# Patient Record
Sex: Male | Born: 1955 | Race: Black or African American | Hispanic: No | Marital: Married | Smoking: Former smoker
Health system: Southern US, Community
[De-identification: ages and names within clinical notes are randomized; demographics above are authoritative.]

## PROBLEM LIST (undated history)

## (undated) DIAGNOSIS — G629 Polyneuropathy, unspecified: Secondary | ICD-10-CM

## (undated) DIAGNOSIS — G4710 Hypersomnia, unspecified: Secondary | ICD-10-CM

## (undated) DIAGNOSIS — E669 Obesity, unspecified: Secondary | ICD-10-CM

## (undated) DIAGNOSIS — R7301 Impaired fasting glucose: Secondary | ICD-10-CM

## (undated) DIAGNOSIS — F528 Other sexual dysfunction not due to a substance or known physiological condition: Secondary | ICD-10-CM

## (undated) DIAGNOSIS — IMO0002 Reserved for concepts with insufficient information to code with codable children: Secondary | ICD-10-CM

## (undated) DIAGNOSIS — M25569 Pain in unspecified knee: Secondary | ICD-10-CM

## (undated) DIAGNOSIS — E739 Lactose intolerance, unspecified: Secondary | ICD-10-CM

## (undated) DIAGNOSIS — R509 Fever, unspecified: Secondary | ICD-10-CM

## (undated) DIAGNOSIS — K649 Unspecified hemorrhoids: Secondary | ICD-10-CM

## (undated) DIAGNOSIS — M549 Dorsalgia, unspecified: Secondary | ICD-10-CM

## (undated) DIAGNOSIS — M545 Low back pain: Secondary | ICD-10-CM

## (undated) DIAGNOSIS — K219 Gastro-esophageal reflux disease without esophagitis: Secondary | ICD-10-CM

## (undated) DIAGNOSIS — K602 Anal fissure, unspecified: Secondary | ICD-10-CM

## (undated) DIAGNOSIS — E785 Hyperlipidemia, unspecified: Secondary | ICD-10-CM

## (undated) DIAGNOSIS — R55 Syncope and collapse: Secondary | ICD-10-CM

## (undated) DIAGNOSIS — M109 Gout, unspecified: Secondary | ICD-10-CM

## (undated) DIAGNOSIS — I10 Essential (primary) hypertension: Secondary | ICD-10-CM

## (undated) DIAGNOSIS — K5790 Diverticulosis of intestine, part unspecified, without perforation or abscess without bleeding: Secondary | ICD-10-CM

## (undated) DIAGNOSIS — G4730 Sleep apnea, unspecified: Secondary | ICD-10-CM

## (undated) DIAGNOSIS — L03317 Cellulitis of buttock: Secondary | ICD-10-CM

## (undated) DIAGNOSIS — L0231 Cutaneous abscess of buttock: Secondary | ICD-10-CM

## (undated) HISTORY — DX: Other sexual dysfunction not due to a substance or known physiological condition: F52.8

## (undated) HISTORY — DX: Low back pain: M54.5

## (undated) HISTORY — DX: Cellulitis of buttock: L03.317

## (undated) HISTORY — DX: Hypersomnia, unspecified: G47.10

## (undated) HISTORY — DX: Lactose intolerance, unspecified: E73.9

## (undated) HISTORY — DX: Syncope and collapse: R55

## (undated) HISTORY — PX: COLONOSCOPY: SHX174

## (undated) HISTORY — DX: Essential (primary) hypertension: I10

## (undated) HISTORY — DX: Reserved for concepts with insufficient information to code with codable children: IMO0002

## (undated) HISTORY — DX: Hyperlipidemia, unspecified: E78.5

## (undated) HISTORY — DX: Fever, unspecified: R50.9

## (undated) HISTORY — PX: HEMORRHOID SURGERY: SHX153

## (undated) HISTORY — DX: Cutaneous abscess of buttock: L02.31

## (undated) HISTORY — DX: Unspecified hemorrhoids: K64.9

## (undated) HISTORY — DX: Dorsalgia, unspecified: M54.9

## (undated) HISTORY — DX: Obesity, unspecified: E66.9

## (undated) HISTORY — DX: Gastro-esophageal reflux disease without esophagitis: K21.9

## (undated) HISTORY — DX: Pain in unspecified knee: M25.569

## (undated) HISTORY — DX: Anal fissure, unspecified: K60.2

## (undated) HISTORY — DX: Gout, unspecified: M10.9

## (undated) HISTORY — DX: Polyneuropathy, unspecified: G62.9

## (undated) HISTORY — DX: Sleep apnea, unspecified: G47.30

## (undated) HISTORY — PX: OTHER SURGICAL HISTORY: SHX169

## (undated) HISTORY — DX: Diverticulosis of intestine, part unspecified, without perforation or abscess without bleeding: K57.90

## (undated) HISTORY — DX: Impaired fasting glucose: R73.01

---

## 1998-05-03 ENCOUNTER — Ambulatory Visit (HOSPITAL_COMMUNITY): Admission: RE | Admit: 1998-05-03 | Discharge: 1998-05-03 | Payer: Self-pay | Admitting: Family Medicine

## 1998-05-03 ENCOUNTER — Encounter: Payer: Self-pay | Admitting: Family Medicine

## 1998-09-06 ENCOUNTER — Ambulatory Visit (HOSPITAL_BASED_OUTPATIENT_CLINIC_OR_DEPARTMENT_OTHER): Admission: RE | Admit: 1998-09-06 | Discharge: 1998-09-06 | Payer: Self-pay | Admitting: Surgery

## 1998-11-01 ENCOUNTER — Ambulatory Visit (HOSPITAL_BASED_OUTPATIENT_CLINIC_OR_DEPARTMENT_OTHER): Admission: RE | Admit: 1998-11-01 | Discharge: 1998-11-01 | Payer: Self-pay | Admitting: Surgery

## 2000-03-09 ENCOUNTER — Encounter: Payer: Self-pay | Admitting: *Deleted

## 2000-03-09 ENCOUNTER — Inpatient Hospital Stay (HOSPITAL_COMMUNITY): Admission: EM | Admit: 2000-03-09 | Discharge: 2000-03-10 | Payer: Self-pay | Admitting: Emergency Medicine

## 2000-03-10 ENCOUNTER — Encounter: Payer: Self-pay | Admitting: *Deleted

## 2002-05-24 ENCOUNTER — Encounter: Payer: Self-pay | Admitting: Family Medicine

## 2002-05-24 ENCOUNTER — Encounter: Admission: RE | Admit: 2002-05-24 | Discharge: 2002-05-24 | Payer: Self-pay | Admitting: Family Medicine

## 2003-08-08 LAB — HM COLONOSCOPY

## 2003-10-12 ENCOUNTER — Ambulatory Visit (HOSPITAL_COMMUNITY): Admission: RE | Admit: 2003-10-12 | Discharge: 2003-10-12 | Payer: Self-pay | Admitting: Gastroenterology

## 2004-10-28 ENCOUNTER — Ambulatory Visit: Payer: Self-pay | Admitting: Internal Medicine

## 2004-10-30 ENCOUNTER — Ambulatory Visit: Payer: Self-pay | Admitting: Internal Medicine

## 2005-04-18 ENCOUNTER — Ambulatory Visit: Payer: Self-pay | Admitting: Internal Medicine

## 2005-05-07 ENCOUNTER — Ambulatory Visit: Payer: Self-pay | Admitting: Internal Medicine

## 2005-05-13 ENCOUNTER — Ambulatory Visit: Payer: Self-pay | Admitting: Internal Medicine

## 2005-09-01 ENCOUNTER — Ambulatory Visit: Payer: Self-pay | Admitting: Internal Medicine

## 2005-09-02 ENCOUNTER — Ambulatory Visit (HOSPITAL_COMMUNITY): Admission: RE | Admit: 2005-09-02 | Discharge: 2005-09-02 | Payer: Self-pay | Admitting: Internal Medicine

## 2005-12-15 ENCOUNTER — Ambulatory Visit: Payer: Self-pay | Admitting: Internal Medicine

## 2005-12-15 LAB — CONVERTED CEMR LAB
ALT: 24 units/L (ref 0–40)
AST: 28 units/L (ref 0–37)
Albumin: 4.1 g/dL (ref 3.5–5.2)
Alkaline Phosphatase: 66 units/L (ref 39–117)
BUN: 17 mg/dL (ref 6–23)
Basophils Absolute: 0.3 10*3/uL — ABNORMAL HIGH (ref 0.0–0.1)
Basophils Relative: 3.3 % — ABNORMAL HIGH (ref 0.0–1.0)
Bilirubin Urine: NEGATIVE
CO2: 30 meq/L (ref 19–32)
Calcium: 9.5 mg/dL (ref 8.4–10.5)
Chloride: 102 meq/L (ref 96–112)
Chol/HDL Ratio, serum: 3.9
Cholesterol: 211 mg/dL (ref 0–200)
Creatinine, Ser: 1.1 mg/dL (ref 0.4–1.5)
Eosinophil percent: 1.9 % (ref 0.0–5.0)
GFR calc non Af Amer: 76 mL/min
Glomerular Filtration Rate, Af Am: 92 mL/min/{1.73_m2}
Glucose, Bld: 114 mg/dL — ABNORMAL HIGH (ref 70–99)
HCT: 39.9 % (ref 39.0–52.0)
HDL: 54.7 mg/dL (ref >39.0)
Hemoglobin, Urine: NEGATIVE
Hemoglobin: 13.8 g/dL (ref 13.0–17.0)
Hgb A1c MFr Bld: 4.7 % (ref 4.6–6.0)
Ketones, ur: NEGATIVE mg/dL
LDL DIRECT: 124 mg/dL
Leukocytes, UA: NEGATIVE
Lymphocytes Relative: 26.7 % (ref 12.0–46.0)
MCHC: 34.6 g/dL (ref 30.0–36.0)
MCV: 99.3 fL (ref 78.0–100.0)
Monocytes Absolute: 0.5 10*3/uL (ref 0.2–0.7)
Monocytes Relative: 6.6 % (ref 3.0–11.0)
Neutro Abs: 5.1 10*3/uL (ref 1.4–7.7)
Neutrophils Relative %: 61.5 % (ref 43.0–77.0)
Nitrite: NEGATIVE
PSA: 0.71 ng/mL
PSA: 0.71 ng/mL (ref 0.10–4.00)
Platelets: 243 10*3/uL (ref 150–400)
Potassium: 4.1 meq/L (ref 3.5–5.1)
RBC: 4.02 M/uL — ABNORMAL LOW (ref 4.22–5.81)
RDW: 11.7 % (ref 11.5–14.6)
Sodium: 138 meq/L (ref 135–145)
Specific Gravity, Urine: 1.01 (ref 1.000–1.03)
TSH: 2.08 microintl units/mL (ref 0.35–5.50)
Total Bilirubin: 1.4 mg/dL — ABNORMAL HIGH (ref 0.3–1.2)
Total Protein, Urine: NEGATIVE mg/dL
Total Protein: 6.5 g/dL (ref 6.0–8.3)
Triglyceride fasting, serum: 221 mg/dL (ref 0–149)
Urine Glucose: NEGATIVE mg/dL
Urobilinogen, UA: 0.2 (ref 0.0–1.0)
VLDL: 44 mg/dL — ABNORMAL HIGH (ref 0–40)
WBC: 8.3 10*3/uL (ref 4.5–10.5)
pH: 7 (ref 5.0–8.0)

## 2006-09-29 ENCOUNTER — Encounter: Payer: Self-pay | Admitting: Internal Medicine

## 2006-09-29 DIAGNOSIS — E785 Hyperlipidemia, unspecified: Secondary | ICD-10-CM | POA: Insufficient documentation

## 2006-09-29 DIAGNOSIS — IMO0002 Reserved for concepts with insufficient information to code with codable children: Secondary | ICD-10-CM | POA: Insufficient documentation

## 2006-09-29 DIAGNOSIS — K219 Gastro-esophageal reflux disease without esophagitis: Secondary | ICD-10-CM | POA: Insufficient documentation

## 2006-09-29 DIAGNOSIS — I10 Essential (primary) hypertension: Secondary | ICD-10-CM | POA: Insufficient documentation

## 2006-09-29 DIAGNOSIS — E669 Obesity, unspecified: Secondary | ICD-10-CM | POA: Insufficient documentation

## 2006-09-29 DIAGNOSIS — K649 Unspecified hemorrhoids: Secondary | ICD-10-CM | POA: Insufficient documentation

## 2006-09-29 HISTORY — DX: Hyperlipidemia, unspecified: E78.5

## 2006-09-29 HISTORY — DX: Unspecified hemorrhoids: K64.9

## 2006-09-29 HISTORY — DX: Gastro-esophageal reflux disease without esophagitis: K21.9

## 2006-09-29 HISTORY — DX: Obesity, unspecified: E66.9

## 2006-09-29 HISTORY — DX: Reserved for concepts with insufficient information to code with codable children: IMO0002

## 2006-09-29 HISTORY — DX: Essential (primary) hypertension: I10

## 2006-12-31 ENCOUNTER — Ambulatory Visit: Payer: Self-pay | Admitting: Internal Medicine

## 2006-12-31 DIAGNOSIS — M545 Low back pain, unspecified: Secondary | ICD-10-CM

## 2006-12-31 DIAGNOSIS — E739 Lactose intolerance, unspecified: Secondary | ICD-10-CM | POA: Insufficient documentation

## 2006-12-31 DIAGNOSIS — F528 Other sexual dysfunction not due to a substance or known physiological condition: Secondary | ICD-10-CM

## 2006-12-31 HISTORY — DX: Low back pain: M54.5

## 2006-12-31 HISTORY — DX: Other sexual dysfunction not due to a substance or known physiological condition: F52.8

## 2006-12-31 HISTORY — DX: Lactose intolerance, unspecified: E73.9

## 2006-12-31 HISTORY — DX: Low back pain, unspecified: M54.50

## 2007-01-01 ENCOUNTER — Ambulatory Visit: Payer: Self-pay | Admitting: Internal Medicine

## 2007-01-01 DIAGNOSIS — R7301 Impaired fasting glucose: Secondary | ICD-10-CM | POA: Insufficient documentation

## 2007-01-01 HISTORY — DX: Impaired fasting glucose: R73.01

## 2007-01-01 LAB — CONVERTED CEMR LAB
Albumin: 4.2 g/dL (ref 3.5–5.2)
Alkaline Phosphatase: 51 units/L (ref 39–117)
BUN: 14 mg/dL (ref 6–23)
Basophils Absolute: 0 10*3/uL (ref 0.0–0.1)
Bilirubin Urine: NEGATIVE
Cholesterol: 229 mg/dL (ref 0–200)
Direct LDL: 155.8 mg/dL
Eosinophils Absolute: 0.2 10*3/uL (ref 0.0–0.6)
GFR calc Af Amer: 92 mL/min
HDL: 48 mg/dL (ref >39.0)
Hemoglobin, Urine: NEGATIVE
Hemoglobin: 13.6 g/dL (ref 13.0–17.0)
Leukocytes, UA: NEGATIVE
Lymphocytes Relative: 35 % (ref 12.0–46.0)
MCHC: 34.7 g/dL (ref 30.0–36.0)
MCV: 98.6 fL (ref 78.0–100.0)
Monocytes Absolute: 0.5 10*3/uL (ref 0.2–0.7)
Monocytes Relative: 8.5 % (ref 3.0–11.0)
Neutro Abs: 3.2 10*3/uL (ref 1.4–7.7)
Neutrophils Relative %: 52.3 % (ref 43.0–77.0)
PSA: 0.79 ng/mL (ref 0.10–4.00)
Potassium: 3.7 meq/L (ref 3.5–5.1)
Sodium: 141 meq/L (ref 135–145)
TSH: 1.53 microintl units/mL (ref 0.35–5.50)
VLDL: 31 mg/dL (ref 0–40)
pH: 7 (ref 5.0–8.0)

## 2007-02-25 ENCOUNTER — Ambulatory Visit: Payer: Self-pay | Admitting: Internal Medicine

## 2007-06-08 ENCOUNTER — Ambulatory Visit: Payer: Self-pay | Admitting: Internal Medicine

## 2007-06-08 DIAGNOSIS — R509 Fever, unspecified: Secondary | ICD-10-CM

## 2007-06-08 HISTORY — DX: Fever, unspecified: R50.9

## 2008-01-28 HISTORY — PX: OTHER SURGICAL HISTORY: SHX169

## 2008-03-14 ENCOUNTER — Ambulatory Visit: Payer: Self-pay | Admitting: Internal Medicine

## 2008-03-14 DIAGNOSIS — R55 Syncope and collapse: Secondary | ICD-10-CM | POA: Insufficient documentation

## 2008-03-14 HISTORY — DX: Syncope and collapse: R55

## 2008-04-14 ENCOUNTER — Ambulatory Visit: Payer: Self-pay | Admitting: Internal Medicine

## 2008-04-14 LAB — CONVERTED CEMR LAB
ALT: 42 units/L (ref 0–53)
Alkaline Phosphatase: 57 units/L (ref 39–117)
Bilirubin, Direct: 0.2 mg/dL (ref 0.0–0.3)
Cholesterol: 166 mg/dL (ref 0–200)
Total Bilirubin: 1.2 mg/dL (ref 0.3–1.2)
Total Protein: 7 g/dL (ref 6.0–8.3)
VLDL: 29.6 mg/dL (ref 0.0–40.0)

## 2008-04-26 ENCOUNTER — Telehealth: Payer: Self-pay | Admitting: Internal Medicine

## 2008-10-18 ENCOUNTER — Ambulatory Visit: Payer: Self-pay | Admitting: Internal Medicine

## 2008-10-18 DIAGNOSIS — M25569 Pain in unspecified knee: Secondary | ICD-10-CM | POA: Insufficient documentation

## 2008-10-18 DIAGNOSIS — M109 Gout, unspecified: Secondary | ICD-10-CM | POA: Insufficient documentation

## 2008-10-18 HISTORY — DX: Pain in unspecified knee: M25.569

## 2008-10-18 HISTORY — DX: Gout, unspecified: M10.9

## 2009-04-20 ENCOUNTER — Ambulatory Visit: Payer: Self-pay | Admitting: Internal Medicine

## 2009-04-20 LAB — CONVERTED CEMR LAB
ALT: 52 units/L (ref 0–53)
AST: 41 units/L — ABNORMAL HIGH (ref 0–37)
Alkaline Phosphatase: 59 units/L (ref 39–117)
BUN: 14 mg/dL (ref 6–23)
Basophils Relative: 0.6 % (ref 0.0–3.0)
Bilirubin, Direct: 0.1 mg/dL (ref 0.0–0.3)
CO2: 28 meq/L (ref 19–32)
Calcium: 9.4 mg/dL (ref 8.4–10.5)
Eosinophils Relative: 2.7 % (ref 0.0–5.0)
Glucose, Bld: 104 mg/dL — ABNORMAL HIGH (ref 70–99)
HDL: 44.2 mg/dL (ref >39.00)
Ketones, ur: NEGATIVE mg/dL
Leukocytes, UA: NEGATIVE
Monocytes Relative: 9.7 % (ref 3.0–12.0)
Neutrophils Relative %: 56.8 % (ref 43.0–77.0)
Platelets: 230 10*3/uL (ref 150.0–400.0)
RBC: 3.72 M/uL — ABNORMAL LOW (ref 4.22–5.81)
Sodium: 141 meq/L (ref 135–145)
Specific Gravity, Urine: 1.01 (ref 1.000–1.030)
Total Bilirubin: 0.9 mg/dL (ref 0.3–1.2)
Total CHOL/HDL Ratio: 4
Total Protein, Urine: NEGATIVE mg/dL
Urine Glucose: NEGATIVE mg/dL
VLDL: 31.6 mg/dL (ref 0.0–40.0)
WBC: 6.2 10*3/uL (ref 4.5–10.5)
pH: 7 (ref 5.0–8.0)

## 2009-04-30 ENCOUNTER — Ambulatory Visit: Payer: Self-pay | Admitting: Internal Medicine

## 2009-04-30 DIAGNOSIS — G4710 Hypersomnia, unspecified: Secondary | ICD-10-CM

## 2009-04-30 HISTORY — DX: Hypersomnia, unspecified: G47.10

## 2009-05-29 ENCOUNTER — Ambulatory Visit: Payer: Self-pay | Admitting: Pulmonary Disease

## 2009-05-29 DIAGNOSIS — G4733 Obstructive sleep apnea (adult) (pediatric): Secondary | ICD-10-CM | POA: Insufficient documentation

## 2009-06-14 ENCOUNTER — Ambulatory Visit: Payer: Self-pay | Admitting: Internal Medicine

## 2009-06-14 DIAGNOSIS — L03317 Cellulitis of buttock: Secondary | ICD-10-CM

## 2009-06-14 DIAGNOSIS — L0231 Cutaneous abscess of buttock: Secondary | ICD-10-CM | POA: Insufficient documentation

## 2009-06-14 HISTORY — DX: Cutaneous abscess of buttock: L02.31

## 2009-08-09 ENCOUNTER — Ambulatory Visit (HOSPITAL_BASED_OUTPATIENT_CLINIC_OR_DEPARTMENT_OTHER): Admission: RE | Admit: 2009-08-09 | Discharge: 2009-08-09 | Payer: Self-pay | Admitting: Pulmonary Disease

## 2009-08-09 ENCOUNTER — Encounter: Payer: Self-pay | Admitting: Pulmonary Disease

## 2009-08-28 ENCOUNTER — Ambulatory Visit: Payer: Self-pay | Admitting: Pulmonary Disease

## 2009-08-30 ENCOUNTER — Telehealth (INDEPENDENT_AMBULATORY_CARE_PROVIDER_SITE_OTHER): Payer: Self-pay | Admitting: *Deleted

## 2009-09-05 ENCOUNTER — Ambulatory Visit: Payer: Self-pay | Admitting: Pulmonary Disease

## 2009-10-08 ENCOUNTER — Encounter: Payer: Self-pay | Admitting: Pulmonary Disease

## 2009-10-23 ENCOUNTER — Telehealth: Payer: Self-pay | Admitting: Internal Medicine

## 2010-01-16 ENCOUNTER — Ambulatory Visit: Payer: Self-pay | Admitting: Internal Medicine

## 2010-01-16 ENCOUNTER — Encounter (INDEPENDENT_AMBULATORY_CARE_PROVIDER_SITE_OTHER): Payer: Self-pay | Admitting: *Deleted

## 2010-01-16 DIAGNOSIS — M549 Dorsalgia, unspecified: Secondary | ICD-10-CM | POA: Insufficient documentation

## 2010-01-16 HISTORY — DX: Dorsalgia, unspecified: M54.9

## 2010-02-24 LAB — CONVERTED CEMR LAB
Albumin: 4.6 g/dL (ref 3.5–5.2)
Alkaline Phosphatase: 56 units/L (ref 39–117)
BUN: 15 mg/dL (ref 6–23)
Basophils Relative: 0.1 % (ref 0.0–3.0)
Calcium: 9.8 mg/dL (ref 8.4–10.5)
Creatinine, Ser: 1.2 mg/dL (ref 0.4–1.5)
Direct LDL: 143.6 mg/dL
Eosinophils Absolute: 0.2 10*3/uL (ref 0.0–0.7)
Eosinophils Relative: 1.9 % (ref 0.0–5.0)
GFR calc Af Amer: 82 mL/min
GFR calc non Af Amer: 68 mL/min
Glucose, Bld: 96 mg/dL (ref 70–99)
HCT: 36.1 % — ABNORMAL LOW (ref 39.0–52.0)
HDL: 47.7 mg/dL (ref >39.0)
Hemoglobin, Urine: NEGATIVE
Hemoglobin: 13 g/dL (ref 13.0–17.0)
MCV: 97.5 fL (ref 78.0–100.0)
Monocytes Absolute: 0.8 10*3/uL (ref 0.1–1.0)
Monocytes Relative: 10 % (ref 3.0–12.0)
Neutro Abs: 4.4 10*3/uL (ref 1.4–7.7)
Nitrite: NEGATIVE
PSA: 0.79 ng/mL (ref 0.10–4.00)
Platelets: 225 10*3/uL (ref 150–400)
Potassium: 3.2 meq/L — ABNORMAL LOW (ref 3.5–5.1)
TSH: 5.14 microintl units/mL (ref 0.35–5.50)
Total Protein, Urine: NEGATIVE mg/dL
Total Protein: 7.3 g/dL (ref 6.0–8.3)
Triglycerides: 176 mg/dL — ABNORMAL HIGH (ref 0–149)
Urine Glucose: NEGATIVE mg/dL
WBC: 8.1 10*3/uL (ref 4.5–10.5)
pH: 6 (ref 5.0–8.0)

## 2010-02-26 NOTE — Progress Notes (Signed)
Summary: need ov with kc   Phone Note Outgoing Call   Call placed by: Arman Filter LPN,  August 30, 2009 2:27 PM Call placed to: Patient Summary of Call: per [facility], pt needs ov with KC next week to discuss sleep study results.  Aundra Millet Reynolds LPN  August 30, 2009 2:28 PM  Initial call taken by: Arman Filter LPN,  August 30, 2009 2:28 PM  Follow-up for Phone Call        Appt sched for 09/05/09 at 4:15  Vernie Murders  August 30, 2009 2:41 PM

## 2010-02-26 NOTE — Progress Notes (Signed)
Summary: Rx refill req  Phone Note Refill Request Message from:  Patient on October 23, 2009 10:16 AM  Refills Requested: Medication #1:  PRILOSEC 40 MG CPDR Take 1 capsule by mouth once a day   Dosage confirmed as above?Dosage Confirmed   Supply Requested: 6 months [facility] [address] did not recieve Rx in April   Method Requested: Electronic Initial call taken by: Margaret Pyle, CMA,  October 23, 2009 10:17 AM    Prescriptions: PRILOSEC 40 MG CPDR (OMEPRAZOLE) Take 1 capsule by mouth once a day  #60 x 5   Entered by:   Margaret Pyle, CMA   Authorized by:   Corwin Levins MD   Signed by:   Margaret Pyle, CMA on 10/23/2009   Method used:   [facility] to        [facility]  [address] [id number]       [address]       [place], [place]  [address]       Ph: [phone] or [phone]       Fax: [phone]   RxID:   [id number]

## 2010-02-26 NOTE — Assessment & Plan Note (Signed)
Summary: discuss sleep study results//lmr   Copy to:  Dr. Oliver Barre Primary Provider/Referring Provider:  Dr. Oliver Barre  CC:  Pt is here for a f/u appt to discuss sleep study results..  History of Present Illness: The pt comes in today for f/u of his recent sleep study.  He was found to have severe osa, with AHI of 79/hr and desat as low as 80%.  I have gone over the study in detail with him, and answered all of his questions.    Current Medications (verified): 1)  Cialis 20 Mg Tabs (Tadalafil) .... Take 1 Tablet By Mouth Once A Day 2)  Lisinopril-Hydrochlorothiazide 20-12.5 Mg Tabs (Lisinopril-Hydrochlorothiazide) .... Take 2 Tablet By Mouth Once A Day 3)  Prilosec 40 Mg Cpdr (Omeprazole) .... Take 1 Capsule By Mouth Once A Day 4)  Ecotrin Low Strength 81 Mg  Tbec (Aspirin) .[name] Kitchen.. 1 By Mouth Qd 5)  Fenofibrate 160 Mg Tabs (Fenofibrate) .[name] Kitchen.. 1po Once Daily 6)  Hydrocodone-Acetaminophen 5-325 Mg Tabs (Hydrocodone-Acetaminophen) .[name] Kitchen.. 1-2 By Mouth Two Times A Day As Needed Pain  Allergies (verified): No Known Drug Allergies  Review of Systems  The patient denies shortness of breath with activity, shortness of breath at rest, productive cough, non-productive cough, coughing up blood, chest pain, irregular heartbeats, acid heartburn, indigestion, loss of appetite, weight change, abdominal pain, difficulty swallowing, sore throat, tooth/dental problems, headaches, nasal congestion/difficulty breathing through nose, sneezing, itching, ear ache, anxiety, depression, hand/feet swelling, joint stiffness or pain, rash, change in color of mucus, and fever.    Vital Signs:  Patient profile:   55 year old male Height:      74 inches Weight:      302.38 pounds BMI:     38.96 O2 Sat:      96 % on Room air Temp:     979 degrees F oral Pulse rate:   66 / minute BP sitting:   122 / 68  (left arm) Cuff size:   large  Vitals Entered By: Arman Filter LPN (September 05, 2009 4:00 PM)  O2 Flow:  Room  air CC: Pt is here for a f/u appt to discuss sleep study results. Comments Medications reviewed with patient  Arman Filter LPN  September 05, 2009 4:07 PM      Physical Exam  General:  obese male in nad Lungs:  clear to auscultation Extremities:  no edema or cyanosis  Neurologic:  alert and oriented, moves all 4.   Impression & Recommendations:  Problem # 1:  OBSTRUCTIVE SLEEP APNEA (ICD-327.23) the pt has severe osa by his recent sleep study, and will need treatment with cpap while working on weight loss.  He is agreeable to trying cpap.  I will set the patient up on cpap at a moderate pressure level to allow for desensitization, and will troubleshoot the device over the next 4-6weeks if needed.  The pt is to call me if having issues with tolerance.  Will then optimize the pressure once patient is able to wear cpap on a consistent basis.  Other Orders: Est. Patient Level III ([address]) DME Referral (DME)  Patient Instructions: 1)  will start on cpap at moderate pressure level.  Please call if having issues with tolerance 2)  work on weight loss 3)  followup with me in 5 weeks.

## 2010-02-26 NOTE — Assessment & Plan Note (Signed)
Summary: consult for hypersomnia and probable osa   Copy to:  Dr. Oliver Barre Primary Provider/Referring Provider:  Dr. Oliver Barre  CC:  Sleep Consult.  [name] Kitchen  History of Present Illness: The pt is a 54y/o male who I have been asked to see for hypersomnia and possible osa.  He has been noted to have loud snoring according to his wife, as well as pauses in his breathing during sleep.  He has also noted choking arousals at times.  He goes to bed at 9pm, and arises at 4:30 am to start his day.  He is unrested most mornings, and has frequent awakenings at night.  He notes significant sleep pressure with any period of inactivity during the day, including working at computer or doing paperwork.  He will fall asleep easily with movies or tv in evening.  He has no sleepiness issues with driving shorter distances, but can get some sleep pressure with longer distances.  He tells me that his weight is up 75 pounds in the last one year, and his epworth score today is 8.  Current Medications (verified): 1)  Cialis 20 Mg Tabs (Tadalafil) .... Take 1 Tablet By Mouth Once A Day 2)  Lisinopril-Hydrochlorothiazide 20-12.5 Mg Tabs (Lisinopril-Hydrochlorothiazide) .... Take 2 Tablet By Mouth Once A Day 3)  Prilosec 40 Mg Cpdr (Omeprazole) .... Take 1 Capsule By Mouth Once A Day 4)  Ecotrin Low Strength 81 Mg  Tbec (Aspirin) .[name] Kitchen.. 1 By Mouth Qd 5)  Fenofibrate 160 Mg Tabs (Fenofibrate) .[name] Kitchen.. 1po Once Daily  Allergies (verified): No Known Drug Allergies  Past History:  Past Medical History: Reviewed history from 04/30/2009 and no changes required. GERD Hyperlipidemia Hypertension Obesity Hemorrhoids H/O Vertebral Fx glucose intolerance Low Back pain/Lumbar radiculopathy E.D. chronic anal fissure  - Dr Orland Dec  Past Surgical History: Reviewed history from 09/29/2006 and no changes required. Hemorrhoidectomy  Family History: Reviewed history from 12/31/2006 and no changes required. Father with  colon and prostate cancer allergies - sister CHF - father  Social History: Reviewed history from 03/14/2008 and no changes required. Former Smoker. Quit 1999.  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  The patient denies productive cough, non-productive cough, coughing up blood, chest pain, irregular heartbeats, abdominal pain, difficulty swallowing, sore throat, tooth/dental problems, headaches, itching, ear ache, anxiety, depression, hand/feet swelling, rash, change in color of mucus, and fever.    Vital Signs:  Patient profile:   55 year old male Height:      74 inches Weight:      304.13 pounds BMI:     39.19 O2 Sat:      98 % on Room air Temp:     98.5 degrees F oral Pulse rate:   72 / minute BP sitting:   120 / 78  (right arm) Cuff size:   large  Vitals Entered By: Gweneth Dimitri RN (May 29, 2009 11:18 AM)  O2 Flow:  Room air CC: Sleep Consult.   Comments Medications reviewed with patient Daytime contact number verified with patient. Gweneth Dimitri RN  May 29, 2009 11:18 AM    Physical Exam  General:  obese male in  nad Eyes:  PERRLA and EOMI.   Nose:  turbinate hypertrophy, but patent bilat  Mouth:  elongation of soft palate, uvula ok Neck:  large neck, no jvd, tmg, LN Lungs:  clear to auscultation Heart:  rrr, no mrg Abdomen:  soft and nontender, bs+ Extremities:  no edema or cyanosis pulses intact distally Neurologic:  alert and oriented, moves all 4.   Impression & Recommendations:  Problem # 1:  OBSTRUCTIVE SLEEP APNEA (ICD-327.23) the pt's history is classic for osa.  He is obese, has loud snoring and pauses per wife, has  nonrestorative sleep, EDS, and has gained 75 pounds of weight in the last one year.  I have had a long discussion with the pt about sleep apnea, including its impact on his QOL and CV health.  He needs a sleep study for diagnosis, followed by appropriate treatment.  Other Orders: Consultation Level IV (63016) Sleep Disorder Referral (Sleep Disorder)  Patient Instructions: 1)  will set up for sleep study, and will arrange followup once results available. 2)  work on weight loss

## 2010-02-26 NOTE — Assessment & Plan Note (Signed)
Summary: PHYSICAL--STC   Vital Signs:  Patient profile:   55 year old male Height:      74 inches Weight:      303 pounds BMI:     39.04 O2 Sat:      97 % on Room air Temp:     98.3 degrees F oral Pulse rate:   70 / minute BP sitting:   120 / 62  (left arm) Cuff size:   large  Vitals Entered ByZella Ball Ewing (April 30, 2009 10:46 AM)  O2 Flow:  Room air  CC: Adult Physical/RE   CC:  Adult Physical/RE.  History of Present Illness: has recurrent right low back pain and known radicular problems , rec'd for surgury but he has declined; so when pain recurs, the slow down that day, and trying not to take pain meds as much as possible;  not walking as much and now gaining wt as well (also eating too much as [place] per pt) ; now at his peak wt today - 303; wife (not here today) thinks he has sleep apnea with snoring at night and wakes up at night, and with near daily daytime somnolence (worse laster in the week) - works 10 hr days.  Today not sleepy but tries to fall asleep at work typing sometimes.  Pt denies CP, sob, doe, wheezing, orthopnea, pnd, worsening LE edema, palps, dizziness or syncope  Pt denies new neuro symptoms such as headache, facial or extremity weakness  Daughter says he holds his breath while sleeping.  Not taking the K pill and statin now due to cost .    Problems Prior to Update: 1)  Hypersomnia  (ICD-780.54) 2)  Knee Pain, Left  (ICD-719.46) 3)  Acute Gouty Arthropathy  (ICD-274.01) 4)  Syncope, Vasovagal  (ICD-780.2) 5)  Preventive Health Care  (ICD-V70.0) 6)  Fever Unspecified  (ICD-780.60) 7)  Special Screening Malignant Neoplasm of Prostate  (ICD-V76.44) 8)  Impaired Fasting Glucose  (ICD-790.21) 9)  Preventive Health Care  (ICD-V70.0) 10)  Erectile Dysfunction  (ICD-302.72) 11)  Low Back Pain  (ICD-724.2) 12)  Glucose Intolerance  (ICD-271.3) 13)  Vertebral Fracture  (ICD-805.8) 14)  Hemorrhoids  (ICD-455.6) 15)  Obesity  (ICD-278.00) 16)   Hypertension  (ICD-401.9) 17)  Hyperlipidemia  (ICD-272.4) 18)  Gerd  (ICD-530.81)  Medications Prior to Update: 1)  Cialis 20 Mg Tabs (Tadalafil) .... Take 1 Tablet By Mouth Once A Day 2)  Lisinopril-Hydrochlorothiazide 20-12.5 Mg Tabs (Lisinopril-Hydrochlorothiazide) .... Take 2 Tablet By Mouth Once A Day 3)  Prilosec 40 Mg Cpdr (Omeprazole) .... Take 1 Capsule By Mouth Once A Day 4)  Ecotrin Low Strength 81 Mg  Tbec (Aspirin) .[name] Kitchen.. 1 By Mouth Qd 5)  Tricor 145 Mg  Tabs (Fenofibrate) .[name] Kitchen.. 1po Qd 6)  Simvastatin 40 Mg Tabs (Simvastatin) .[name] Kitchen.. 1 By Mouth Once Daily 7)  Klor-Con 10 10 Meq Cr-Tabs (Potassium Chloride) .[name] Kitchen.. 1 By Mouth Once Daily 8)  Prednisone 10 Mg Tabs (Prednisone) .... 4po Qd For 3days, Then 3po Qd For 3days, Then 2po Qd For 3days, Then 1po Qd For 3 Days, Then Stop 9)  Hydrocodone-Acetaminophen 5-325 Mg Tabs (Hydrocodone-Acetaminophen) .[name] Kitchen.. 1 - 2 By Mouth Q 6 Hrs As Needed Pain 10)  Indomethacin 50 Mg Caps (Indomethacin) .[name] Kitchen.. 1po Three Times A Day  Current Medications (verified): 1)  Cialis 20 Mg Tabs (Tadalafil) .... Take 1 Tablet By Mouth Once A Day 2)  Lisinopril-Hydrochlorothiazide 20-12.5 Mg Tabs (Lisinopril-Hydrochlorothiazide) .... Take 2 Tablet By Mouth Once  A Day 3)  Prilosec 40 Mg Cpdr (Omeprazole) .... Take 1 Capsule By Mouth Once A Day 4)  Ecotrin Low Strength 81 Mg  Tbec (Aspirin) .[name] Kitchen.. 1 By Mouth Qd 5)  Fenofibrate 160 Mg Tabs (Fenofibrate) .[name] Kitchen.. 1po Once Daily 6)  Indomethacin 50 Mg Caps (Indomethacin) .[name] Kitchen.. 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 chronic anal fissure  - Dr Orland Dec  Past Surgical History: Reviewed history from 09/29/2006 and no changes required. Hemorrhoidectomy  Review of Systems  The patient denies anorexia, fever, weight loss, vision loss, decreased hearing, hoarseness, chest pain, syncope, dyspnea on exertion, peripheral edema, prolonged cough, headaches, hemoptysis, abdominal pain, melena, hematochezia, severe indigestion/heartburn, hematuria, muscle weakness, suspicious skin lesions, transient blindness, difficulty walking, depression, unusual weight change, abnormal bleeding, enlarged lymph nodes, and angioedema.         all otherwise negative per pt -  still seeing Dr Gilford Raid for occasional sympt anal fissure   Impression & Recommendations:  Problem # 1:  Preventive Health Care (ICD-V70.0)  Overall doing well, age appropriate education and counseling updated and referral for appropriate preventive services done unless declined, immunizations up to date or declined, diet counseling done if overweight, urged to quit smoking if smokes , most recent labs reviewed and current ordered if appropriate, ecg reviewed or declined (interpretation per ECG scanned in the EMR if done); information regarding Medicare Prevention requirements given if appropriate   Orders: EKG w/ Interpretation (93000)  Problem # 2:  HYPERSOMNIA (ICD-780.54)  to refer pulm, to work on wt loss  Orders: Pulmonary Referral (Pulmonary)  Problem # 3:  HYPERTENSION (ICD-401.9)  His updated medication list for this problem includes:    Lisinopril-hydrochlorothiazide 20-12.5 Mg Tabs (Lisinopril-hydrochlorothiazide) .[name] Kitchen... Take 2 tablet by mouth once a day  BP today: 120/62 Prior BP: 130/78 (10/18/2008)  Labs Reviewed: K+: 4.0 (04/20/2009) Creat: : 1.1 (04/20/2009)   Chol: 186 (04/20/2009)   HDL: 44.20 (04/20/2009)   LDL: 110 (04/20/2009)   TG: 158.0 (04/20/2009) stable  overall by hx and exam, ok to continue meds/tx as is   Problem # 4:  HYPERLIPIDEMIA (ICD-272.4)  The following medications were removed from the medication list:    Simvastatin 40 Mg Tabs (Simvastatin) .[name] Kitchen... 1 by mouth once daily His updated medication list for this problem includes:    Fenofibrate 160 Mg Tabs (Fenofibrate) .[name] Kitchen... 1po once daily  Labs Reviewed: SGOT: 41 (04/20/2009)   SGPT: 52 (04/20/2009)   HDL:44.20 (04/20/2009), 43.60 (04/14/2008)  LDL:110 (04/20/2009), 93 (60/45/4098)  Chol:186 (04/20/2009), 166 (04/14/2008)  Trig:158.0 (04/20/2009), 148.0 (04/14/2008) stable overall by hx and exam, ok to continue meds/tx as is - to change to the generic fenofibrate as above, ok to hold the statin for now, work on lower chol diet, wt loss  Complete Medication List: 1)  Cialis 20 Mg Tabs (Tadalafil) .... Take 1 tablet by mouth once a day 2)  Lisinopril-hydrochlorothiazide 20-12.5 Mg Tabs (Lisinopril-hydrochlorothiazide) .... Take 2 tablet by mouth once a day 3)  Prilosec 40 Mg Cpdr (Omeprazole) .... Take 1 capsule by mouth once a day 4)  Ecotrin Low Strength 81 Mg Tbec (Aspirin) .[name] KitchenMarland KitchenMarland Kitchen  1 by mouth qd 5)  Fenofibrate 160 Mg Tabs (Fenofibrate) .[name] Kitchen.. 1po once daily 6)  Indomethacin 50 Mg Caps (Indomethacin) .[name] Kitchen.. 1po three times a day  Patient Instructions: 1)  You will be contacted about the referral(s) to: Pulmonary 2)  Continue all previous medications as before this visit  3)  Please schedule a follow-up appointment in 1 year with CPX labs Prescriptions: INDOMETHACIN 50 MG CAPS (INDOMETHACIN) 1po three times a day  #60 x 1   Entered and Authorized by:   Corwin Levins MD   Signed by:   Corwin Levins MD on 04/30/2009   Method used:   [facility] to        [facility]  [id number]       [address]       [place], [place]  [address]       Ph: [phone] or [phone]       Fax: [phone]   RxID:   [id number] PRILOSEC 40 MG CPDR (OMEPRAZOLE)  Take 1 capsule by mouth once a day  #60 x 11   Entered and Authorized by:   Corwin Levins MD   Signed by:   Corwin Levins MD on 04/30/2009   Method used:   [facility]        [facility]  [id number]       [address]       [place], [place]  [address]       Ph: [phone] or [phone]       Fax: [phone]   RxID:   [id number] LISINOPRIL-HYDROCHLOROTHIAZIDE 20-12.5 MG TABS (LISINOPRIL-HYDROCHLOROTHIAZIDE) Take 2 tablet by mouth once a day  #60 x 11   Entered and Authorized by:   Corwin Levins MD   Signed by:   Corwin Levins MD on 04/30/2009   Method used:   [facility]        [facility]  [id number]       [address]       [place], [place]  [address]       Ph: [phone] or [phone]       Fax: [phone]   RxID:   [id number] CIALIS 20 MG TABS (TADALAFIL) Take 1 tablet by mouth once a day  #3 x 0   Entered and Authorized by:   Corwin Levins MD   Signed by:   Corwin Levins MD on 04/30/2009   Method used:   Print then Give to Patient   RxID:   [id number] FENOFIBRATE 160 MG TABS (FENOFIBRATE) 1po once daily  #30 x 11   Entered and Authorized by:   Corwin Levins MD   Signed by:   Corwin Levins MD on 04/30/2009   Method used:   [facility]        [id number]       [address]       [place], [place]  [address]       Ph: [phone] or [phone]       Fax: [phone]   RxID:   [id number]

## 2010-02-26 NOTE — Assessment & Plan Note (Signed)
Summary: lance boil per dahlia/john/cd   Vital Signs:  Patient profile:   55 year old male Height:      74 inches Weight:      294.75 pounds BMI:     37.98 O2 Sat:      95 % on Room air Temp:     97.8 degrees F oral Pulse rate:   93 / minute BP sitting:   124 / 70  (left arm) Cuff size:   large  Vitals Entered By: Lucious Groves (Jun 14, 2009 11:14 AM)  O2 Flow:  Room air  Procedure Note Last Tetanus: Tdap (03/14/2008)  Incision & Drainage: The patient complains of pain, redness, and inflammation. Onset of lesion: 2 days Indication: infected lesion Consent signed: yes  Procedure # 1: I & D with packing    Size (in cm): 1.0 x 3.0    Region: medial    Location: L buttock    Comment: Risks including but not limited by incomplete procedure, bleeding, infection, recurrence were discussed with the patient. Consent form was signed.  1 cm incision was made.  30   cc of purulent material was evacuated. Wound cavity was probed w/blunt forceps and cleaned.  7 cm of packing was inserted. Silvadine cream and TELFA pad applied under gause dressing. Tolerated well. Complicatons - none. Good pain relief following the procedure.      Instrument used: #10 blade    Anesthesia: 3.0 ml 1% lidocaine w/epinephrine  Cleaned and prepped with: alcohol and betadine Wound dressing: bulky gauze dressing Instructions: daily dressing changes, out of work, and RTC in 24 hrs  CC: C/O severe pain with boil on rectum, requests lancing./kb Is Patient Diabetic? No Pain Assessment Patient in pain? yes     Location: rectum Intensity: 10 Type: severe   Primary Care Provider:  Dr. Oliver Barre  CC:  C/O severe pain with boil on rectum and requests lancing./kb.  History of Present Illness: C/o L buttock abscess x 2 d, pain and fever  Current Medications (verified): 1)  Cialis 20 Mg Tabs (Tadalafil) .... Take 1 Tablet By Mouth Once A Day 2)  Lisinopril-Hydrochlorothiazide 20-12.5 Mg Tabs  (Lisinopril-Hydrochlorothiazide) .... Take 2 Tablet By Mouth Once A Day 3)  Prilosec 40 Mg Cpdr (Omeprazole) .... Take 1 Capsule By Mouth Once A Day 4)  Ecotrin Low Strength 81 Mg  Tbec (Aspirin) .[name] Kitchen.. 1 By Mouth Qd 5)  Fenofibrate 160 Mg Tabs (Fenofibrate) .[name] Kitchen.. 1po Once Daily  Allergies (verified): No Known Drug Allergies  Past History:  Past Medical History: GERD Hyperlipidemia Hypertension Obesity Hemorrhoids H/O Vertebral Fx glucose intolerance Low Back pain/Lumbar radiculopathy E.D. chronic anal fissure  - Dr Orland Dec Recurrent buttock abscesses  Social History: Reviewed history from 05/29/2009 and no changes required. Former Smoker. Quit 1999.  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  Non-toxic, NAD Lungs:  normal respiratory effort and normal breath sounds.   Heart:  normal rate and regular rhythm.   Abdomen:  Obese, S/NT Skin:  L inner mid-buttock painful raised horisontal area of swelling from perianal area and outward 1x3 cm   Impression & Recommendations:  Problem # 1:  ABSCESS, GLUTEAL (ICD-682.5) Assessment New  His updated medication list for this problem includes:    Doxycycline Hyclate 100 Mg Caps (Doxycycline hyclate) .[name] Kitchen... 1 by mouth two times a day with a glass  of water Dr Gerrit Friends if needed See me in 1-2-3 d if needed Orders: Rocephin  250mg  (Z6109) Admin of Therapeutic Inj  intramuscular or subcutaneous (60454) T-Culture, Wound (87070/87205-70190) I&D Abscess, Complex (10061)  Problem # 2:  IMPAIRED FASTING GLUCOSE (ICD-790.21) Assessment: Comment Only F/u w/Dr Jonny Ruiz w/labs  Loose wt!  Complete Medication List: 1)  Cialis 20 Mg Tabs (Tadalafil) .... Take 1 tablet by mouth once a day 2)  Lisinopril-hydrochlorothiazide 20-12.5 Mg Tabs (Lisinopril-hydrochlorothiazide) .... Take 2 tablet by mouth once a day 3)   Prilosec 40 Mg Cpdr (Omeprazole) .... Take 1 capsule by mouth once a day 4)  Ecotrin Low Strength 81 Mg Tbec (Aspirin) .[name] Kitchen.. 1 by mouth qd 5)  Fenofibrate 160 Mg Tabs (Fenofibrate) .[name] Kitchen.. 1po once daily 6)  Doxycycline Hyclate 100 Mg Caps (Doxycycline hyclate) .[name] Kitchen.. 1 by mouth two times a day with a glass of water 7)  Hydrocodone-acetaminophen 5-325 Mg Tabs (Hydrocodone-acetaminophen) .[name] Kitchen.. 1-2 by mouth two times a day as needed pain 8)  Hibiclens 4 % Liqd (Chlorhexidine gluconate) .... Shower weekly 9)  Silvadene 1 % Crea (Silver sulfadiazine) .... Use bid  Patient Instructions: 1)  Please schedule a follow-up appointment in 3 months. 2)  BMP prior to visit, ICD-9: 3)  HbgA1C prior to visit, ICD-9:790 29 4)  Shower with Hibiclense weekly 5)  Loose weght Prescriptions: SILVADENE 1 % CREA (SILVER SULFADIAZINE) use bid  #60 g x 1   Entered and Authorized by:   Tresa Garter MD   Signed by:   Tresa Garter MD on 06/14/2009   Method used:   Print then Give to Patient   RxID:   [id number] HIBICLENS 4 % LIQD (CHLORHEXIDINE GLUCONATE) shower weekly  #500 ml x 1   Entered and Authorized by:   Tresa Garter MD   Signed by:   Tresa Garter MD on 06/14/2009   Method used:   Print then Give to Patient   RxID:   [id number] HYDROCODONE-ACETAMINOPHEN 5-325 MG TABS (HYDROCODONE-ACETAMINOPHEN) 1-2 by mouth two times a day as needed pain  #60 x 1   Entered and Authorized by:   Tresa Garter MD   Signed by:   Tresa Garter MD on 06/14/2009   Method used:   Print then Give to Patient   RxID:   [id number] DOXYCYCLINE HYCLATE 100 MG CAPS (DOXYCYCLINE HYCLATE) 1 by mouth two times a day with a glass of water  #20 x 3   Entered and Authorized by:   Tresa Garter MD   Signed by:   Tresa Garter MD on 06/14/2009   Method used:   Electronically to        [facility]  [address] [id number] (retail)       [address]       [place], [place]  [address]       Ph: [phone] or [phone]       Fax: [phone]   RxID:   [id number]    Medication Administration  Injection # 1:    Medication: Rocephin  250mg     Diagnosis: ABSCESS, GLUTEAL (ICD-682.5)    Route: IM    Site: LUOQ gluteus    Exp Date: 12/28/2010    Lot #: [id number]    Mfr: NovaPlus    Patient tolerated injection without complications    Given by: Lucious Groves (Jun 14, 2009 1:23 PM)  Orders Added: 1)  Rocephin  250mg  [E8315]  2)  Admin of Therapeutic Inj  intramuscular or subcutaneous [96372] 3)  T-Culture, Wound [87070/[id number]] 4)  Est. Patient Level III [73220] 5)  I&D Abscess, Complex [10061]

## 2010-02-28 NOTE — Letter (Signed)
Summary: Out of Work  [facility]  [address] [place], [place] [address]   Phone: [phone]  Fax: [phone]    January 16, 2010   Employee:  DASHAN CHIZMAR    To Whom It May Concern:   For Medical reasons, please excuse the above named employee from work for the following dates:  Start:   01/15/2010  End:   01/18/2010  If you need additional information, please feel free to contact our office.         Sincerely,    Dr. Oliver Barre

## 2010-02-28 NOTE — Assessment & Plan Note (Signed)
Summary: back went out again-lb   Vital Signs:  Patient profile:   55 year old male Height:      74 inches Weight:      300.25 pounds BMI:     38.69 O2 Sat:      95 % on Room air Temp:     97.9 degrees F oral Pulse rate:   71 / minute BP sitting:   110 / 70  (left arm) Cuff size:   large  Vitals Entered By: Zella Ball Ewing CMA Duncan Dull) (January 16, 2010 8:31 AM)  O2 Flow:  Room air CC: Back Pain/RE   Primary Care Provider:  Dr. Oliver Barre  CC:  Back Pain/RE.  History of Present Illness: here with acute onset on chronic right lower back pain severity now mod to severe for 3 days, usual pain meds not helpful;  pain is right lower back more the lateral aspect assoc with pain and numbness to the right lateral hip area, worse to stand and doing dishes with the leg falls asleep (not weak);  has to sit down to make that better and the numbness and pain some better within a short time;  has seen surgeon (dr Isac Sarna) who recommended lumbar surgury but he is trying to avoid that;  this flare has now been worse x 3 days, started after walked a mile to bfast and back home last mon, worse in overall severity pain, unusual in that is it also more in the midline spineal area  with radaiton to right upper leg (not below the knee) with numbness, but no weakness but does have to hold on to support such as the computer chair at home b/c pain worse to stand up straight.  No gait change o/w, no injruy, fever, falls. No bowel or bladder change.  Currnet hydrocodn 5 325 left over from april not working - took 2 this am.   Pain and sittnfes worse to stand up. or bending or twist at the wiast.  Pt denies CP, worsening sob, doe, wheezing, orthopnea, pnd, worsening LE edema, palps, dizziness or syncope  Pt denies other new neuro symptoms such as headache, facial or extremity weakness .  Pt denies polydipsia, polyuria Overall good compliance with meds, trying to follow low chol  diet, wt stable, little excercise however     Preventive Screening-Counseling & Management      Drug Use:  no.    Problems Prior to Update: 1)  Back Pain  (ICD-724.5) 2)  Abscess, Gluteal  (ICD-682.5) 3)  Obstructive Sleep Apnea  (ICD-327.23) 4)  Hypersomnia  (ICD-780.54) 5)  Knee Pain, Left  (ICD-719.46) 6)  Acute Gouty Arthropathy  (ICD-274.01) 7)  Syncope, Vasovagal  (ICD-780.2) 8)  Preventive Health Care  (ICD-V70.0) 9)  Fever Unspecified  (ICD-780.60) 10)  Special Screening Malignant Neoplasm of Prostate  (ICD-V76.44) 11)  Impaired Fasting Glucose  (ICD-790.21) 12)  Preventive Health Care  (ICD-V70.0) 13)  Erectile Dysfunction  (ICD-302.72) 14)  Low Back Pain  (ICD-724.2) 15)  Glucose Intolerance  (ICD-271.3) 16)  Vertebral Fracture  (ICD-805.8) 17)  Hemorrhoids  (ICD-455.6) 18)  Obesity  (ICD-278.00) 19)  Hypertension  (ICD-401.9) 20)  Hyperlipidemia  (ICD-272.4) 21)  Gerd  (ICD-530.81)  Medications Prior to Update: 1)  Cialis 20 Mg Tabs (Tadalafil) .... Take 1 Tablet By Mouth Once A Day 2)  Lisinopril-Hydrochlorothiazide 20-12.5 Mg Tabs (Lisinopril-Hydrochlorothiazide) .... Take 2 Tablet By Mouth Once A Day 3)  Prilosec 40 Mg Cpdr (Omeprazole) .... Take 1 Capsule By Mouth  Once A Day 4)  Ecotrin Low Strength 81 Mg  Tbec (Aspirin) .[name] Kitchen.. 1 By Mouth Qd 5)  Fenofibrate 160 Mg Tabs (Fenofibrate) .[name] Kitchen.. 1po Once Daily 6)  Hydrocodone-Acetaminophen 5-325 Mg Tabs (Hydrocodone-Acetaminophen) .[name] Kitchen.. 1-2 By Mouth Two Times A Day As Needed Pain  Current Medications (verified): 1)  Cialis 20 Mg Tabs (Tadalafil) .... Take 1 Tablet By Mouth Once A Day 2)  Lisinopril-Hydrochlorothiazide 20-12.5 Mg Tabs (Lisinopril-Hydrochlorothiazide) .... Take 2 Tablet By Mouth Once A Day 3)  Prilosec 40 Mg Cpdr (Omeprazole) .... Take 1 Capsule By Mouth Once A Day 4)  Ecotrin Low Strength 81 Mg  Tbec (Aspirin) .[name] Kitchen.. 1 By Mouth Qd 5)  Fenofibrate 160 Mg Tabs (Fenofibrate) .[name] Kitchen.. 1po Once Daily 6)  Oxycodone Hcl 5 Mg Tabs (Oxycodone Hcl) .[name] Kitchen.. 1-2 By  Mouth Q 6 Hrs As Needed Pain 7)  Flexeril 5 Mg Tabs (Cyclobenzaprine Hcl) .[name] Kitchen.. 1po Three Times A Day As Needed 8)  Prednisone 10 Mg Tabs (Prednisone) .... 3po Qd For 3days, Then 2po Qd For 3days, Then 1po Qd For 3days, Then Stop  Allergies (verified): No Known Drug Allergies  Past History:  Past Medical History: Last updated: 06/14/2009 GERD Hyperlipidemia Hypertension Obesity Hemorrhoids H/O Vertebral Fx glucose intolerance Low Back pain/Lumbar radiculopathy E.D. chronic anal fissure  - Dr Orland Dec Recurrent buttock abscesses  Past Surgical History: Last updated: 09/29/2006 Hemorrhoidectomy  Social History: Last updated: 01/16/2010 Former Smoker. Quit 1999.  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 Quit 1999.  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  Non-toxic, NAD Head:  normocephalic and atraumatic.   Eyes:  vision grossly intact, pupils equal, and pupils round.   Ears:  R ear normal and L ear normal.   Nose:  no external deformity and no nasal discharge.   Mouth:  no gingival abnormalities and pharynx pink and moist.   Neck:  supple and no masses.   Lungs:  normal respiratory effort and normal breath sounds.   Heart:  normal rate and regular rhythm.   Abdomen:  Obese, S/NT Msk:  no acute general joint tenderness and no joint swelling;  spine nontender and no significant paravertebral tenderness , swelling or rash Extremities:  no edema, no erythema  Neurologic:  strength normal in all extremities, gait normal, and DTRs symmetrical and normal.     Impression &  Recommendations:  Problem # 1:  BACK PAIN (ICD-724.5)  His updated medication list for this problem includes:    Ecotrin Low Strength 81 Mg Tbec (Aspirin) .[name] Kitchen... 1 by mouth qd    Oxycodone Hcl 5 Mg Tabs (Oxycodone hcl) .[name] Kitchen... 1-2 by mouth q 6 hrs as needed pain    Flexeril 5 Mg Tabs (Cyclobenzaprine hcl) .[name] Kitchen... 1po three times a day as needed c/w spinal disc disease flare most likely; for pain med, flexeril as needed, and predpack trial  Problem # 2:  HYPERTENSION (ICD-401.9)  His updated medication list for this problem includes:    Lisinopril-hydrochlorothiazide 20-12.5 Mg Tabs (Lisinopril-hydrochlorothiazide) .[name] Kitchen... Take 2 tablet by mouth once a day  BP  today: 110/70 Prior BP: 122/68 (09/05/2009)  Labs Reviewed: K+: 4.0 (04/20/2009) Creat: : 1.1 (04/20/2009)   Chol: 186 (04/20/2009)   HDL: 44.20 (04/20/2009)   LDL: 110 (04/20/2009)   TG: 158.0 (04/20/2009) stable overall by hx and exam, ok to continue meds/tx as is   Problem # 3:  GLUCOSE INTOLERANCE (ICD-271.3) asympt , declines labs today,  pt to call for onset polys or cbg > 200 with steroid tx above  Complete Medication List: 1)  Cialis 20 Mg Tabs (Tadalafil) .... Take 1 tablet by mouth once a day 2)  Lisinopril-hydrochlorothiazide 20-12.5 Mg Tabs (Lisinopril-hydrochlorothiazide) .... Take 2 tablet by mouth once a day 3)  Prilosec 40 Mg Cpdr (Omeprazole) .... Take 1 capsule by mouth once a day 4)  Ecotrin Low Strength 81 Mg Tbec (Aspirin) .[name] Kitchen.. 1 by mouth qd 5)  Fenofibrate 160 Mg Tabs (Fenofibrate) .[name] Kitchen.. 1po once daily 6)  Oxycodone Hcl 5 Mg Tabs (Oxycodone hcl) .[name] Kitchen.. 1-2 by mouth q 6 hrs as needed pain 7)  Flexeril 5 Mg Tabs (Cyclobenzaprine hcl) .[name] Kitchen.. 1po three times a day as needed 8)  Prednisone 10 Mg Tabs (Prednisone) .... 3po qd for 3days, then 2po qd for 3days, then 1po qd for 3days, then stop  Patient Instructions: 1)  Please take all new medications as prescribed 2)  Continue all previous medications as before this  visit 3)  Please schedule a follow-up appointment in April 2012 for CPX with labs Prescriptions: PREDNISONE 10 MG TABS (PREDNISONE) 3po qd for 3days, then 2po qd for 3days, then 1po qd for 3days, then stop  #18 x 0   Entered and Authorized by:   Corwin Levins MD   Signed by:   Corwin Levins MD on 01/16/2010   Method used:   Print then Give to Patient   RxID:   [id number] FLEXERIL 5 MG TABS (CYCLOBENZAPRINE HCL) 1po three times a day as needed  #90 x 1   Entered and Authorized by:   Corwin Levins MD   Signed by:   Corwin Levins MD on 01/16/2010   Method used:   Print then Give to Patient   RxID:   [id number] OXYCODONE HCL 5 MG TABS (OXYCODONE HCL) 1-2 by mouth q 6 hrs as needed pain  #60 x 0   Entered and Authorized by:   Corwin Levins MD   Signed by:   Corwin Levins MD on 01/16/2010   Method used:   Print then Give to Patient   RxID:   [id number]    Orders Added: 1)  Est. Patient Level IV [01027]

## 2010-04-25 ENCOUNTER — Other Ambulatory Visit: Payer: Self-pay

## 2010-04-25 ENCOUNTER — Other Ambulatory Visit: Payer: Self-pay | Admitting: Internal Medicine

## 2010-04-25 DIAGNOSIS — Z0000 Encounter for general adult medical examination without abnormal findings: Secondary | ICD-10-CM

## 2010-04-25 DIAGNOSIS — Z1289 Encounter for screening for malignant neoplasm of other sites: Secondary | ICD-10-CM

## 2010-04-27 DIAGNOSIS — G4733 Obstructive sleep apnea (adult) (pediatric): Secondary | ICD-10-CM

## 2010-04-29 ENCOUNTER — Other Ambulatory Visit (INDEPENDENT_AMBULATORY_CARE_PROVIDER_SITE_OTHER): Payer: 59

## 2010-04-29 DIAGNOSIS — Z0000 Encounter for general adult medical examination without abnormal findings: Secondary | ICD-10-CM

## 2010-04-29 DIAGNOSIS — Z862 Personal history of diseases of the blood and blood-forming organs and certain disorders involving the immune mechanism: Secondary | ICD-10-CM

## 2010-04-29 DIAGNOSIS — Z8639 Personal history of other endocrine, nutritional and metabolic disease: Secondary | ICD-10-CM

## 2010-04-29 DIAGNOSIS — D649 Anemia, unspecified: Secondary | ICD-10-CM

## 2010-04-29 DIAGNOSIS — Z1289 Encounter for screening for malignant neoplasm of other sites: Secondary | ICD-10-CM

## 2010-04-29 LAB — HEPATIC FUNCTION PANEL
ALT: 31 U/L (ref 0–53)
Alkaline Phosphatase: 60 U/L (ref 39–117)
Bilirubin, Direct: 0.2 mg/dL (ref 0.0–0.3)
Total Bilirubin: 0.8 mg/dL (ref 0.3–1.2)
Total Protein: 6.6 g/dL (ref 6.0–8.3)

## 2010-04-29 LAB — BASIC METABOLIC PANEL
CO2: 26 mEq/L (ref 19–32)
Calcium: 9.4 mg/dL (ref 8.4–10.5)
GFR: 95.04 mL/min (ref >60.00)
Sodium: 133 mEq/L — ABNORMAL LOW (ref 135–145)

## 2010-04-29 LAB — CBC WITH DIFFERENTIAL/PLATELET
Basophils Relative: 0.7 % (ref 0.0–3.0)
Eosinophils Absolute: 0.1 10*3/uL (ref 0.0–0.7)
HCT: 34.4 % — ABNORMAL LOW (ref 39.0–52.0)
Lymphs Abs: 1.7 10*3/uL (ref 0.7–4.0)
MCHC: 34.3 g/dL (ref 30.0–36.0)
MCV: 101.2 fl — ABNORMAL HIGH (ref 78.0–100.0)
Monocytes Absolute: 0.5 10*3/uL (ref 0.1–1.0)
Neutrophils Relative %: 68.1 % (ref 43.0–77.0)
Platelets: 228 10*3/uL (ref 150.0–400.0)
RBC: 3.4 Mil/uL — ABNORMAL LOW (ref 4.22–5.81)

## 2010-04-29 LAB — B12 AND FOLATE PANEL
Folate: 7.9 ng/mL (ref >5.9)
Vitamin B-12: 297 pg/mL (ref 211–911)

## 2010-04-29 LAB — LIPID PANEL
HDL: 45.1 mg/dL (ref >39.00)
Total CHOL/HDL Ratio: 4
Triglycerides: 187 mg/dL — ABNORMAL HIGH (ref 0.0–149.0)

## 2010-04-29 LAB — URINALYSIS
Ketones, ur: NEGATIVE
Specific Gravity, Urine: 1.01 (ref 1.000–1.030)
Urine Glucose: NEGATIVE
pH: 7 (ref 5.0–8.0)

## 2010-04-29 LAB — IBC PANEL: Iron: 161 ug/dL (ref 42–165)

## 2010-05-02 ENCOUNTER — Ambulatory Visit (INDEPENDENT_AMBULATORY_CARE_PROVIDER_SITE_OTHER): Payer: 59 | Admitting: Internal Medicine

## 2010-05-02 ENCOUNTER — Encounter: Payer: Self-pay | Admitting: Internal Medicine

## 2010-05-02 VITALS — BP 128/78 | HR 66 | Temp 98.0°F | Ht 74.0 in | Wt 309.4 lb

## 2010-05-02 DIAGNOSIS — R7302 Impaired glucose tolerance (oral): Secondary | ICD-10-CM | POA: Insufficient documentation

## 2010-05-02 DIAGNOSIS — R7309 Other abnormal glucose: Secondary | ICD-10-CM

## 2010-05-02 DIAGNOSIS — Z0000 Encounter for general adult medical examination without abnormal findings: Secondary | ICD-10-CM

## 2010-05-02 DIAGNOSIS — K921 Melena: Secondary | ICD-10-CM

## 2010-05-02 MED ORDER — TADALAFIL 20 MG PO TABS: 20.0000 mg | ORAL_TABLET | Freq: Every day | ORAL | Status: DC

## 2010-05-02 MED ORDER — HYDROCORTISONE ACETATE 25 MG RE SUPP: 25.0000 mg | Freq: Two times a day (BID) | RECTAL | Status: DC

## 2010-05-02 NOTE — Patient Instructions (Signed)
Take all new medications as prescribed Continue all other medications as before, except hold the aspirin for now You will be contacted regarding the referral for: GI

## 2010-05-02 NOTE — Assessment & Plan Note (Signed)
Small volume chronic, likely related to known fissure, hemorrhoids for which he deferred tx per surgury , now with mild anemia and ongoing rectal discomfort;  Will refer GI per pt request, may need f/u colonoscopy sooner than 2015, to hold ASA for now, and tx with anusol hc asd

## 2010-05-02 NOTE — Assessment & Plan Note (Signed)
stable overall by hx and exam, most recent lab reviewed with pt, and pt to continue medical treatment as before 

## 2010-05-02 NOTE — Progress Notes (Signed)
Subjective:    Patient ID: Tony Savage, male    DOB: 1955-09-29, 55 y.o.   MRN: [id number]  HPI  Here for wellness and f/u;  Overall doing ok;  Pt denies CP, worsening SOB, DOE, wheezing, orthopnea, PND, worsening LE edema, palpitations, dizziness or syncope.  Pt denies neurological change such as new Headache, facial or extremity weakness.  Pt denies polydipsia, polyuria, or low sugar symptoms. Pt states overall good compliance with treatment and medications, good tolerability, and trying to follow lower cholesterol diet.  Pt denies worsening depressive symptoms, suicidal ideation or panic. No fever, wt loss, night sweats, loss of appetite, or other constitutional symptoms.  Pt states good ability with ADL's, low fall risk, home safety reviewed and adequate, no significant changes in hearing or vision, and occasionally active with exercise. Does have daily BRBPR small volume for 3 yrs, now with evidence for new mild anemia.  Was "scared" after d/w surgeon regarding proposed surgury (sounds like fissurectomy per Dr Gerrit Friends) so put it off.  No other overt bleeding or bruising, but still has some rectal discomfort as well.  Pt requests to see GI , and tx today.  Overall bleeding less now taking the ASA qod. States legs get tired going up stairs , wt conts to increased  - from 306 to 309.  Still wearing the CPAP at night and doing ok. Past Medical History  Diagnosis Date  . GLUCOSE INTOLERANCE 12/31/2006  . HYPERLIPIDEMIA 09/29/2006  . 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 OBESITY 09/29/2006  . ERECTILE DYSFUNCTION 12/31/2006  . HYPERTENSION 09/29/2006  . HEMORRHOIDS 09/29/2006  . GERD 09/29/2006  . ABSCESS, GLUTEAL 06/14/2009  . KNEE PAIN, LEFT 10/18/2008  . LOW BACK PAIN 12/31/2006  . SYNCOPE, VASOVAGAL 03/14/2008  . HYPERSOMNIA 04/30/2009  . FEVER UNSPECIFIED 06/08/2007  . Impaired fasting glucose 01/01/2007  . VERTEBRAL FRACTURE 09/29/2006  . BACK PAIN 01/16/2010   Past Surgical History  Procedure Date  .  Hemorrhoid surgery     reports that he quit smoking about 13 years ago. He does not have any smokeless tobacco history on file. He reports that he drinks alcohol. He reports that he does not use illicit drugs. family history includes Allergies in his sister; Cancer in his father; and Heart disease in his father. No Known Allergies Current Outpatient Prescriptions on File Prior to Visit  Medication Sig Dispense Refill  . cyclobenzaprine (FLEXERIL) 5 MG tablet Take 5 mg by mouth 3 (three) times daily as needed.        . fenofibrate 160 MG tablet Take 160 mg by mouth daily.        [name] Kitchen lisinopril-hydrochlorothiazide (PRINZIDE,ZESTORETIC) 20-12.5 MG per tablet Take 1 tablet by mouth 2 (two) times daily.        [name] Kitchen omeprazole (PRILOSEC) 10 MG capsule Take 10 mg by mouth daily.        [name] Kitchen oxycodone (OXY-IR) 5 MG capsule Take 5 mg by mouth. 1 - 2 by mouth every 6 hours as needed for pain       . tadalafil (CIALIS) 20 MG tablet Take 20 mg by mouth daily.        [name] Kitchen aspirin 81 MG EC tablet Take 81 mg by mouth daily.        . predniSONE (DELTASONE) 10 MG tablet Take 10 mg by mouth. 3 po every day for 3 days, then 2 po every day for 3 days,then 1 po every day for 3 days then stop  Review of Systems Review of Systems  Constitutional: Negative for diaphoresis, activity change, appetite change and unexpected weight change.  HENT: Negative for hearing loss, ear pain, facial swelling, mouth sores and neck stiffness.   Eyes: Negative for pain, redness and visual disturbance.  Respiratory: Negative for shortness of breath and wheezing.   Cardiovascular: Negative for chest pain and palpitations.  Gastrointestinal: Negative for diarrhea, blood in stool, abdominal distention and rectal pain.  Genitourinary: Negative for hematuria, flank pain and decreased urine volume.  Musculoskeletal: Negative for myalgias and joint swelling.  Skin: Negative for color change and wound.  Neurological: Negative for syncope and  numbness except for longer walking causes recurrent LE numbness , better with rest, with known LS spine dz and pt declines surgury about 5 yrs ago.  Hematological: Negative for adenopathy.  Psychiatric/Behavioral: Negative for hallucinations, self-injury, decreased concentration and agitation.      Objective:   Physical Exam Physical Exam  VS noted Constitutional: Pt is oriented to person, place, and time. Appears well-developed and well-nourished.  HENT:  Head: Normocephalic and atraumatic.  Right Ear: External ear normal.  Left Ear: External ear normal.  Nose: Nose normal.  Mouth/Throat: Oropharynx is clear and moist.  Eyes: Conjunctivae and EOM are normal. Pupils are equal, round, and reactive to light.  Neck: Normal range of motion. Neck supple. No JVD present. No tracheal deviation present.  Cardiovascular: Normal rate, regular rhythm, normal heart sounds and intact distal pulses.   Pulmonary/Chest: Effort normal and breath sounds normal.  Abdominal: Soft. Bowel sounds are normal. There is no tenderness.  Musculoskeletal: Normal range of motion. Exhibits no edema.  Lymphadenopathy:  Has no cervical adenopathy.  Neurological: Pt is alert and oriented to person, place, and time. Pt has normal reflexes. No cranial nerve deficit.  Skin: Skin is warm and dry. No rash noted.  Psychiatric:  Has  normal mood and affect. Behavior is normal.         Assessment & Plan:

## 2010-05-02 NOTE — Assessment & Plan Note (Signed)

## 2010-05-05 ENCOUNTER — Other Ambulatory Visit: Payer: Self-pay | Admitting: Internal Medicine

## 2010-05-06 ENCOUNTER — Other Ambulatory Visit: Payer: Self-pay | Admitting: Internal Medicine

## 2010-05-14 ENCOUNTER — Other Ambulatory Visit: Payer: Self-pay | Admitting: Internal Medicine

## 2010-06-04 ENCOUNTER — Encounter: Payer: Self-pay | Admitting: Gastroenterology

## 2010-06-05 ENCOUNTER — Ambulatory Visit: Payer: 59 | Admitting: Gastroenterology

## 2010-06-14 ENCOUNTER — Ambulatory Visit (INDEPENDENT_AMBULATORY_CARE_PROVIDER_SITE_OTHER): Payer: 59 | Admitting: Internal Medicine

## 2010-06-14 ENCOUNTER — Encounter: Payer: Self-pay | Admitting: Internal Medicine

## 2010-06-14 VITALS — BP 118/68 | HR 74 | Temp 98.8°F | Ht 73.0 in | Wt 308.0 lb

## 2010-06-14 DIAGNOSIS — R7309 Other abnormal glucose: Secondary | ICD-10-CM

## 2010-06-14 DIAGNOSIS — R609 Edema, unspecified: Secondary | ICD-10-CM

## 2010-06-14 DIAGNOSIS — R7302 Impaired glucose tolerance (oral): Secondary | ICD-10-CM

## 2010-06-14 DIAGNOSIS — I10 Essential (primary) hypertension: Secondary | ICD-10-CM

## 2010-06-14 NOTE — Discharge Summary (Signed)
[place]. [facility]  Patient:    Tony Savage, Tony Savage                      MRN: [id number] Adm. Date:  03/09/00 Disc. Date: 03/10/00 Attending:  Cecil Cranker, M.D. [facility] Dictator:   Tereso Newcomer, P.A. CC:         [facility]  Charlynne Pander. Bruna Potter, M.D.   Discharge Summary  DATE OF BIRTH:  [birth year]  DISCHARGE DIAGNOSES: 1. Noncardiac chest pain. 2. Hypertension. 3. Dyslipidemia. 4. Ex-smoker. 5. History of gastroesophageal reflux disease.  PROCEDURE:  Gaited exercise treadmill Cardiolite on March 10, 2000, revealing normal wall motion, no ischemia, EF 59%, positive diaphragmatic attenuation.  HOSPITAL COURSE:  This 55 year old male with a longstanding history of hypertension was admitted through the ER for intermittent substernal chest pain described as tightness since the weekend.  He noted some associated left arm numbness and some nausea.  He had no diaphoresis or shortness of breath. His EKG in the emergency room showed sinus bradycardia.  He went to an Urgent Care and Dr. Juanda Chance was called.  The patient was told to come to the [facility] ER.  He has no known drug allergies.  Initial blood pressure was 170/90, pulse 52 and regular, respirations 16.  Neck was without bruits or JVD.  His chest was clear to auscultation and percussion.  Cardiac examination revealed a regular rate and rhythm without murmurs, rubs, or gallops, normal S1 and S2.  His abdomen was nontender without hepatosplenomegaly and his extremities were without edema.  He was admitted and ruled out for myocardial infarction by enzymes.  He had no further chest pain.  He went for the treadmill Cardiolite as noted above on March 10, 2000.  He exercised for a total of 9 minutes and completed stage III of Bruce protocol.  He achieved greater than 90% of his predicted maximal heart rate with a heart rate of 174.  He had an appropriate hypertensive  response.  He denied any chest pain throughout the procedure.  He did shortness of breath and fatigue.  His EKG was without acute changes and his Cardiolite images are as noted above.  Given the results of his Cardiolite, it was felt he was stable enough for discharge to home.  He requests follow-up with our primary care doctors.  Our office should call him with an appointment.  His LFTs were noted to be elevated.  On admission his AST was 44, ALT 57, and total bilirubin 2.2.  His alkaline phosphatase was normal at 96, albumin normal at 4.5, and total protein normal at 7.4.  Other labs; sodium 134, potassium 3.7, chloride 104, CO2 23, glucose 109, BUN 14, creatinine 1.0, calcium 9.7, INR 0.9, and white blood cell count 8900. Hemoglobin 15.5, hematocrit 45.1, MCV 95.5, and platelet count 250,000. Urinalysis normal.  He will follow up with our primary care for his elevated LFTs.  DISCHARGE MEDICATIONS: 1. Tenormin 50 mg q.d. 2. Aspirin 81 mg q.d.  ACTIVITY:  As tolerated.  DIET:  Low fat, low cholesterol diet.  Limit fried foods and eat more baked foods.  Our office will call him with a follow-up appointment and he has been provided with our phone number in case he does not hear from our primary care office.  As noted above, he is to follow up with primary care for elevated LFTs.  He should probably have recheck on his lipid profile in about  six months. DD:  03/10/00 TD:  03/10/00 Job: [id number]

## 2010-06-14 NOTE — Assessment & Plan Note (Signed)
Likely venous insuff vs cardiac  Vs other - for echo, Continue all other medications as before

## 2010-06-14 NOTE — Patient Instructions (Signed)
Continue all other medications as before You will be contacted regarding the referral for: echocardiogram

## 2010-06-14 NOTE — H&P (Signed)
[place]  Patient:    Tony Savage, Tony Savage                      MRN: [id number] Adm. Date:  03/09/00 Dictator:   Abelino Derrick, P.A.C. [facility]                         History and Physical  CHIEF COMPLAINT:  Chest pain.  HISTORY OF PRESENT ILLNESS:  The patient is a 55 year old male with a history of longstanding hypertension and past history of hyperlipidemia.  He is admitted now with intermittent substernal chest pain described as tightness since Friday.  He admits to some associated left arm numbness and this morning he had some associated nausea.  He has had no diaphoresis or shortness of breath.  His EKG in the emergency room showed sinus rhythm and sinus bradycardia.  [facility] and [facility] called Dr. Juanda Chance. The patient was sent to [facility] ER.  He is currently pain-free.  He did receive nitroglycerin at Urgent Care.  PAST MEDICAL HISTORY:  Remarkable for hypertension, he has been on medicine since age 64 years old.  He has been told his cholesterol has been high in the past and he treated this with diet.  He has had a history of a cyst in the past and a fistula repair.  MEDICATIONS:  Tenormin 50 mg a day.  ALLERGIES:  No known drug allergies.  SOCIAL HISTORY:  He works in a [employer].  He has been married for 21 years. He is an ex-smoker.  He quit three years ago.  FAMILY HISTORY:  His father died of heart failure, but he was 53 years old. There is no family history of early coronary artery disease.  REVIEW OF SYSTEMS:  Essentially unremarkable except as noted above.  There is no history of GI bleeding or peptic ulcer disease, kidney stones.  He has had endoscopy and colonoscopy in the past in our office and has been told he has had reflux.  Apparently he could not tolerate medicines for this, because it caused abdominal pain.  PHYSICAL EXAMINATION:  VITAL SIGNS:  Blood pressure 170/90, pulse 52  and regular, respirations 16.  GENERAL:  He is an overweight well-nourished well-developed male in no acute distress.  NECK:  Without bruit or JVD.  CHEST:  Clear to auscultation and percussion.  HEART:  Regular rate and rhythm without murmurs, rubs, or gallops.  Normal S1 and S2.  ABDOMEN:  Nontender with no hepatosplenomegaly noted.  EXTREMITIES:  Without edema.  There are no femoral bruits.  Distal pulses are 2+/4.  NEUROLOGICAL:  Essentially intact.  He is awake, alert, oriented and cooperative.  EKG shows sinus rhythm with nonspecific ST changes, sinus bradycardia.  Lab and chest x-ray are pending.  IMPRESSION: 1. Unstable angina. 2. Hypertension. 3. Hyperlipidemia, treated in the past with diet only. 4. History of ex-smoker.  PAST MEDICAL HISTORY:  Reflux.  PLAN:  The patient will be admitted to telemetry.  CK-MBs and troponins will be obtained.  Will add coated aspirin and Prevacid to his atenolol.  Further evaluation will be discussed with M.D., ie, catheterization and further stress test. DD:  03/09/00 TD:  03/09/00 Job: [id number]

## 2010-06-16 ENCOUNTER — Encounter: Payer: Self-pay | Admitting: Internal Medicine

## 2010-06-16 NOTE — Progress Notes (Signed)
Subjective:    Patient ID: Tony Savage, male    DOB: 07/03/1955, 55 y.o.   MRN: [id number]  HPI  Here to f/u with trace edema onset since last visit bilat pedal;  Pt denies chest pain, increased sob or doe, wheezing, orthopnea, PND, palpitations, dizziness or syncope. Pt denies new neurological symptoms such as new headache, or facial or extremity weakness or numbness   Pt denies polydipsia, polyuria Pt states overall good compliance with meds, trying to follow lower cholesterol, diet, wt overall stable but little exercise however.   Has some varicosities as well to both legs,  May have had more salt in diet lately, has not been elevating legs. Past Medical History  Diagnosis Date  . GLUCOSE INTOLERANCE 12/31/2006  . HYPERLIPIDEMIA 09/29/2006  . 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 OBESITY 09/29/2006  . ERECTILE DYSFUNCTION 12/31/2006  . HYPERTENSION 09/29/2006  . HEMORRHOIDS 09/29/2006  . GERD 09/29/2006  . ABSCESS, GLUTEAL 06/14/2009  . KNEE PAIN, LEFT 10/18/2008  . LOW BACK PAIN 12/31/2006  . SYNCOPE, VASOVAGAL 03/14/2008  . HYPERSOMNIA 04/30/2009  . FEVER UNSPECIFIED 06/08/2007  . Impaired fasting glucose 01/01/2007  . VERTEBRAL FRACTURE 09/29/2006  . BACK PAIN 01/16/2010  . Diverticulosis    Past Surgical History  Procedure Date  . Hemorrhoid surgery     reports that he quit smoking about 13 years ago. He does not have any smokeless tobacco history on file. He reports that he drinks alcohol. He reports that he does not use illicit drugs. family history includes Allergies in his sister; Cancer in his father; and Heart disease in his father. No Known Allergies Current Outpatient Prescriptions on File Prior to Visit  Medication Sig Dispense Refill  . CIALIS 20 MG tablet TAKE 1 TABLET BY MOUTH  3 tablet  0  . cyclobenzaprine (FLEXERIL) 5 MG tablet TAKE 1 TABLET BY MOUTH 3 TIMES A DAY AS NEEDED  90 tablet  1  . fenofibrate 160 MG tablet TAKE 1 TABLET BY MOUTH EVERY DAY  30 tablet  8  .  hydrocortisone (ANUSOL-HC) 25 MG suppository Place 1 suppository (25 mg total) rectally every 12 (twelve) hours.  12 suppository  1  . lisinopril-hydrochlorothiazide (PRINZIDE,ZESTORETIC) 20-12.5 MG per tablet TAKE 2 TABLET BY MOUTH ONCE A DAY  180 tablet  3  . omeprazole (PRILOSEC) 10 MG capsule Take 10 mg by mouth daily.        [name] Kitchen aspirin 81 MG EC tablet Take 81 mg by mouth daily.        [name] Kitchen oxycodone (OXY-IR) 5 MG capsule Take 5 mg by mouth. 1 - 2 by mouth every 6 hours as needed for pain       . predniSONE (DELTASONE) 10 MG tablet Take 10 mg by mouth. 3 po every day for 3 days, then 2 po every day for 3 days,then 1 po every day for 3 days then stop        Review of Systems All otherwise neg per pt     Objective:   Physical Exam BP 118/68  Pulse 74  Temp(Src) 98.8 F (37.1 C) (Oral)  Ht 6\' 1"  (1.854 m)  Wt 308 lb (139.708 kg)  BMI 40.64 kg/m2  SpO2 96% Physical Exam  VS noted Constitutional: Pt appears well-developed and well-nourished.  HENT: Head: Normocephalic.  Right Ear: External ear normal.  Left Ear: External ear normal.  Eyes: Conjunctivae and EOM are normal. Pupils are equal, round, and reactive to light.  Neck: Normal range  of motion. Neck supple.  Cardiovascular: Normal rate and regular rhythm.   Pulmonary/Chest: Effort normal and breath sounds normal.  Abd:  Soft, NT, non-distended, + BS Neurological: Pt is alert. No cranial nerve deficit.  Skin: Skin is warm. No erythema. trace ankle edema bilat Psychiatric: Pt behavior is normal. Thought content normal.         Assessment & Plan:

## 2010-06-16 NOTE — Assessment & Plan Note (Signed)
stable overall by hx and exam, most recent lab reviewed with pt, and pt to continue medical treatment as before  Lab Results  Component Value Date   WBC 7.5 04/29/2010   HGB 11.8* 04/29/2010   HCT 34.4* 04/29/2010   PLT 228.0 04/29/2010   CHOL 192 04/29/2010   TRIG 187.0* 04/29/2010   HDL 45.10 04/29/2010   LDLDIRECT 143.6 03/14/2008   ALT 31 04/29/2010   AST 32 04/29/2010   NA 133* 04/29/2010   K 4.2 04/29/2010   CL 99 04/29/2010   CREATININE 1.1 04/29/2010   BUN 17 04/29/2010   CO2 26 04/29/2010   TSH 2.02 04/29/2010   PSA 1.07 04/29/2010   HGBA1C 4.7 12/15/2005

## 2010-06-16 NOTE — Assessment & Plan Note (Addendum)
asympt - stable overall by hx and exam, most recent lab reviewed with pt, and pt to continue medical treatment as before  Lab Results  Component Value Date   HGBA1C 4.7 12/15/2005

## 2010-06-22 ENCOUNTER — Other Ambulatory Visit: Payer: Self-pay | Admitting: Internal Medicine

## 2010-06-25 ENCOUNTER — Other Ambulatory Visit (HOSPITAL_COMMUNITY): Payer: 59 | Admitting: Radiology

## 2010-06-25 ENCOUNTER — Other Ambulatory Visit: Payer: Self-pay | Admitting: Internal Medicine

## 2010-07-15 ENCOUNTER — Other Ambulatory Visit: Payer: Self-pay | Admitting: Internal Medicine

## 2010-07-22 ENCOUNTER — Encounter: Payer: Self-pay | Admitting: Gastroenterology

## 2010-07-22 ENCOUNTER — Ambulatory Visit (INDEPENDENT_AMBULATORY_CARE_PROVIDER_SITE_OTHER): Payer: 59 | Admitting: Gastroenterology

## 2010-07-22 VITALS — BP 118/86 | HR 68 | Ht 74.0 in | Wt 306.0 lb

## 2010-07-22 DIAGNOSIS — R933 Abnormal findings on diagnostic imaging of other parts of digestive tract: Secondary | ICD-10-CM

## 2010-07-22 DIAGNOSIS — K921 Melena: Secondary | ICD-10-CM

## 2010-07-22 DIAGNOSIS — D649 Anemia, unspecified: Secondary | ICD-10-CM

## 2010-07-22 MED ORDER — PEG-KCL-NACL-NASULF-NA ASC-C 100 G PO SOLR: 1.0000 | Freq: Once | ORAL | Status: DC

## 2010-07-22 NOTE — Progress Notes (Signed)
History of Present Illness: This is a 55 year old male that I have seen in the past. He relates problems with daily bright red rectal bleeding, generally associated with bowel movements and occasionally he passes small amounts of blood spontaneously. He typically has 3-4 bowel movements per day. He also notes burning anal pain with bowel movements on occasion. 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 He has been evaluated by Dr. Gerrit Friends. He underwent an attempted colonoscopy in July 2005 that was only completed to the sigmoid colon due to a tortuous and spastic sigmoid colon. Internal hemorrhoids were injected at that time. A subsequent air-contrast barium enema in 2005 showed a fixed area of narrowing involving the proximal sigmoid colon which was felt to be postinflammatory and not neoplastic. Diverticular disease was also noted throughout the colon. He notes his weight is stable his appetite is good. He notes no change in stool caliber or change in bowel habits.   Past Medical History  Diagnosis Date  . GLUCOSE INTOLERANCE 12/31/2006  . HYPERLIPIDEMIA 09/29/2006  . 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 OBESITY 09/29/2006  . ERECTILE DYSFUNCTION 12/31/2006  . HYPERTENSION 09/29/2006  . HEMORRHOIDS 09/29/2006  . GERD 09/29/2006  . ABSCESS, GLUTEAL 06/14/2009  . KNEE PAIN, LEFT 10/18/2008  . LOW BACK PAIN 12/31/2006  . SYNCOPE, VASOVAGAL 03/14/2008  . HYPERSOMNIA 04/30/2009  . FEVER UNSPECIFIED 06/08/2007  . Impaired fasting glucose 01/01/2007  . VERTEBRAL FRACTURE 09/29/2006  . BACK PAIN 01/16/2010  . Diverticulosis   . Anal fissure   . Sleep apnea    Past Surgical History  Procedure Date  . Hemorrhoid surgery   . Fistula-in-ano repair   . Drainage of recurrent perirectal abceses     reports that he quit smoking about 13 years ago. He has never  used smokeless tobacco. He reports that he drinks alcohol. He reports that he does not use illicit drugs. family history includes Allergies in his sister; Colon polyps in his brother; Diabetes in his mother; Heart disease in his father; and Prostate cancer in his father. No Known Allergies    Outpatient Encounter Prescriptions as of 07/22/2010  Medication Sig Dispense Refill  . aspirin 81 MG EC tablet Take 81 mg by mouth daily.        . fenofibrate 160 MG tablet TAKE 1 TABLET BY MOUTH EVERY DAY  30 tablet  8  . hydrocortisone (ANUSOL-HC) 25 MG suppository PLACE 1 SUPPOSITORY (25 MG TOTAL) RECTALLY EVERY 12 (TWELVE) HOURS.  12 suppository  1  . lisinopril-hydrochlorothiazide (PRINZIDE,ZESTORETIC) 20-12.5 MG per tablet TAKE 2 TABLET BY MOUTH ONCE A DAY  180 tablet  3  . omeprazole (PRILOSEC) 10 MG capsule Take 10 mg by mouth daily.        . cyclobenzaprine (FLEXERIL) 5 MG tablet TAKE 1 TABLET BY MOUTH 3 TIMES A DAY AS NEEDED  90 tablet  1  . peg 3350 powder (MOVIPREP) 100 G SOLR Take 1 kit (100 g total) by mouth once.  1 kit  0  . DISCONTD: CIALIS 20 MG tablet TAKE 1 TABLET BY MOUTH  5 tablet  3    Review of Systems: Pertinent positive and negative review of systems were noted in the above HPI section. All other review of systems were otherwise negative.   Physical Exam: General: Well developed , well nourished, no acute distress. Obese.  Head: Normocephalic and atraumatic Eyes:  sclerae anicteric, EOMI Ears: Normal auditory acuity Mouth: No deformity or lesions Neck: Supple, no masses or thyromegaly Lungs: Clear throughout to auscultation Heart: Regular rate and rhythm; no murmurs, rubs or bruits Abdomen: Soft, non tender and non distended. No masses, hepatosplenomegaly or hernias noted. Normal Bowel sounds Rectal: Mild anal canal deformity without lesions or tenderness. Brown Hemoccult negative stool the vault with no lesions.   Musculoskeletal: Symmetrical with no gross deformities    Skin: No lesions on visible extremities Pulses:  Normal pulses noted Extremities: No clubbing, cyanosis, edema or deformities noted Neurological: Alert oriented x 4, grossly nonfocal Cervical Nodes:  No significant cervical adenopathy Inguinal Nodes: No significant inguinal adenopathy Psychological:  Alert and cooperative. Normal mood and affect  Assessment and Recommendations:  1. Hematochezia, Hemoccult-positive stool and anal pain. History of internal hemorrhoids, fistula-in-ano repair and recurrent perianal and perirectal abscesses. Rule out hemorrhoids, anal fissure, inflammatory bowel disease and colorectal neoplasms.The risks, benefits, and alternatives to colonoscopy with possible biopsy, possible destruction of internal hemorrhoids and possible polypectomy were discussed with the patient and they consent to proceed. Given the difficulties encountered at the last colonoscopy with an incomplete examination plan to proceed with MAC sedation. The patient agrees.  2. Abnormal barium enema with fixed narrowing at the level of the sigmoid colon in 2005.  3. Anemia. Hemoglobin 11.8 with elevated MCV of 101. Iron studies B12 and folate unremarkable.

## 2010-07-22 NOTE — Patient Instructions (Signed)
You have been scheduled for a Colonoscopy. Separate instructions given. Pick up your prep from your pharmacy.  cc: Oliver Barre, MD

## 2010-08-30 ENCOUNTER — Other Ambulatory Visit: Payer: 59 | Admitting: Gastroenterology

## 2010-09-11 ENCOUNTER — Encounter: Payer: 59 | Admitting: Gastroenterology

## 2010-10-15 ENCOUNTER — Other Ambulatory Visit: Payer: Self-pay | Admitting: Internal Medicine

## 2010-12-27 ENCOUNTER — Ambulatory Visit (INDEPENDENT_AMBULATORY_CARE_PROVIDER_SITE_OTHER): Payer: 59 | Admitting: Internal Medicine

## 2010-12-27 ENCOUNTER — Encounter: Payer: Self-pay | Admitting: Internal Medicine

## 2010-12-27 VITALS — BP 118/80 | HR 69 | Temp 98.2°F | Ht 74.0 in | Wt 309.4 lb

## 2010-12-27 DIAGNOSIS — I10 Essential (primary) hypertension: Secondary | ICD-10-CM

## 2010-12-27 DIAGNOSIS — H6010 Cellulitis of external ear, unspecified ear: Secondary | ICD-10-CM | POA: Insufficient documentation

## 2010-12-27 DIAGNOSIS — H60399 Other infective otitis externa, unspecified ear: Secondary | ICD-10-CM

## 2010-12-27 DIAGNOSIS — R7309 Other abnormal glucose: Secondary | ICD-10-CM

## 2010-12-27 DIAGNOSIS — R7302 Impaired glucose tolerance (oral): Secondary | ICD-10-CM

## 2010-12-27 MED ORDER — DOXYCYCLINE HYCLATE 100 MG PO TABS: 100.0000 mg | ORAL_TABLET | Freq: Two times a day (BID) | ORAL | Status: AC

## 2010-12-27 NOTE — Patient Instructions (Addendum)
Take all new medications as prescribed Continue all other medications as before  

## 2010-12-28 ENCOUNTER — Encounter: Payer: Self-pay | Admitting: Internal Medicine

## 2010-12-28 NOTE — Assessment & Plan Note (Signed)
Mild to mod, for antibx course,  to f/u any worsening symptoms or concerns 

## 2010-12-28 NOTE — Assessment & Plan Note (Signed)
stable overall by hx and exam, most recent data reviewed with pt, and pt to continue medical treatment as before  BP Readings from Last 3 Encounters:  12/27/10 118/80  07/22/10 118/86  06/14/10 118/68

## 2010-12-28 NOTE — Assessment & Plan Note (Signed)
stable overall by hx and exam, most recent data reviewed with pt, and pt to continue medical treatment as before Lab Results  Component Value Date   HGBA1C 4.7 12/15/2005   Pt to call for onset polys or cbg > 200, for cont;d diet and and wt control

## 2010-12-28 NOTE — Progress Notes (Signed)
Subjective:    Patient ID: Tony Savage, male    DOB: [birth year], 55 y.o.   MRN: [id number]  HPI Here with acute onset 3 days right earlobe red, tender, swelling wtihout d/c, though has had hx of recurrent boils in the past.  No fever, HA, ST, cough and Pt denies chest pain, increased sob or doe, wheezing, orthopnea, PND, increased LE swelling, palpitations, dizziness or syncope.  Pt denies new neurological symptoms such as new headache, or facial or extremity weakness or numbness   Pt denies polydipsia, polyuria,  Pt states overall good compliance with meds, trying to follow lower cholesterol diet, wt overall stable but little exercise however.  Pt denies fever, wt loss, night sweats, loss of appetite, or other constitutional symptoms Past Medical History  Diagnosis Date  . GLUCOSE INTOLERANCE 12/31/2006  . HYPERLIPIDEMIA 09/29/2006  . 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 OBESITY 09/29/2006  . ERECTILE DYSFUNCTION 12/31/2006  . HYPERTENSION 09/29/2006  . HEMORRHOIDS 09/29/2006  . GERD 09/29/2006  . ABSCESS, GLUTEAL 06/14/2009  . KNEE PAIN, LEFT 10/18/2008  . LOW BACK PAIN 12/31/2006  . SYNCOPE, VASOVAGAL 03/14/2008  . HYPERSOMNIA 04/30/2009  . FEVER UNSPECIFIED 06/08/2007  . Impaired fasting glucose 01/01/2007  . VERTEBRAL FRACTURE 09/29/2006  . BACK PAIN 01/16/2010  . Diverticulosis   . Anal fissure   . Sleep apnea    Past Surgical History  Procedure Date  . Hemorrhoid surgery   . Fistula-in-ano repair   . Drainage of recurrent perirectal abceses     reports that he quit smoking about 13 years ago. He has never used smokeless tobacco. He reports that he drinks alcohol. He reports that he does not use illicit drugs. family history includes Allergies in his sister; Colon polyps in his brother; Diabetes in his mother; Heart disease in his father; and Prostate cancer in his father. No Known Allergies Current Outpatient Prescriptions on File Prior to Visit  Medication Sig Dispense Refill  .  aspirin 81 MG EC tablet Take 81 mg by mouth daily.        . cyclobenzaprine (FLEXERIL) 5 MG tablet TAKE 1 TABLET BY MOUTH 3 TIMES A DAY AS NEEDED  90 tablet  1  . fenofibrate 160 MG tablet TAKE 1 TABLET BY MOUTH EVERY DAY  30 tablet  8  . lisinopril-hydrochlorothiazide (PRINZIDE,ZESTORETIC) 20-12.5 MG per tablet TAKE 2 TABLET BY MOUTH ONCE A DAY  180 tablet  3  . hydrocortisone (ANUSOL-HC) 25 MG suppository PLACE 1 SUPPOSITORY (25 MG TOTAL) RECTALLY EVERY 12 (TWELVE) HOURS.  12 suppository  1  . omeprazole (PRILOSEC) 10 MG capsule Take 10 mg by mouth daily.        [name] Kitchen omeprazole (PRILOSEC) 40 MG capsule TAKE 1 CAPSULE BY MOUTH ONCE A DAY  60 capsule  5  . peg 3350 powder (MOVIPREP) 100 G SOLR Take 1 kit (100 g total) by mouth once.  1 kit  0   Review of Systems Review of Systems  Constitutional: Negative for diaphoresis and unexpected weight change.  HENT: Negative for drooling and tinnitus.   Eyes: Negative for photophobia and visual disturbance.  Respiratory: Negative for choking and stridor.   Gastrointestinal: Negative for vomiting and blood in stool.  Genitourinary: Negative for hematuria and decreased urine volume.    Objective:   Physical Exam BP 118/80  Pulse 69  Temp(Src) 98.2 F (36.8 C) (Oral)  Ht 6\' 2"  (1.88 m)  Wt 309 lb 6 oz (140.332 kg)  BMI 39.72 kg/m2  SpO2 97% Physical Exam  VS noted, mild ill Constitutional: Pt appears well-developed and well-nourished.  HENT: Head: Normocephalic.  Right Ear: External ear normal.  Left Ear: External ear normal.  Right earlobe 3+ red, tender, swelling without fluctuance or drainage  Eyes: Conjunctivae and EOM are normal. Pupils are equal, round, and reactive to light.  Neck: Normal range of motion. Neck supple.  Cardiovascular: Normal rate and regular rhythm.   Pulmonary/Chest: Effort normal and breath sounds normal.  Neurological: Pt is alert. No cranial nerve deficit.  Skin: Skin is warm. No erythema.  Psychiatric: Pt behavior  is normal. Thought content normal.     Assessment & Plan:

## 2010-12-30 ENCOUNTER — Ambulatory Visit: Payer: 59 | Admitting: Internal Medicine

## 2011-03-03 ENCOUNTER — Encounter: Payer: Self-pay | Admitting: Gastroenterology

## 2011-03-18 ENCOUNTER — Encounter: Payer: Self-pay | Admitting: Gastroenterology

## 2011-03-18 ENCOUNTER — Ambulatory Visit (AMBULATORY_SURGERY_CENTER): Payer: 59 | Admitting: *Deleted

## 2011-03-18 VITALS — Ht 74.0 in | Wt 312.6 lb

## 2011-03-18 DIAGNOSIS — K921 Melena: Secondary | ICD-10-CM

## 2011-03-18 MED ORDER — PEG-KCL-NACL-NASULF-NA ASC-C 100 G PO SOLR: ORAL | Status: DC

## 2011-04-01 ENCOUNTER — Encounter: Payer: Self-pay | Admitting: Gastroenterology

## 2011-04-01 ENCOUNTER — Ambulatory Visit (AMBULATORY_SURGERY_CENTER): Payer: 59 | Admitting: Gastroenterology

## 2011-04-01 VITALS — BP 140/72 | HR 62 | Temp 97.7°F | Resp 18 | Ht 74.0 in | Wt 312.0 lb

## 2011-04-01 DIAGNOSIS — D126 Benign neoplasm of colon, unspecified: Secondary | ICD-10-CM

## 2011-04-01 DIAGNOSIS — K921 Melena: Secondary | ICD-10-CM

## 2011-04-01 DIAGNOSIS — K649 Unspecified hemorrhoids: Secondary | ICD-10-CM

## 2011-04-01 MED ORDER — SODIUM CHLORIDE 0.9 % IV SOLN: 500.0000 mL | INTRAVENOUS | Status: DC

## 2011-04-01 NOTE — Progress Notes (Signed)
Patient did not have preoperative order for IV antibiotic SSI prophylaxis. (G8918)  Patient did not experience any of the following events: a burn prior to discharge; a fall within the facility; wrong site/side/patient/procedure/implant event; or a hospital transfer or hospital admission upon discharge from the facility. (G8907)  

## 2011-04-01 NOTE — Patient Instructions (Addendum)
YOU HAD AN ENDOSCOPIC PROCEDURE TODAY AT THE [facility]: Refer to the procedure report that was given to you for any specific questions about what was found during the examination.  If the procedure report does not answer your questions, please call your gastroenterologist to clarify.  If you requested that your care partner not be given the details of your procedure findings, then the procedure report has been included in a sealed envelope for you to review at your convenience later.  YOU SHOULD EXPECT: Some feelings of bloating in the abdomen. Passage of more gas than usual.  Walking can help get rid of the air that was put into your GI tract during the procedure and reduce the bloating. If you had a lower endoscopy (such as a colonoscopy or flexible sigmoidoscopy) you may notice spotting of blood in your stool or on the toilet paper. If you underwent a bowel prep for your procedure, then you may not have a normal bowel movement for a few days.  DIET: Your first meal following the procedure should be a light meal and then it is ok to progress to your normal diet.  A half-sandwich or bowl of soup is an example of a good first meal.  Heavy or fried foods are harder to digest and may make you feel nauseous or bloated.  Likewise meals heavy in dairy and vegetables can cause extra gas to form and this can also increase the bloating.  Drink plenty of fluids but you should avoid alcoholic beverages for 24 hours.  ACTIVITY: Your care partner should take you home directly after the procedure.  You should plan to take it easy, moving slowly for the rest of the day.  You can resume normal activity the day after the procedure however you should NOT DRIVE or use heavy machinery for 24 hours (because of the sedation medicines used during the test).    SYMPTOMS TO REPORT IMMEDIATELY: A gastroenterologist can be reached at any hour.  During normal business hours, 8:30 AM to 5:00 PM Monday through Friday,  call [phone].  After hours and on weekends, please call the GI answering service at [phone] who will take a message and have the physician on call contact you.   Following lower endoscopy (colonoscopy or flexible sigmoidoscopy):  Excessive amounts of blood in the stool  Significant tenderness or worsening of abdominal pains  Swelling of the abdomen that is new, acute  Fever of 100F or higher  FOLLOW UP: If any biopsies were taken you will be contacted by phone or by letter within the next 1-3 weeks.  Call your gastroenterologist if you have not heard about the biopsies in 3 weeks.  Our staff will call the home number listed on your records the next business day following your procedure to check on you and address any questions or concerns that you may have at that time regarding the information given to you following your procedure. This is a courtesy call and so if there is no answer at the home number and we have not heard from you through the emergency physician on call, we will assume that you have returned to your regular daily activities without incident.  SIGNATURES/CONFIDENTIALITY: You and/or your care partner have signed paperwork which will be entered into your electronic medical record.  These signatures attest to the fact that that the information above on your After Visit Summary has been reviewed and is understood.  Full responsibility of the confidentiality of this   discharge information lies with you and/or your care-partner.   Try to increase the fiber in your diet due to your diverticulosis. You pathology results will be mailed to you within two weeks.  Use your anusol suppositories, and if it they don't work, you might consider a surgical consult.

## 2011-04-01 NOTE — Op Note (Signed)
[facility] [address] [place], [place]  [address]  COLONOSCOPY PROCEDURE REPORT  PATIENT:  Tony, Savage  MR#:  [id number] BIRTHDATE:  1955/02/01, 55 yrs. old  GENDER:  male ENDOSCOPIST:  Judie Petit T. Russella Dar, MD, [facility]  PROCEDURE DATE:  04/01/2011 PROCEDURE:  Colonoscopy with snare polypectomy ASA CLASS:  Class III INDICATIONS:  1) hematochezia MEDICATIONS:   MAC sedation, administered by CRNA, propofol (Diprivan) 300 mg IV DESCRIPTION OF PROCEDURE:   After the risks benefits and alternatives of the procedure were thoroughly explained, informed consent was obtained.  Digital rectal exam was performed and revealed no abnormalities.   The LB [id number]-[id number] endoscope was introduced through the anus and advanced to the cecum, which was identified by both the appendix and ileocecal valve, limited by sigmoid stenosis.  The quality of the prep was good, using MoviPrep.  The instrument was then slowly withdrawn as the colon was fully examined. <<PROCEDUREIMAGES>> FINDINGS:  Stenosis was found in the sigmoid colon. It was moderately severe and smooth.  Moderate diverticulosis was found in the sigmoid to descending colon. A sessile polyp was found in the descending colon. It was 5 mm in size. Polyp was snared without cautery. Retrieval was successful. A sessile polyp was found in the ascending colon. It was 8 mm in size. Polyp was snared without cautery. Retrieval was successful. 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 It was non-bleeding. It was 6 mm in size.  Scattered diverticula were found in the transverse colon. Otherwise normal colonoscopy without other polyps, masses, vascular ectasias, or inflammatory changes.  Retroflexed views in the rectum revealed internal hemorrhoids, moderate, erythematous.  The time to cecum =  6 minutes. The scope was then withdrawn (time =  11.5  min) from the patient and the procedure completed.  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 Otherwise follow colorectal cancer screening guidelines for "routine risk" patients with colonoscopy in 10 years. 3) Anusol HC supp bid prn, and surgical referral if hemorrhoids continue to bleed.  Venita Lick. Russella Dar, MD, [facility]  n. eSIGNED:   Venita Lick. Brennan Litzinger at 04/01/2011 09:45 AM  Henriette Combs, [phone]

## 2011-04-02 ENCOUNTER — Telehealth: Payer: Self-pay | Admitting: *Deleted

## 2011-04-02 NOTE — Telephone Encounter (Signed)
  Follow up Call-  Call back number 04/01/2011  Post procedure Call Back phone  # [phone]  Permission to leave phone message Yes     Patient questions:  Do you have a fever, pain , or abdominal swelling? no Pain Score  0 *  Have you tolerated food without any problems? yes  Have you been able to return to your normal activities? yes  Do you have any questions about your discharge instructions: Diet   no Medications  no Follow up visit  no  Do you have questions or concerns about your Care? no  Actions: * If pain score is 4 or above: No action needed, pain <4.

## 2011-04-07 ENCOUNTER — Other Ambulatory Visit: Payer: Self-pay | Admitting: Gastroenterology

## 2011-04-07 MED ORDER — HYDROCORTISONE ACETATE 25 MG RE SUPP: 25.0000 mg | Freq: Two times a day (BID) | RECTAL | Status: AC | PRN

## 2011-04-07 NOTE — Telephone Encounter (Signed)
Prescription sent to the pharmacy.

## 2011-04-12 ENCOUNTER — Encounter: Payer: Self-pay | Admitting: Gastroenterology

## 2011-04-29 ENCOUNTER — Other Ambulatory Visit (INDEPENDENT_AMBULATORY_CARE_PROVIDER_SITE_OTHER): Payer: 59

## 2011-04-29 DIAGNOSIS — R7302 Impaired glucose tolerance (oral): Secondary | ICD-10-CM

## 2011-04-29 DIAGNOSIS — Z0000 Encounter for general adult medical examination without abnormal findings: Secondary | ICD-10-CM

## 2011-04-29 DIAGNOSIS — R7309 Other abnormal glucose: Secondary | ICD-10-CM

## 2011-04-29 LAB — HEPATIC FUNCTION PANEL
ALT: 34 U/L (ref 0–53)
Total Bilirubin: 0.7 mg/dL (ref 0.3–1.2)

## 2011-04-29 LAB — CBC WITH DIFFERENTIAL/PLATELET
Basophils Relative: 0.3 % (ref 0.0–3.0)
Eosinophils Relative: 2.7 % (ref 0.0–5.0)
HCT: 36 % — ABNORMAL LOW (ref 39.0–52.0)
MCV: 100.3 fl — ABNORMAL HIGH (ref 78.0–100.0)
Monocytes Absolute: 0.6 10*3/uL (ref 0.1–1.0)
Neutrophils Relative %: 63.3 % (ref 43.0–77.0)
RBC: 3.59 Mil/uL — ABNORMAL LOW (ref 4.22–5.81)
WBC: 6.6 10*3/uL (ref 4.5–10.5)

## 2011-04-29 LAB — LIPID PANEL
Cholesterol: 201 mg/dL — ABNORMAL HIGH (ref 0–200)
HDL: 48.5 mg/dL (ref >39.00)
Triglycerides: 136 mg/dL (ref 0.0–149.0)
VLDL: 27.2 mg/dL (ref 0.0–40.0)

## 2011-04-29 LAB — URINALYSIS, ROUTINE W REFLEX MICROSCOPIC
Bilirubin Urine: NEGATIVE
Nitrite: NEGATIVE
Specific Gravity, Urine: 1.02 (ref 1.000–1.030)
Total Protein, Urine: NEGATIVE
pH: 6 (ref 5.0–8.0)

## 2011-04-29 LAB — LDL CHOLESTEROL, DIRECT: Direct LDL: 134.8 mg/dL

## 2011-04-29 LAB — PSA: PSA: 1.1 ng/mL (ref 0.10–4.00)

## 2011-04-29 LAB — BASIC METABOLIC PANEL
BUN: 21 mg/dL (ref 6–23)
Creatinine, Ser: 1.3 mg/dL (ref 0.4–1.5)
GFR: 76.02 mL/min (ref >60.00)

## 2011-04-29 LAB — HEMOGLOBIN A1C: Hgb A1c MFr Bld: 5.1 % (ref 4.6–6.5)

## 2011-05-02 ENCOUNTER — Ambulatory Visit: Payer: 59 | Admitting: Internal Medicine

## 2011-05-03 ENCOUNTER — Other Ambulatory Visit: Payer: Self-pay | Admitting: Internal Medicine

## 2011-05-06 ENCOUNTER — Encounter: Payer: Self-pay | Admitting: Internal Medicine

## 2011-05-06 ENCOUNTER — Ambulatory Visit (INDEPENDENT_AMBULATORY_CARE_PROVIDER_SITE_OTHER): Payer: 59 | Admitting: Internal Medicine

## 2011-05-06 VITALS — BP 122/70 | HR 66 | Temp 97.3°F | Ht 74.0 in | Wt 309.2 lb

## 2011-05-06 DIAGNOSIS — M5416 Radiculopathy, lumbar region: Secondary | ICD-10-CM | POA: Insufficient documentation

## 2011-05-06 DIAGNOSIS — K219 Gastro-esophageal reflux disease without esophagitis: Secondary | ICD-10-CM

## 2011-05-06 DIAGNOSIS — R7309 Other abnormal glucose: Secondary | ICD-10-CM

## 2011-05-06 DIAGNOSIS — R7302 Impaired glucose tolerance (oral): Secondary | ICD-10-CM

## 2011-05-06 DIAGNOSIS — IMO0002 Reserved for concepts with insufficient information to code with codable children: Secondary | ICD-10-CM

## 2011-05-06 DIAGNOSIS — Z0000 Encounter for general adult medical examination without abnormal findings: Secondary | ICD-10-CM

## 2011-05-06 MED ORDER — PANTOPRAZOLE SODIUM 40 MG PO TBEC: 40.0000 mg | DELAYED_RELEASE_TABLET | Freq: Every day | ORAL | Status: DC

## 2011-05-06 MED ORDER — FENOFIBRATE 160 MG PO TABS: 160.0000 mg | ORAL_TABLET | Freq: Every day | ORAL | Status: DC

## 2011-05-06 NOTE — Assessment & Plan Note (Signed)

## 2011-05-06 NOTE — Patient Instructions (Signed)
Take all new medications as prescribed - the generic protonix for reflux OK to stop the omeprazole 40 mg You will be contacted regarding the referral for: MRI for the lower back, and Neurosurgury referral Continue all other medications as before, including the cholesterol medication every day You are otherwise up to date with prevention Please have the pharmacy call with any refills you may need. Please return in 1 year for your yearly visit, or sooner if needed, with Lab testing done 3-5 days before

## 2011-05-06 NOTE — Progress Notes (Signed)
Subjective:    Patient ID: Tony Savage, male    DOB: 1955/10/01, 56 y.o.   MRN: [id number]  HPI  Here for wellness and f/u;  Overall doing ok;  Pt denies CP, worsening SOB, DOE, wheezing, orthopnea, PND, worsening LE edema, palpitations, dizziness or syncope.  Pt denies neurological change such as new Headache, facial or extremity weakness.  Pt denies polydipsia, polyuria, or low sugar symptoms. Pt states overall good compliance with treatment and medications, good tolerability, and trying to follow lower cholesterol diet.  Pt denies worsening depressive symptoms, suicidal ideation or panic. No fever, wt loss, night sweats, loss of appetite, or other constitutional symptoms.  Pt states good ability with ADL's, low fall risk, home safety reviewed and adequate, no significant changes in hearing or vision, and occasionally active with exercise.  Has had some wt increase due to decreaed activity -  Has had 2 mo worsening numbness and pain to distal RLE so that he cant stand to do dishes , and cant walk about [employer].  Has hx of lumbarr disc disesae and rec'd for surgury about 5 yrs ago per neurosurgeon in [place], but declined at that time, now wanting to re-consider.  Also some increased reflux recent without dysphagia, abd pain, n/v or blood on current med.  Admits to not taking the statin daily, but states will be more diligent in the future. Past Medical History  Diagnosis Date  . GLUCOSE INTOLERANCE 12/31/2006  . HYPERLIPIDEMIA 09/29/2006  . 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 OBESITY 09/29/2006  . ERECTILE DYSFUNCTION 12/31/2006  . HYPERTENSION 09/29/2006  . HEMORRHOIDS 09/29/2006  . GERD 09/29/2006  . ABSCESS, GLUTEAL 06/14/2009  . KNEE PAIN, LEFT 10/18/2008  . LOW BACK PAIN 12/31/2006  . SYNCOPE, VASOVAGAL 03/14/2008  . HYPERSOMNIA 04/30/2009  . FEVER UNSPECIFIED 06/08/2007  . Impaired fasting glucose 01/01/2007  . VERTEBRAL FRACTURE 09/29/2006  . BACK PAIN 01/16/2010  . Diverticulosis   . Anal fissure     . Sleep apnea    Past Surgical History  Procedure Date  . Hemorrhoid surgery   . Fistula-in-ano repair   . Drainage of recurrent perirectal abceses     reports that he quit smoking about 14 years ago. He has never used smokeless tobacco. He reports that he drinks alcohol. He reports that he does not use illicit drugs. family history includes Allergies in his sister; Colon polyps in his brother; Diabetes in his mother; Heart disease in his father; and Prostate cancer in his father. No Known Allergies Current Outpatient Prescriptions on File Prior to Visit  Medication Sig Dispense Refill  . aspirin 81 MG EC tablet Take 81 mg by mouth daily.        . cyclobenzaprine (FLEXERIL) 5 MG tablet TAKE 1 TABLET BY MOUTH 3 TIMES A DAY AS NEEDED  90 tablet  1  . fenofibrate 160 MG tablet Take 1 tablet (160 mg total) by mouth daily.  30 tablet  11  . hydrocortisone (ANUSOL-HC) 25 MG suppository Place 1 suppository (25 mg total) rectally 2 (two) times daily as needed for hemorrhoids.  12 suppository  1  . lisinopril-hydrochlorothiazide (PRINZIDE,ZESTORETIC) 20-12.5 MG per tablet TAKE 2 TABLET BY MOUTH ONCE A DAY  180 tablet  3  . pantoprazole (PROTONIX) 40 MG tablet Take 1 tablet (40 mg total) by mouth daily.  30 tablet  11   Review of Systems Review of Systems  Constitutional: Negative for diaphoresis, activity change, appetite change and unexpected weight change.  HENT: Negative  for hearing loss, ear pain, facial swelling, mouth sores and neck stiffness.   Eyes: Negative for pain, redness and visual disturbance.  Respiratory: Negative for shortness of breath and wheezing.   Cardiovascular: Negative for chest pain and palpitations.  Gastrointestinal: Negative for diarrhea, blood in stool, abdominal distention and rectal pain.  Genitourinary: Negative for hematuria, flank pain and decreased urine volume.  Musculoskeletal: Negative for myalgias and joint swelling.  Skin: Negative for color change and  wound.  Neurological: Negative for syncope and numbness.  Hematological: Negative for adenopathy.  Psychiatric/Behavioral: Negative for hallucinations, self-injury, decreased concentration and agitation.      Objective:   Physical Exam BP 122/70  Pulse 66  Temp(Src) 97.3 F (36.3 C) (Oral)  Ht 6\' 2"  (1.88 m)  Wt 309 lb 4 oz (140.275 kg)  BMI 39.71 kg/m2  SpO2 97% Physical Exam  VS noted Constitutional: Pt is oriented to person, place, and time. Appears well-developed and well-nourished.  HENT:  Head: Normocephalic and atraumatic.  Right Ear: External ear normal.  Left Ear: External ear normal.  Nose: Nose normal.  Mouth/Throat: Oropharynx is clear and moist.  Eyes: Conjunctivae and EOM are normal. Pupils are equal, round, and reactive to light.  Neck: Normal range of motion. Neck supple. No JVD present. No tracheal deviation present.  Cardiovascular: Normal rate, regular rhythm, normal heart sounds and intact distal pulses.   Pulmonary/Chest: Effort normal and breath sounds normal.  Abdominal: Soft. Bowel sounds are normal. There is no tenderness.  Musculoskeletal: Normal range of motion. Exhibits no edema.  Lymphadenopathy:  Has no cervical adenopathy.  Neurological: Pt is alert and oriented to person, place, and time. Pt has normal reflexes. No cranial nerve deficit. O/w not done in detail Skin: Skin is warm and dry. No rash noted.  Psychiatric:  Has  normal mood and affect. Behavior is normal.  Spine: nontender    Assessment & Plan:

## 2011-05-07 ENCOUNTER — Telehealth: Payer: Self-pay

## 2011-05-07 DIAGNOSIS — Z0000 Encounter for general adult medical examination without abnormal findings: Secondary | ICD-10-CM

## 2011-05-07 NOTE — Telephone Encounter (Signed)
Put lab order in. 

## 2011-05-11 ENCOUNTER — Encounter: Payer: Self-pay | Admitting: Internal Medicine

## 2011-05-11 NOTE — Assessment & Plan Note (Signed)
Ok to change to protonix 40 mg

## 2011-05-11 NOTE — Assessment & Plan Note (Signed)
For MRI, and NS referral

## 2011-05-11 NOTE — Assessment & Plan Note (Signed)
Asympt, for a1c, cont same tx

## 2011-06-11 ENCOUNTER — Encounter: Payer: Self-pay | Admitting: Internal Medicine

## 2011-06-11 ENCOUNTER — Ambulatory Visit
Admission: RE | Admit: 2011-06-11 | Discharge: 2011-06-11 | Disposition: A | Discharge status: Home / Self Care | Payer: 59 | Source: Ambulatory Visit | Attending: Internal Medicine | Admitting: Internal Medicine

## 2011-06-11 DIAGNOSIS — M5416 Radiculopathy, lumbar region: Secondary | ICD-10-CM

## 2011-06-13 ENCOUNTER — Other Ambulatory Visit: Payer: 59

## 2011-10-29 ENCOUNTER — Ambulatory Visit (INDEPENDENT_AMBULATORY_CARE_PROVIDER_SITE_OTHER): Payer: 59 | Admitting: Internal Medicine

## 2011-10-29 ENCOUNTER — Encounter: Payer: Self-pay | Admitting: Internal Medicine

## 2011-10-29 VITALS — BP 112/70 | HR 61 | Temp 96.8°F | Ht 74.0 in | Wt 307.4 lb

## 2011-10-29 DIAGNOSIS — L0291 Cutaneous abscess, unspecified: Secondary | ICD-10-CM

## 2011-10-29 DIAGNOSIS — I10 Essential (primary) hypertension: Secondary | ICD-10-CM

## 2011-10-29 DIAGNOSIS — G8929 Other chronic pain: Secondary | ICD-10-CM

## 2011-10-29 DIAGNOSIS — R7309 Other abnormal glucose: Secondary | ICD-10-CM

## 2011-10-29 DIAGNOSIS — R7302 Impaired glucose tolerance (oral): Secondary | ICD-10-CM

## 2011-10-29 DIAGNOSIS — L0390 Cellulitis, unspecified: Secondary | ICD-10-CM

## 2011-10-29 DIAGNOSIS — Z0000 Encounter for general adult medical examination without abnormal findings: Secondary | ICD-10-CM

## 2011-10-29 DIAGNOSIS — M545 Low back pain: Secondary | ICD-10-CM

## 2011-10-29 MED ORDER — DOXYCYCLINE HYCLATE 100 MG PO TABS: 100.0000 mg | ORAL_TABLET | Freq: Two times a day (BID) | ORAL | Status: DC

## 2011-10-29 NOTE — Patient Instructions (Addendum)
Take all new medications as prescribed - the doxycycline Continue all other medications as before Please call if not better in 1-2 days for ENT referral for the abscess You are given the work note today Please return in 6 mo with Lab testing done 3-5 days before

## 2011-11-01 ENCOUNTER — Encounter: Payer: Self-pay | Admitting: Internal Medicine

## 2011-11-01 DIAGNOSIS — G8929 Other chronic pain: Secondary | ICD-10-CM | POA: Insufficient documentation

## 2011-11-01 DIAGNOSIS — M545 Low back pain: Secondary | ICD-10-CM | POA: Insufficient documentation

## 2011-11-01 DIAGNOSIS — L0291 Cutaneous abscess, unspecified: Secondary | ICD-10-CM | POA: Insufficient documentation

## 2011-11-01 NOTE — Assessment & Plan Note (Signed)
stable overall by hx and exam, , and pt to continue medical treatment as before, for pain management to continue and f/u with NS (but pt declines for now, will cont to consider)

## 2011-11-01 NOTE — Assessment & Plan Note (Signed)
Mild to mod, for antibx course,  to f/u any worsening symptoms or concerns 

## 2011-11-01 NOTE — Assessment & Plan Note (Signed)
stable overall by hx and exam, most recent data reviewed with pt, and pt to continue medical treatment as before BP Readings from Last 3 Encounters:  10/29/11 112/70  05/06/11 122/70  04/01/11 140/72

## 2011-11-01 NOTE — Progress Notes (Signed)
Subjective:    Patient ID: Tony Savage, male    DOB: Jan 09, 1956, 56 y.o.   MRN: [id number]  HPI  Here to f/u;  C/o 3 days red/tender/swelling area with some drainage to area just behind the right ear, some fever noted, not sure how high, and some HA, but no ear/sinus/neck pain or ST,  Pt denies chest pain, increased sob or doe, wheezing, orthopnea, PND, increased LE swelling, palpitations, dizziness or syncope.   Pt denies polydipsia, polyuria.  Pt denies new neurological symptoms such as new headache, or facial or extremity weakness or numbness.  Pt continues to have recurring LBP without change in severity, bowel or bladder change, fever, wt loss,  worsening LE pain/numbness/weakness, gait change or falls; has seen Dr Hirsch/NS who did not recommend surgury after MRI and pt eval;  ESI recommended but pt has declined so far Past Medical History  Diagnosis Date  . GLUCOSE INTOLERANCE 12/31/2006  . HYPERLIPIDEMIA 09/29/2006  . 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 OBESITY 09/29/2006  . ERECTILE DYSFUNCTION 12/31/2006  . HYPERTENSION 09/29/2006  . HEMORRHOIDS 09/29/2006  . GERD 09/29/2006  . ABSCESS, GLUTEAL 06/14/2009  . KNEE PAIN, LEFT 10/18/2008  . LOW BACK PAIN 12/31/2006  . SYNCOPE, VASOVAGAL 03/14/2008  . HYPERSOMNIA 04/30/2009  . FEVER UNSPECIFIED 06/08/2007  . Impaired fasting glucose 01/01/2007  . VERTEBRAL FRACTURE 09/29/2006  . BACK PAIN 01/16/2010  . Diverticulosis   . Anal fissure   . Sleep apnea    Past Surgical History  Procedure Date  . Hemorrhoid surgery   . Fistula-in-ano repair   . Drainage of recurrent perirectal abceses     reports that he quit smoking about 14 years ago. He has never used smokeless tobacco. He reports that he drinks alcohol. He reports that he does not use illicit drugs. family history includes Allergies in his sister; Colon polyps in his brother; Diabetes in his mother; Heart disease in his father; and Prostate cancer in his father. No Known Allergies Current  Outpatient Prescriptions on File Prior to Visit  Medication Sig Dispense Refill  . aspirin 81 MG EC tablet Take 81 mg by mouth daily.        . cyclobenzaprine (FLEXERIL) 5 MG tablet TAKE 1 TABLET BY MOUTH 3 TIMES A DAY AS NEEDED  90 tablet  1  . fenofibrate 160 MG tablet Take 1 tablet (160 mg total) by mouth daily.  30 tablet  11  . hydrocortisone (ANUSOL-HC) 25 MG suppository Place 1 suppository (25 mg total) rectally 2 (two) times daily as needed for hemorrhoids.  12 suppository  1  . lisinopril-hydrochlorothiazide (PRINZIDE,ZESTORETIC) 20-12.5 MG per tablet TAKE 2 TABLET BY MOUTH ONCE A DAY  180 tablet  3  . pantoprazole (PROTONIX) 40 MG tablet Take 1 tablet (40 mg total) by mouth daily.  30 tablet  11   Review of Systems  Constitutional: Negative for diaphoresis and unexpected weight change.  HENT: Negative for tinnitus.   Eyes: Negative for photophobia and visual disturbance.  Respiratory: Negative for choking and stridor.   Gastrointestinal: Negative for vomiting and blood in stool.  Genitourinary: Negative for hematuria and decreased urine volume.  Musculoskeletal: Negative for gait problem.  Skin: Negative for color change and wound.  Neurological: Negative for tremors and numbness.  Psychiatric/Behavioral: Negative for decreased concentration. The patient is not hyperactive.       Objective:   Physical Exam BP 112/70  Pulse 61  Temp 96.8 F (36 C) (Oral)  Ht 6\' 2"  (  1.88 m)  Wt 307 lb 6 oz (139.424 kg)  BMI 39.46 kg/m2  SpO2 97% Physical Exam  VS noted, not ill appaering Constitutional: Pt appears well-developed and well-nourished.  HENT: Head: Normocephalic.  Right Ear: External ear normal.  Left Ear: External ear normal.  Right TM and canal normal appearance - no red/swelling Eyes: Conjunctivae and EOM are normal. Pupils are equal, round, and reactive to light.  Neck: Normal range of motion. Neck supple.  Cardiovascular: Normal rate and regular rhythm.     Pulmonary/Chest: Effort normal and breath sounds normal.  Neurological: Pt is alert. Not confused  Skin: Skin is warm. No erythema. except for 1 cm area near the right mastoid area red/swelling/tender, appears to have spontaneusly drained, nonfluctuant/nondraining Psychiatric: Pt behavior is normal. Thought content normal.     Assessment & Plan:

## 2011-11-01 NOTE — Assessment & Plan Note (Signed)
stable overall by hx and exam, most recent data reviewed with pt, and pt to continue medical treatment as before Lab Results  Component Value Date   HGBA1C 5.1 04/29/2011

## 2011-11-17 ENCOUNTER — Other Ambulatory Visit: Payer: Self-pay | Admitting: Internal Medicine

## 2011-11-17 ENCOUNTER — Telehealth: Payer: Self-pay | Admitting: Internal Medicine

## 2011-11-17 MED ORDER — DOXYCYCLINE HYCLATE 100 MG PO TABS: 100.0000 mg | ORAL_TABLET | Freq: Two times a day (BID) | ORAL | Status: DC

## 2011-11-17 NOTE — Telephone Encounter (Signed)
Caller: Delonte/Patient; Patient Name: Tony Savage; PCP: Oliver Barre (Adults only); Best Callback Phone Number: [phone].  Pt reports he was seen in the office on 10/2  for multiple boils.  Pt  States he was prescribed PCN, per EPIC Doxycycline was ordered.  Pt reports he has been squeezing the boil on his ear lobe and it is now irritated and more swollen.  Pt is afebrile.  Triaged patient per Skin Lesions Protocol.  See Provider within 24 hours Disposition for 'New signs and symptoms of local infection'.  Pt did not want to come back to office for appt, since he was just seen for same symptoms.  Pt is requesting more antibiotic because that was making the boil improve and he is out of the medication now.  OFFICE, please follow up with pt if more medication can be called in.  Pt uses [facility] on [address]

## 2011-11-17 NOTE — Telephone Encounter (Signed)
Done erx - second course of doxycycline

## 2011-11-17 NOTE — Telephone Encounter (Signed)
Patient informed. 

## 2012-01-28 ENCOUNTER — Other Ambulatory Visit: Payer: Self-pay | Admitting: Internal Medicine

## 2012-04-25 ENCOUNTER — Other Ambulatory Visit: Payer: Self-pay | Admitting: Internal Medicine

## 2012-04-30 ENCOUNTER — Other Ambulatory Visit: Payer: Self-pay

## 2012-04-30 ENCOUNTER — Other Ambulatory Visit (INDEPENDENT_AMBULATORY_CARE_PROVIDER_SITE_OTHER): Payer: 59

## 2012-04-30 DIAGNOSIS — R7309 Other abnormal glucose: Secondary | ICD-10-CM

## 2012-04-30 DIAGNOSIS — Z0000 Encounter for general adult medical examination without abnormal findings: Secondary | ICD-10-CM

## 2012-04-30 DIAGNOSIS — R7302 Impaired glucose tolerance (oral): Secondary | ICD-10-CM

## 2012-04-30 LAB — CBC WITH DIFFERENTIAL/PLATELET
Eosinophils Relative: 3.2 % (ref 0.0–5.0)
Monocytes Absolute: 0.5 10*3/uL (ref 0.1–1.0)
Monocytes Relative: 8.2 % (ref 3.0–12.0)
Neutrophils Relative %: 58.1 % (ref 43.0–77.0)
Platelets: 256 10*3/uL (ref 150.0–400.0)
WBC: 5.8 10*3/uL (ref 4.5–10.5)

## 2012-04-30 LAB — URINALYSIS, ROUTINE W REFLEX MICROSCOPIC
Bilirubin Urine: NEGATIVE
Leukocytes, UA: NEGATIVE
Nitrite: NEGATIVE
Specific Gravity, Urine: 1.015 (ref 1.000–1.030)
pH: 8 (ref 5.0–8.0)

## 2012-04-30 LAB — HEPATIC FUNCTION PANEL
ALT: 52 U/L (ref 0–53)
AST: 46 U/L — ABNORMAL HIGH (ref 0–37)
Albumin: 4.2 g/dL (ref 3.5–5.2)
Total Protein: 6.9 g/dL (ref 6.0–8.3)

## 2012-04-30 LAB — BASIC METABOLIC PANEL
BUN: 14 mg/dL (ref 6–23)
Chloride: 103 mEq/L (ref 96–112)
Glucose, Bld: 108 mg/dL — ABNORMAL HIGH (ref 70–99)
Potassium: 4.5 mEq/L (ref 3.5–5.1)

## 2012-04-30 LAB — PSA: PSA: 1.1 ng/mL (ref 0.10–4.00)

## 2012-04-30 LAB — TSH: TSH: 1.3 u[IU]/mL (ref 0.35–5.50)

## 2012-04-30 LAB — HEMOGLOBIN A1C: Hgb A1c MFr Bld: 5.3 % (ref 4.6–6.5)

## 2012-04-30 LAB — LIPID PANEL: Cholesterol: 162 mg/dL (ref 0–200)

## 2012-04-30 MED ORDER — LISINOPRIL-HYDROCHLOROTHIAZIDE 20-12.5 MG PO TABS: 1.0000 | ORAL_TABLET | Freq: Every day | ORAL | Status: DC

## 2012-04-30 MED ORDER — FENOFIBRATE 160 MG PO TABS: 160.0000 mg | ORAL_TABLET | Freq: Every day | ORAL | Status: DC

## 2012-04-30 MED ORDER — PANTOPRAZOLE SODIUM 40 MG PO TBEC: 40.0000 mg | DELAYED_RELEASE_TABLET | Freq: Every day | ORAL | Status: DC

## 2012-05-06 ENCOUNTER — Ambulatory Visit (INDEPENDENT_AMBULATORY_CARE_PROVIDER_SITE_OTHER): Payer: 59 | Admitting: Internal Medicine

## 2012-05-06 ENCOUNTER — Encounter: Payer: Self-pay | Admitting: Internal Medicine

## 2012-05-06 VITALS — BP 128/70 | HR 64 | Temp 97.9°F | Ht 74.0 in | Wt 315.5 lb

## 2012-05-06 DIAGNOSIS — R7309 Other abnormal glucose: Secondary | ICD-10-CM

## 2012-05-06 DIAGNOSIS — Z0000 Encounter for general adult medical examination without abnormal findings: Secondary | ICD-10-CM

## 2012-05-06 DIAGNOSIS — R7302 Impaired glucose tolerance (oral): Secondary | ICD-10-CM

## 2012-05-06 MED ORDER — LISINOPRIL-HYDROCHLOROTHIAZIDE 20-12.5 MG PO TABS: 1.0000 | ORAL_TABLET | Freq: Every day | ORAL | Status: DC

## 2012-05-06 NOTE — Progress Notes (Signed)
Subjective:    Patient ID: Tony Savage, male    DOB: 09/12/1955, 57 y.o.   MRN: [id number]  HPI  Here for wellness and f/u;  Overall doing ok;  Pt denies CP, worsening SOB, DOE, wheezing, orthopnea, PND, worsening LE edema, palpitations, dizziness or syncope.  Pt denies neurological change such as new headache, facial or extremity weakness.  Pt denies polydipsia, polyuria, or low sugar symptoms. Pt states overall good compliance with treatment and medications, good tolerability, and has been trying to follow lower cholesterol diet.  Pt denies worsening depressive symptoms, suicidal ideation or panic. No fever, night sweats, wt loss, loss of appetite, or other constitutional symptoms.  Pt states good ability with ADL's, has low fall risk, home safety reviewed and adequate, no other significant changes in hearing or vision, and only occasionally active with exercise.  Plans to retire in dec 2014. Pt continues to have recurring LBP without change in severity, bowel or bladder change, fever, wt loss,  worsening LE pain/numbness/weakness, gait change or falls, but has pain daily, and recurrent RLE numbness to thigh, not surgical per Dr Phoebe Perch Past Medical History  Diagnosis Date  . GLUCOSE INTOLERANCE 12/31/2006  . HYPERLIPIDEMIA 09/29/2006  . 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 OBESITY 09/29/2006  . ERECTILE DYSFUNCTION 12/31/2006  . HYPERTENSION 09/29/2006  . HEMORRHOIDS 09/29/2006  . GERD 09/29/2006  . ABSCESS, GLUTEAL 06/14/2009  . KNEE PAIN, LEFT 10/18/2008  . LOW BACK PAIN 12/31/2006  . SYNCOPE, VASOVAGAL 03/14/2008  . HYPERSOMNIA 04/30/2009  . FEVER UNSPECIFIED 06/08/2007  . Impaired fasting glucose 01/01/2007  . VERTEBRAL FRACTURE 09/29/2006  . BACK PAIN 01/16/2010  . Diverticulosis   . Anal fissure   . Sleep apnea    Past Surgical History  Procedure Laterality Date  . Hemorrhoid surgery    . Fistula-in-ano repair    . Drainage of recurrent perirectal abceses      reports that he quit smoking  about 15 years ago. He has never used smokeless tobacco. He reports that  drinks alcohol. He reports that he does not use illicit drugs. family history includes Allergies in his sister; Colon polyps in his brother; Diabetes in his mother; Heart disease in his father; and Prostate cancer in his father. No Known Allergies Current Outpatient Prescriptions on File Prior to Visit  Medication Sig Dispense Refill  . aspirin 81 MG EC tablet Take 81 mg by mouth daily.        . cyclobenzaprine (FLEXERIL) 5 MG tablet TAKE 1 TABLET BY MOUTH 3 TIMES A DAY AS NEEDED  90 tablet  1  . fenofibrate 160 MG tablet Take 1 tablet (160 mg total) by mouth daily.  90 tablet  3  . pantoprazole (PROTONIX) 40 MG tablet Take 1 tablet (40 mg total) by mouth daily.  90 tablet  2  . CIALIS 20 MG tablet TAKE 1 TABLET BY MOUTH  5 tablet  0   No current facility-administered medications on file prior to visit.    Review of Systems Constitutional: Negative for diaphoresis, activity change, appetite change or unexpected weight change.  HENT: Negative for hearing loss, ear pain, facial swelling, mouth sores and neck stiffness.   Eyes: Negative for pain, redness and visual disturbance.  Respiratory: Negative for shortness of breath and wheezing.   Cardiovascular: Negative for chest pain and palpitations.  Gastrointestinal: Negative for diarrhea, blood in stool, abdominal distention or other pain Genitourinary: Negative for hematuria, flank pain or change in urine volume.  Musculoskeletal: Negative  for myalgias and joint swelling.  Skin: Negative for color change and wound.  Neurological: Negative for syncope and numbness. other than noted Hematological: Negative for adenopathy.  Psychiatric/Behavioral: Negative for hallucinations, self-injury, decreased concentration and agitation.      Objective:   Physical Exam BP 128/70  Pulse 64  Temp(Src) 97.9 F (36.6 C) (Oral)  Ht 6\' 2"  (1.88 m)  Wt 315 lb 8 oz (143.11 kg)  BMI  40.49 kg/m2  SpO2 96% VS noted,  Constitutional: Pt is oriented to person, place, and time. Appears well-developed and well-nourished.  Head: Normocephalic and atraumatic.  Right Ear: External ear normal.  Left Ear: External ear normal.  Nose: Nose normal.  Mouth/Throat: Oropharynx is clear and moist.  Eyes: Conjunctivae and EOM are normal. Pupils are equal, round, and reactive to light.  Neck: Normal range of motion. Neck supple. No JVD present. No tracheal deviation present.  Cardiovascular: Normal rate, regular rhythm, normal heart sounds and intact distal pulses.   Pulmonary/Chest: Effort normal and breath sounds normal.  Abdominal: Soft. Bowel sounds are normal. There is no tenderness. No HSM  Musculoskeletal: Normal range of motion. Exhibits no edema.  Lymphadenopathy:  Has no cervical adenopathy.  Neurological: Pt is alert and oriented to person, place, and time. Pt has normal reflexes. No cranial nerve deficit.  Skin: Skin is warm and dry. No rash noted.  Psychiatric:  Has  normal mood and affect. Behavior is normal.     Assessment & Plan:

## 2012-05-06 NOTE — Assessment & Plan Note (Signed)

## 2012-05-06 NOTE — Assessment & Plan Note (Signed)
stable overall by history and exam, recent data reviewed with pt, and pt to continue medical treatment as before,  to f/u any worsening symptoms or concerns Lab Results  Component Value Date   HGBA1C 5.3 04/30/2012

## 2012-05-06 NOTE — Patient Instructions (Addendum)
Please continue all other medications as before, and refills have been done if requested. Please continue your efforts at being more active, low cholesterol diet, and weight control. You are otherwise up to date with prevention measures today. Thank you for enrolling in MyChart. Please follow the instructions below to securely access your online medical record. MyChart allows you to send messages to your doctor, view your test results, renew your prescriptions, schedule appointments, and more. To Log into My Chart online, please go by [employer] or [employer] to [email or web address], or download the MyChart App from the [employer] of [employer] .  Your Username is: [email or web address] (pass pepper) Please send a practice Message on Mychart later today. Please return in 1 year for your yearly visit, or sooner if needed, with Lab testing done 3-5 days before

## 2012-05-13 ENCOUNTER — Telehealth: Payer: Self-pay | Admitting: Internal Medicine

## 2012-05-13 MED ORDER — DOXYCYCLINE HYCLATE 100 MG PO TABS: 100.0000 mg | ORAL_TABLET | Freq: Two times a day (BID) | ORAL | Status: DC

## 2012-05-13 NOTE — Telephone Encounter (Signed)
Patient Information:  Caller Name: Amaan  Phone: [phone]  Patient: Tony Savage, Tony Savage  Gender: Male  DOB: 07-26-1955  Age: 57 Years  PCP: Oliver Barre (Adults only)  Office Follow Up:  Does the office need to follow up with this patient?: Yes  Instructions For The Office: Patient requests antibiotic for recurrent "boil" at anus.   Symptoms  Reason For Call & Symptoms: Patient reports he has boil on the buttocks/ anus  and asks for Penicillin to make them go away.  He reports he was seen 05/06/12.  Advised of policy against antibiotics without being seen; he persisted that he was in office this week.  Pain rated at 5 of 10.  Patient states he is going out of town tonight and he will not be able to see MD before next week.  See Today or Tomorrow in Office per Rectal Symptoms protocol. Note to office per caller request.  Reviewed Health History In EMR: Yes  Reviewed Medications In EMR: Yes  Reviewed Allergies In EMR: Yes  Reviewed Surgeries / Procedures: Yes  Date of Onset of Symptoms: 05/10/2012  Treatments Tried: Sit in warm tub with Epsom salts  Treatments Tried Worked: No  Guideline(s) Used:  Rectal Symptoms  Disposition Per Guideline:   See Today or Tomorrow in Office  Reason For Disposition Reached:   Home treatment > 3 days for rectal pain and not improved  Advice Given:  Treatment of Mild Rectal Pain:  Warm SITZ Bath Twice a Day - Sit in a warm saline bath for 20 minutes bid to cleanse the area and to promote healing. Add 2 ounces (57 grams) of table salt or baking soda to each tub of water. Afterwards, gently pat area dry with unscented toilet paper.  Call Back If:  Severe rectal pain or itching  Rectal pain or itching lasts over 3 days  You become worse.  Patient Refused Recommendation:  Patient Requests Prescription  Asks for antibiotic

## 2012-05-13 NOTE — Telephone Encounter (Signed)
Was seen for similar abscess in oct 2013, ok for doxy course - done erx, but needs OV or call for gen surgury referral if not improving in 1-2 days

## 2012-05-13 NOTE — Telephone Encounter (Signed)
Patient informed a prescription was sent in.  Also informed of MD instructions.

## 2012-07-16 ENCOUNTER — Telehealth: Payer: Self-pay

## 2012-07-16 MED ORDER — LISINOPRIL-HYDROCHLOROTHIAZIDE 20-12.5 MG PO TABS: 2.0000 | ORAL_TABLET | Freq: Every day | ORAL | Status: DC

## 2012-07-16 NOTE — Telephone Encounter (Signed)
Recent rx for lisinopril was for once daily and the patient states has always taken two per day.  ALSO he stopped his cholesterol medication due to body aches 2 weeks ago.  States his body aches have stopped.   Please advise on BP and cholesterol medication

## 2012-07-16 NOTE — Telephone Encounter (Signed)
BP rx corrected  The muscle aches possibly related to the fenofibrate would be quite unsual as it is not a statin, and has been taking it over a year without complaint of difficulty.  Muscle pain can occur for other reasons.  If doing ok now and no fever, weakness, I would ask to re-start and monitor for any other recurrence of symptoms

## 2012-07-16 NOTE — Telephone Encounter (Signed)
Patient informed rx corrected and informed of MD instructions on cholesterol medication.  The patient agreed to restart.

## 2012-10-06 ENCOUNTER — Telehealth: Payer: Self-pay | Admitting: *Deleted

## 2012-10-06 MED ORDER — DOXYCYCLINE HYCLATE 100 MG PO TABS: 100.0000 mg | ORAL_TABLET | Freq: Two times a day (BID) | ORAL | Status: DC

## 2012-10-06 NOTE — Telephone Encounter (Signed)
Patient informed of MD instructions. 

## 2012-10-06 NOTE — Telephone Encounter (Signed)
Ok for doxy course, but needs OV or referral to gen surgury if worse pain, fever, swelling, driainage

## 2012-10-06 NOTE — Telephone Encounter (Signed)
Pt called states he has another abscess on he buttocks.  Pt requests a Penicillin Rx.  Please advise

## 2012-12-02 ENCOUNTER — Other Ambulatory Visit: Payer: Self-pay

## 2013-02-04 ENCOUNTER — Other Ambulatory Visit: Payer: Self-pay | Admitting: Internal Medicine

## 2013-03-11 ENCOUNTER — Other Ambulatory Visit: Payer: Self-pay | Admitting: Internal Medicine

## 2013-03-29 ENCOUNTER — Telehealth: Payer: Self-pay

## 2013-03-29 DIAGNOSIS — Z0000 Encounter for general adult medical examination without abnormal findings: Secondary | ICD-10-CM

## 2013-03-29 NOTE — Telephone Encounter (Signed)
CPX labs entered  

## 2013-03-30 NOTE — Telephone Encounter (Signed)
A user error has taken place: encounter opened in error, closed for administrative reasons.

## 2013-05-18 ENCOUNTER — Other Ambulatory Visit: Payer: Self-pay | Admitting: Internal Medicine

## 2013-06-10 ENCOUNTER — Other Ambulatory Visit (INDEPENDENT_AMBULATORY_CARE_PROVIDER_SITE_OTHER): Payer: 59

## 2013-06-10 DIAGNOSIS — R7309 Other abnormal glucose: Secondary | ICD-10-CM

## 2013-06-10 DIAGNOSIS — Z0000 Encounter for general adult medical examination without abnormal findings: Secondary | ICD-10-CM

## 2013-06-10 DIAGNOSIS — R7302 Impaired glucose tolerance (oral): Secondary | ICD-10-CM

## 2013-06-10 LAB — TSH: TSH: 2.86 u[IU]/mL (ref 0.35–4.50)

## 2013-06-10 LAB — URINALYSIS, ROUTINE W REFLEX MICROSCOPIC
Bilirubin Urine: NEGATIVE
Hgb urine dipstick: NEGATIVE
Ketones, ur: NEGATIVE
LEUKOCYTES UA: NEGATIVE
Nitrite: NEGATIVE
PH: 6 (ref 5.0–8.0)
Total Protein, Urine: 30 — AB
Urine Glucose: NEGATIVE
Urobilinogen, UA: 2 — AB (ref 0.0–1.0)

## 2013-06-10 LAB — CBC WITH DIFFERENTIAL/PLATELET
BASOS ABS: 0 10*3/uL (ref 0.0–0.1)
Basophils Relative: 0.4 % (ref 0.0–3.0)
Eosinophils Absolute: 0.2 10*3/uL (ref 0.0–0.7)
Eosinophils Relative: 2.1 % (ref 0.0–5.0)
HCT: 35.6 % — ABNORMAL LOW (ref 39.0–52.0)
Hemoglobin: 12 g/dL — ABNORMAL LOW (ref 13.0–17.0)
LYMPHS PCT: 22 % (ref 12.0–46.0)
Lymphs Abs: 1.8 10*3/uL (ref 0.7–4.0)
MCHC: 33.8 g/dL (ref 30.0–36.0)
MCV: 97.6 fl (ref 78.0–100.0)
MONO ABS: 0.7 10*3/uL (ref 0.1–1.0)
Monocytes Relative: 8.2 % (ref 3.0–12.0)
NEUTROS PCT: 67.3 % (ref 43.0–77.0)
Neutro Abs: 5.5 10*3/uL (ref 1.4–7.7)
Platelets: 232 10*3/uL (ref 150.0–400.0)
RBC: 3.65 Mil/uL — ABNORMAL LOW (ref 4.22–5.81)
RDW: 12.8 % (ref 11.5–15.5)
WBC: 8.1 10*3/uL (ref 4.0–10.5)

## 2013-06-10 LAB — BASIC METABOLIC PANEL
BUN: 21 mg/dL (ref 6–23)
CHLORIDE: 103 meq/L (ref 96–112)
CO2: 26 meq/L (ref 19–32)
CREATININE: 1.2 mg/dL (ref 0.4–1.5)
Calcium: 9.4 mg/dL (ref 8.4–10.5)
GFR: 83.74 mL/min (ref >60.00)
Glucose, Bld: 107 mg/dL — ABNORMAL HIGH (ref 70–99)
Potassium: 3.8 mEq/L (ref 3.5–5.1)
Sodium: 137 mEq/L (ref 135–145)

## 2013-06-10 LAB — HEPATIC FUNCTION PANEL
ALBUMIN: 4 g/dL (ref 3.5–5.2)
ALK PHOS: 61 U/L (ref 39–117)
ALT: 29 U/L (ref 0–53)
AST: 29 U/L (ref 0–37)
BILIRUBIN DIRECT: 0.2 mg/dL (ref 0.0–0.3)
Total Bilirubin: 1 mg/dL (ref 0.2–1.2)
Total Protein: 6.5 g/dL (ref 6.0–8.3)

## 2013-06-10 LAB — LIPID PANEL
Cholesterol: 170 mg/dL (ref 0–200)
HDL: 37.5 mg/dL — ABNORMAL LOW (ref >39.00)
LDL CALC: 109 mg/dL — AB (ref 0–99)
TRIGLYCERIDES: 118 mg/dL (ref 0.0–149.0)
Total CHOL/HDL Ratio: 5
VLDL: 23.6 mg/dL (ref 0.0–40.0)

## 2013-06-10 LAB — HEMOGLOBIN A1C: Hgb A1c MFr Bld: 5.2 % (ref 4.6–6.5)

## 2013-06-10 LAB — PSA: PSA: 1.39 ng/mL (ref 0.10–4.00)

## 2013-06-11 ENCOUNTER — Other Ambulatory Visit: Payer: Self-pay | Admitting: Internal Medicine

## 2013-06-16 ENCOUNTER — Encounter: Payer: Self-pay | Admitting: Internal Medicine

## 2013-06-16 ENCOUNTER — Ambulatory Visit (INDEPENDENT_AMBULATORY_CARE_PROVIDER_SITE_OTHER): Payer: 59 | Admitting: Internal Medicine

## 2013-06-16 VITALS — BP 108/70 | HR 68 | Temp 98.3°F | Ht 74.0 in | Wt 313.4 lb

## 2013-06-16 DIAGNOSIS — Z0000 Encounter for general adult medical examination without abnormal findings: Secondary | ICD-10-CM

## 2013-06-16 DIAGNOSIS — G5711 Meralgia paresthetica, right lower limb: Secondary | ICD-10-CM | POA: Insufficient documentation

## 2013-06-16 DIAGNOSIS — G5710 Meralgia paresthetica, unspecified lower limb: Secondary | ICD-10-CM

## 2013-06-16 MED ORDER — PREDNISONE 10 MG PO TABS: ORAL_TABLET | ORAL | Status: DC

## 2013-06-16 NOTE — Patient Instructions (Addendum)
Please take all new medication as prescribed - the prednisone  Please continue all other medications as before, and refills have been done if requested. Please have the pharmacy call with any other refills you may need.  Please continue your efforts at being more active, low cholesterol diet, and weight control.  Please keep your appointments with your specialists as you have planned  Your Blood work and EKG were Tony Savage today  Please see Dr Tamala Julian [facility] for the left knee if it becomes worse  Please return in 1 year for your yearly visit, or sooner if needed, with Lab testing done 3-5 days before

## 2013-06-16 NOTE — Progress Notes (Signed)
Subjective:    Patient ID: Tony Savage, male    DOB: [birth year], 58 y.o.   MRN: [id number]  HPI  Here for wellness and f/u;  Overall doing ok;  Pt denies CP, worsening SOB, DOE, wheezing, orthopnea, PND, worsening LE edema, palpitations, dizziness or syncope.  Pt denies neurological change such as new headache, facial or extremity weakness.  Pt denies polydipsia, polyuria, or low sugar symptoms. Pt states overall good compliance with treatment and medications, good tolerability, and has been trying to follow lower cholesterol diet.  Pt denies worsening depressive symptoms, suicidal ideation or panic. No fever, night sweats, wt loss, loss of appetite, or other constitutional symptoms.  Pt states good ability with ADL's, has low fall risk, home safety reviewed and adequate, no other significant changes in hearing or vision, and only occasionally active with exercise, worse recently with ? right meralgia paresthetica symptoms - numb and aching prox ant right thigh  Asks for handicap parking form, and prednisone that helped his wife. Had similar symptoms 2013 , neg surgical eval.  Past Medical History  Diagnosis Date  . GLUCOSE INTOLERANCE 12/31/2006  . HYPERLIPIDEMIA 09/29/2006  . 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 OBESITY 09/29/2006  . ERECTILE DYSFUNCTION 12/31/2006  . HYPERTENSION 09/29/2006  . HEMORRHOIDS 09/29/2006  . GERD 09/29/2006  . ABSCESS, GLUTEAL 06/14/2009  . KNEE PAIN, LEFT 10/18/2008  . LOW BACK PAIN 12/31/2006  . SYNCOPE, VASOVAGAL 03/14/2008  . HYPERSOMNIA 04/30/2009  . FEVER UNSPECIFIED 06/08/2007  . Impaired fasting glucose 01/01/2007  . VERTEBRAL FRACTURE 09/29/2006  . BACK PAIN 01/16/2010  . Diverticulosis   . Anal fissure   . Sleep apnea    Past Surgical History  Procedure Laterality Date  . Hemorrhoid surgery    . Fistula-in-ano repair    . Drainage of recurrent perirectal abceses      reports that he quit smoking about 16 years ago. He has never used smokeless tobacco. He  reports that he drinks alcohol. He reports that he does not use illicit drugs. family history includes Allergies in his sister; Colon polyps in his brother; Diabetes in his mother; Heart disease in his father; Prostate cancer in his father. No Known Allergies Current Outpatient Prescriptions on File Prior to Visit  Medication Sig Dispense Refill  . aspirin 81 MG EC tablet Take 81 mg by mouth daily.        . fenofibrate 160 MG tablet TAKE 1 TABLET (160 MG TOTAL) BY MOUTH DAILY.  90 tablet  1  . lisinopril-hydrochlorothiazide (PRINZIDE,ZESTORETIC) 20-12.5 MG per tablet TAKE 2 TABLETS BY MOUTH DAILY.  180 tablet  0  . pantoprazole (PROTONIX) 40 MG tablet TAKE 1 TABLET (40 MG TOTAL) BY MOUTH DAILY.  90 tablet  0   No current facility-administered medications on file prior to visit.   Review of Systems Constitutional: Negative for increased diaphoresis, other activity, appetite or other siginficant weight change  HENT: Negative for worsening hearing loss, ear pain, facial swelling, mouth sores and neck stiffness.   Eyes: Negative for other worsening pain, redness or visual disturbance.  Respiratory: Negative for shortness of breath and wheezing.   Cardiovascular: Negative for chest pain and palpitations.  Gastrointestinal: Negative for diarrhea, blood in stool, abdominal distention or other pain Genitourinary: Negative for hematuria, flank pain or change in urine volume.  Musculoskeletal: Negative for myalgias or other joint complaints.  Skin: Negative for color change and wound.  Neurological: Negative for syncope and numbness. other than noted Hematological: Negative for  adenopathy. or other swelling Psychiatric/Behavioral: Negative for hallucinations, self-injury, decreased concentration or other worsening agitation.      Objective:   Physical Exam BP 108/70  Pulse 68  Temp(Src) 98.3 F (36.8 C) (Oral)  Ht 6\' 2"  (1.88 m)  Wt 313 lb 6 oz (142.146 kg)  BMI 40.22 kg/m2  SpO2 97% VS  noted,  Constitutional: Pt is oriented to person, place, and time. Appears well-developed and well-nourished.  Head: Normocephalic and atraumatic.  Right Ear: External ear normal.  Left Ear: External ear normal.  Nose: Nose normal.  Mouth/Throat: Oropharynx is clear and moist.  Eyes: Conjunctivae and EOM are normal. Pupils are equal, round, and reactive to light.  Neck: Normal range of motion. Neck supple. No JVD present. No tracheal deviation present.  Cardiovascular: Normal rate, regular rhythm, normal heart sounds and intact distal pulses.   Pulmonary/Chest: Effort normal and breath sounds without rales or wheezing  Abdominal: Soft. Bowel sounds are normal. NT. No HSM  Musculoskeletal: Normal range of motion. Exhibits no edema.  Lymphadenopathy:  Has no cervical adenopathy.  Neurological: Pt is alert and oriented to person, place, and time. Pt has normal reflexes. No cranial nerve deficit. Motor grossly intact. sens currently intact LE's Skin: Skin is warm and dry. No rash noted.  Psychiatric:  Has normal mood and affect. Behavior is normal.      Assessment & Plan:

## 2013-06-16 NOTE — Assessment & Plan Note (Signed)
For predpack asd,  to f/u any worsening symptoms or concerns

## 2013-06-16 NOTE — Assessment & Plan Note (Signed)

## 2013-06-16 NOTE — Progress Notes (Signed)
Pre visit review using our clinic review tool, if applicable. No additional management support is needed unless otherwise documented below in the visit note. 

## 2013-06-16 NOTE — Addendum Note (Signed)
Addended by: Biagio Borg on: 06/16/2013 03:50 PM   Modules accepted: Orders

## 2013-07-31 ENCOUNTER — Other Ambulatory Visit: Payer: Self-pay | Admitting: Internal Medicine

## 2013-08-15 ENCOUNTER — Other Ambulatory Visit: Payer: Self-pay | Admitting: Internal Medicine

## 2013-08-24 ENCOUNTER — Telehealth: Payer: Self-pay | Admitting: *Deleted

## 2013-08-24 MED ORDER — DOXYCYCLINE HYCLATE 100 MG PO TABS: 100.0000 mg | ORAL_TABLET | Freq: Two times a day (BID) | ORAL | Status: DC

## 2013-08-24 NOTE — Telephone Encounter (Signed)
Notified pt with md response.../lmb 

## 2013-08-24 NOTE — Telephone Encounter (Signed)
Pt stated he has a boil on his buttock area, been soaking in hot water, but is in a lot of pain. Wanting md to call in medication he rx before...Tony Savage

## 2013-08-24 NOTE — Telephone Encounter (Signed)
Ok, but needs OV or UC or ER if worsens pain, fever or swelling as it may need to be drained

## 2013-09-09 ENCOUNTER — Other Ambulatory Visit: Payer: Self-pay | Admitting: Internal Medicine

## 2013-11-11 ENCOUNTER — Other Ambulatory Visit: Payer: Self-pay

## 2014-01-31 ENCOUNTER — Other Ambulatory Visit: Payer: Self-pay | Admitting: Internal Medicine

## 2014-02-15 ENCOUNTER — Other Ambulatory Visit: Payer: Self-pay | Admitting: Internal Medicine

## 2014-03-10 ENCOUNTER — Encounter: Payer: Self-pay | Admitting: Pulmonary Disease

## 2014-03-10 ENCOUNTER — Ambulatory Visit (INDEPENDENT_AMBULATORY_CARE_PROVIDER_SITE_OTHER): Payer: 59 | Admitting: Pulmonary Disease

## 2014-03-10 VITALS — BP 126/70 | HR 65 | Temp 97.0°F | Ht 74.0 in | Wt 320.4 lb

## 2014-03-10 DIAGNOSIS — G4733 Obstructive sleep apnea (adult) (pediatric): Secondary | ICD-10-CM

## 2014-03-10 NOTE — Progress Notes (Signed)
Subjective:    Patient ID: Tony Savage, male    DOB: 11/18/1955, 59 y.o.   MRN: [id number]  HPI The patient is a 59 year old male who comes in today to reestablish for management of obstructive sleep apnea. The patient was diagnosed in 2011 with severe OSA, with an AHI of 79 events per hour. I saw him in 2012 and started the patient on C Pap at a moderate pressure level, and he never followed up with me. He is been wearing the device compliantly by his history, and uses a full face mask.  He has kept up with his mask changes, and also uses a heated humidifier. He believes that his device is currently set on automatic at 5-15 centimeters of water. His wife has told him that he is having breakthrough snoring, and the patient is having nonrestorative sleep. He also notes sleepiness during the day that was not present before, and his Epworth score today is 13. The patient's weight is up 18 pounds from the last visit.   Sleep Questionnaire What time do you typically go to bed?( Between what hours) 9:30p 9:30p at 1556 on 03/10/14 by Inge Rise, CMA How long does it take you to fall asleep? 30 min 30 min at 1556 on 03/10/14 by Inge Rise, CMA How many times during the night do you wake up? 4 4 at 1556 on 03/10/14 by Inge Rise, CMA What time do you get out of bed to start your day? 1884 1660 at 1556 on 03/10/14 by Inge Rise, CMA Do you drive or operate heavy machinery in your occupation? No No at 1556 on 03/10/14 by Inge Rise, CMA How much has your weight changed (up or down) over the past two years? (In pounds) 50 lb (22.68 kg) 50 lb (22.68 kg) at 1556 on 03/10/14 by Inge Rise, CMA Have you ever had a sleep study before? Yes Yes at 1556 on 03/10/14 by Inge Rise, CMA If yes, [facility] at 1556 on 03/10/14 by Inge Rise, CMA If yes, date of study? 07/2009 07/2009 at 1556 on 03/10/14 by Inge Rise, CMA Do you currently use CPAP?  Yes Yes at 1556 on 03/10/14 by Inge Rise, CMA If so, what pressure? Do you wear oxygen at any time? No No at 1556 on 03/10/14 by Inge Rise, CMA   Review of Systems  Constitutional: Negative for fever and unexpected weight change.  HENT: Negative for congestion, dental problem, ear pain, nosebleeds, postnasal drip, rhinorrhea, sinus pressure, sneezing, sore throat and trouble swallowing.   Eyes: Negative for redness and itching.  Respiratory: Negative for cough, chest tightness, shortness of breath and wheezing.   Cardiovascular: Negative for palpitations and leg swelling.  Gastrointestinal: Negative for nausea and vomiting.  Genitourinary: Negative for dysuria.  Musculoskeletal: Negative for joint swelling.  Skin: Negative for rash.  Neurological: Negative for headaches.  Hematological: Does not bruise/bleed easily.  Psychiatric/Behavioral: Negative for dysphoric mood. The patient is not nervous/anxious.        Objective:   Physical Exam Constitutional:  Obese male, no acute distress  HENT:  Nares patent without discharge, enlarged turbinates  Oropharynx without exudate, palate and uvula are thick and elongated  Eyes:  Perrla, eomi, no scleral icterus  Neck:  No JVD, no TMG  Cardiovascular:  Normal rate, regular rhythm, no rubs or gallops.  No murmurs        Intact distal pulses  Pulmonary :  Normal breath sounds, no stridor or respiratory distress   No rales, rhonchi, or wheezing  Abdominal:  Soft, nondistended, bowel sounds present.  No tenderness noted.   Musculoskeletal:  mild lower extremity edema noted.  Lymph Nodes:  No cervical lymphadenopathy noted  Skin:  No cyanosis noted  Neurologic:  Alert, appropriate, moves all 4 extremities without obvious deficit.         Assessment & Plan:

## 2014-03-10 NOTE — Assessment & Plan Note (Signed)
The patient has a history of very severe obstructive sleep apnea, but unfortunately has not been compliant with his follow-up visits. He has stayed on his C Pap device religiously, but is now having breakthrough snoring and sleepiness during the day. Apparently, his machine is set on an automatic level, but he tells me that it is limited at 15 cm. I would like to go ahead and expand the range to 20 cm in light of persistent symptoms. If he is doing very well with his device, I would like to see him back in one year. If he is continuing to have breakthrough snoring and sleepiness, he is to call me to discuss. Finally, I've encouraged him to work aggressively on weight loss.

## 2014-03-10 NOTE — Patient Instructions (Signed)
Will have your machine set on auto 5-20cm Keep up with mask cushion changes and supplies Work on weight loss If you are doing well with new settings, see me back on one year.  If not doing well, I need to hear from you.

## 2014-07-27 ENCOUNTER — Encounter: Payer: Self-pay | Admitting: Internal Medicine

## 2014-07-27 ENCOUNTER — Other Ambulatory Visit (INDEPENDENT_AMBULATORY_CARE_PROVIDER_SITE_OTHER): Payer: 59

## 2014-07-27 ENCOUNTER — Encounter: Payer: Self-pay | Admitting: Physician Assistant

## 2014-07-27 ENCOUNTER — Ambulatory Visit (INDEPENDENT_AMBULATORY_CARE_PROVIDER_SITE_OTHER): Payer: 59 | Admitting: Internal Medicine

## 2014-07-27 VITALS — BP 118/76 | HR 61 | Temp 98.0°F | Ht 74.0 in | Wt 317.0 lb

## 2014-07-27 DIAGNOSIS — R1013 Epigastric pain: Secondary | ICD-10-CM

## 2014-07-27 DIAGNOSIS — Z0000 Encounter for general adult medical examination without abnormal findings: Secondary | ICD-10-CM | POA: Diagnosis not present

## 2014-07-27 DIAGNOSIS — M79604 Pain in right leg: Secondary | ICD-10-CM | POA: Diagnosis not present

## 2014-07-27 DIAGNOSIS — R7302 Impaired glucose tolerance (oral): Secondary | ICD-10-CM | POA: Diagnosis not present

## 2014-07-27 DIAGNOSIS — M545 Low back pain, unspecified: Secondary | ICD-10-CM

## 2014-07-27 LAB — URINALYSIS, ROUTINE W REFLEX MICROSCOPIC
Bilirubin Urine: NEGATIVE
Hgb urine dipstick: NEGATIVE
KETONES UR: NEGATIVE
Leukocytes, UA: NEGATIVE
Nitrite: NEGATIVE
RBC / HPF: NONE SEEN (ref 0–?)
SPECIFIC GRAVITY, URINE: 1.025 (ref 1.000–1.030)
Total Protein, Urine: NEGATIVE
Urine Glucose: NEGATIVE
Urobilinogen, UA: 1 (ref 0.0–1.0)
WBC UA: NONE SEEN (ref 0–?)
pH: 6 (ref 5.0–8.0)

## 2014-07-27 LAB — CBC WITH DIFFERENTIAL/PLATELET
Basophils Absolute: 0 10*3/uL (ref 0.0–0.1)
Basophils Relative: 0.6 % (ref 0.0–3.0)
Eosinophils Absolute: 0.2 10*3/uL (ref 0.0–0.7)
Eosinophils Relative: 2.3 % (ref 0.0–5.0)
HCT: 36.8 % — ABNORMAL LOW (ref 39.0–52.0)
Hemoglobin: 12.6 g/dL — ABNORMAL LOW (ref 13.0–17.0)
LYMPHS PCT: 28.9 % (ref 12.0–46.0)
Lymphs Abs: 2.2 10*3/uL (ref 0.7–4.0)
MCHC: 34.2 g/dL (ref 30.0–36.0)
MCV: 97.2 fl (ref 78.0–100.0)
Monocytes Absolute: 0.7 10*3/uL (ref 0.1–1.0)
Monocytes Relative: 8.7 % (ref 3.0–12.0)
NEUTROS ABS: 4.5 10*3/uL (ref 1.4–7.7)
Neutrophils Relative %: 59.5 % (ref 43.0–77.0)
Platelets: 219 10*3/uL (ref 150.0–400.0)
RBC: 3.78 Mil/uL — ABNORMAL LOW (ref 4.22–5.81)
RDW: 13.2 % (ref 11.5–15.5)
WBC: 7.5 10*3/uL (ref 4.0–10.5)

## 2014-07-27 LAB — HEPATIC FUNCTION PANEL
ALBUMIN: 4.3 g/dL (ref 3.5–5.2)
ALT: 29 U/L (ref 0–53)
AST: 26 U/L (ref 0–37)
Alkaline Phosphatase: 68 U/L (ref 39–117)
Bilirubin, Direct: 0.2 mg/dL (ref 0.0–0.3)
TOTAL PROTEIN: 6.6 g/dL (ref 6.0–8.3)
Total Bilirubin: 0.9 mg/dL (ref 0.2–1.2)

## 2014-07-27 LAB — LIPID PANEL
Cholesterol: 169 mg/dL (ref 0–200)
HDL: 41.7 mg/dL (ref >39.00)
LDL Cholesterol: 92 mg/dL (ref 0–99)
NonHDL: 127.3
Total CHOL/HDL Ratio: 4
Triglycerides: 175 mg/dL — ABNORMAL HIGH (ref 0.0–149.0)
VLDL: 35 mg/dL (ref 0.0–40.0)

## 2014-07-27 LAB — TSH: TSH: 2.07 u[IU]/mL (ref 0.35–4.50)

## 2014-07-27 LAB — BASIC METABOLIC PANEL
BUN: 19 mg/dL (ref 6–23)
CALCIUM: 9.6 mg/dL (ref 8.4–10.5)
CO2: 30 mEq/L (ref 19–32)
CREATININE: 1.21 mg/dL (ref 0.40–1.50)
Chloride: 102 mEq/L (ref 96–112)
GFR: 79.44 mL/min (ref >60.00)
Glucose, Bld: 85 mg/dL (ref 70–99)
Potassium: 4 mEq/L (ref 3.5–5.1)
Sodium: 139 mEq/L (ref 135–145)

## 2014-07-27 LAB — HEMOGLOBIN A1C: HEMOGLOBIN A1C: 5.1 % (ref 4.6–6.5)

## 2014-07-27 LAB — PSA: PSA: 1.55 ng/mL (ref 0.10–4.00)

## 2014-07-27 MED ORDER — TRAMADOL HCL 50 MG PO TABS: 50.0000 mg | ORAL_TABLET | Freq: Three times a day (TID) | ORAL | Status: DC | PRN

## 2014-07-27 MED ORDER — LISINOPRIL-HYDROCHLOROTHIAZIDE 20-12.5 MG PO TABS: 2.0000 | ORAL_TABLET | Freq: Every day | ORAL | Status: DC

## 2014-07-27 MED ORDER — FENOFIBRATE 160 MG PO TABS: 160.0000 mg | ORAL_TABLET | Freq: Every day | ORAL | Status: DC

## 2014-07-27 NOTE — Assessment & Plan Note (Signed)
With ambulation, nonneurogenic per NS, cant r/o vascular - for LE art duplex

## 2014-07-27 NOTE — Progress Notes (Signed)
Pre visit review using our clinic review tool, if applicable. No additional management support is needed unless otherwise documented below in the visit note. 

## 2014-07-27 NOTE — Assessment & Plan Note (Signed)

## 2014-07-27 NOTE — Progress Notes (Signed)
Subjective:    Patient ID: Tony Savage, male    DOB: 12-19-1955, 59 y.o.   MRN: [id number]  HPI   Here for wellness and f/u;  Overall doing ok;  Pt denies Chest pain, worsening SOB, DOE, wheezing, orthopnea, PND, worsening LE edema, palpitations, dizziness or syncope.  Pt denies neurological change such as new headache, facial or extremity weakness.  Pt denies polydipsia, polyuria, or low sugar symptoms. Pt states overall good compliance with treatment and medications, good tolerability, and has been trying to follow appropriate diet.  Pt denies worsening depressive symptoms, suicidal ideation or panic. No fever, night sweats, wt loss, loss of appetite, or other constitutional symptoms.  Pt states good ability with ADL's, has low fall risk, home safety reviewed and adequate, no other significant changes in hearing or vision, and only occasionally active with exercise, as he has predictable right lower back and right leg "going to sleep" and pain with ambulation, seems to start with the calf, better to sit, overall mild to mod, but really limits his ambulation, can only walk < 1/2 mile., Dr Hirsch/NS states he does not believe it to be neurogenic amenable to surgury, suggested pain mangement, pt declines but has been using alleve prn, now with worsening epigastric pains as well.. Retiring soon from [employer] after 30 yrs, but plans to return part time.   Also has occas spells of throat throbbing pain and spasms with eating sometimes, worse in the past 1-2 mo, but o/w no worsening reflux, dysphagia, n/v, bowel change or blood. Thinks may be some improved with taking his wife 20 mg prilosec on top of his own 40 mg protonix.  Was especially worse one time at party where a swallow of ETOH caused severe pain . , but as with all spells can seem to breathe and swallow ok.   Wondering if needs egd - 1998 last one.   Wt Readings from Last 3 Encounters:  07/27/14 317 lb (143.79 kg)  03/10/14 320 lb 6.4  oz (145.332 kg)  06/16/13 313 lb 6 oz (142.146 kg)    Past Medical History  Diagnosis Date  . GLUCOSE INTOLERANCE 12/31/2006  . HYPERLIPIDEMIA 09/29/2006  . 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 OBESITY 09/29/2006  . ERECTILE DYSFUNCTION 12/31/2006  . HYPERTENSION 09/29/2006  . HEMORRHOIDS 09/29/2006  . GERD 09/29/2006  . ABSCESS, GLUTEAL 06/14/2009  . KNEE PAIN, LEFT 10/18/2008  . LOW BACK PAIN 12/31/2006  . SYNCOPE, VASOVAGAL 03/14/2008  . HYPERSOMNIA 04/30/2009  . FEVER UNSPECIFIED 06/08/2007  . Impaired fasting glucose 01/01/2007  . VERTEBRAL FRACTURE 09/29/2006  . BACK PAIN 01/16/2010  . Diverticulosis   . Anal fissure   . Sleep apnea    Past Surgical History  Procedure Laterality Date  . Hemorrhoid surgery    . Fistula-in-ano repair    . Drainage of recurrent perirectal abceses      reports that he quit smoking about 17 years ago. He has never used smokeless tobacco. He reports that he drinks alcohol. He reports that he does not use illicit drugs. family history includes Allergies in his sister; Colon polyps in his brother; Diabetes in his mother; Heart disease in his father; Prostate cancer in his father. No Known Allergies Current Outpatient Prescriptions on File Prior to Visit  Medication Sig Dispense Refill  . aspirin 81 MG EC tablet Take 81 mg by mouth daily.      [name] Kitchen CIALIS 20 MG tablet TAKE 1 TABLET BY MOUTH 5 tablet 0  .  pantoprazole (PROTONIX) 40 MG tablet TAKE 1 TABLET (40 MG TOTAL) BY MOUTH DAILY. 90 tablet 3  . doxycycline (VIBRA-TABS) 100 MG tablet Take 1 tablet (100 mg total) by mouth 2 (two) times daily. (Patient not taking: Reported on 07/27/2014) 20 tablet 0   No current facility-administered medications on file prior to visit.   Review of Systems Constitutional: Negative for increased diaphoresis, other activity, appetite or siginficant weight change other than noted HENT: Negative for worsening hearing loss, ear pain, facial swelling, mouth sores and neck stiffness.     Eyes: Negative for other worsening pain, redness or visual disturbance.  Respiratory: Negative for shortness of breath and wheezing  Cardiovascular: Negative for chest pain and palpitations.  Gastrointestinal: Negative for diarrhea, blood in stool, abdominal distention or other pain Genitourinary: Negative for hematuria, flank pain or change in urine volume.  Musculoskeletal: Negative for myalgias or other joint complaints.  Skin: Negative for color change and wound or drainage.  Neurological: Negative for syncope and numbness. other than noted Hematological: Negative for adenopathy. or other swelling Psychiatric/Behavioral: Negative for hallucinations, SI, self-injury, decreased concentration or other worsening agitation.      Objective:   Physical Exam BP 118/76 mmHg  Pulse 61  Temp(Src) 98 F (36.7 C) (Oral)  Ht 6\' 2"  (1.88 m)  Wt 317 lb (143.79 kg)  BMI 40.68 kg/m2  SpO2 97% VS noted,  Constitutional: Pt is oriented to person, place, and time. Appears well-developed and well-nourished, in no significant distress Head: Normocephalic and atraumatic.  Right Ear: External ear normal.  Left Ear: External ear normal.  Nose: Nose normal.  Mouth/Throat: Oropharynx is clear and moist.  Eyes: Conjunctivae and EOM are normal. Pupils are equal, round, and reactive to light.  Neck: Normal range of motion. Neck supple. No JVD present. No tracheal deviation present or significant neck LA or mass Cardiovascular: Normal rate, regular rhythm, normal heart sounds and intact distal pulses.   Pulmonary/Chest: Effort normal and breath sounds without rales or wheezing  Abdominal: Soft. Bowel sounds are normal. NT. No HSM  Musculoskeletal: Normal range of motion. Exhibits no edema.  Lymphadenopathy:  Has no cervical adenopathy.  Neurological: Pt is alert and oriented to person, place, and time. Pt has normal reflexes. No cranial nerve deficit. Motor grossly intact Skin: Skin is warm and dry. No  rash noted.  Psychiatric:  Has normal mood and affect. Behavior is normal.     Assessment & Plan:

## 2014-07-27 NOTE — Assessment & Plan Note (Signed)
Chronic liekly related to lumbar spondylosis, for tramadol prn, d/c nsaid

## 2014-07-27 NOTE — Assessment & Plan Note (Signed)
stable overall by history and exam, recent data reviewed with pt, and pt to continue medical treatment as before,  to f/u any worsening symptoms or concerns Lab Results  Component Value Date   HGBA1C 5.2 06/10/2013

## 2014-07-27 NOTE — Patient Instructions (Signed)
Please take all new medication as prescribed - the pain medication (tramadol)  Please continue all other medications as before, and refills have been done if requested.  Please have the pharmacy call with any other refills you may need.  Please continue your efforts at being more active, low cholesterol diet, and weight control.  You are otherwise up to date with prevention measures today.  Please keep your appointments with your specialists as you may have planned  You will be contacted regarding the referral for: Leg circulation testing, and Dr [name] Plan referral  Please go to the LAB in the Basement (turn left off the elevator) for the tests to be done today  You will be contacted by phone if any changes need to be made immediately.  Otherwise, you will receive a letter about your results with an explanation, but please check with MyChart first.  Please remember to sign up for MyChart if you have not done so, as this will be important to you in the future with finding out test results, communicating by private email, and scheduling acute appointments online when needed.  Please return in 6 months, or sooner if needed

## 2014-07-27 NOTE — Assessment & Plan Note (Signed)
?   Gastritis vs other, for d/c nsaid, cont ppi, consdier add zantac, check h pylori lab, refer Dr Justin Mend - ? Need EGD

## 2014-07-28 LAB — H. PYLORI ANTIBODY, IGG: H Pylori IgG: NEGATIVE

## 2014-08-01 ENCOUNTER — Other Ambulatory Visit: Payer: Self-pay | Admitting: Internal Medicine

## 2014-08-01 DIAGNOSIS — I739 Peripheral vascular disease, unspecified: Secondary | ICD-10-CM

## 2014-08-01 DIAGNOSIS — M79604 Pain in right leg: Secondary | ICD-10-CM

## 2014-08-04 ENCOUNTER — Other Ambulatory Visit: Payer: Self-pay | Admitting: Internal Medicine

## 2014-08-04 ENCOUNTER — Ambulatory Visit (HOSPITAL_COMMUNITY): Payer: Commercial Managed Care - HMO

## 2014-08-04 ENCOUNTER — Ambulatory Visit (HOSPITAL_COMMUNITY): Payer: Commercial Managed Care - HMO | Attending: Internal Medicine

## 2014-08-04 DIAGNOSIS — M79604 Pain in right leg: Secondary | ICD-10-CM | POA: Diagnosis not present

## 2014-08-04 DIAGNOSIS — I739 Peripheral vascular disease, unspecified: Secondary | ICD-10-CM | POA: Insufficient documentation

## 2014-08-18 ENCOUNTER — Other Ambulatory Visit: Payer: Self-pay | Admitting: Internal Medicine

## 2014-08-18 DIAGNOSIS — I739 Peripheral vascular disease, unspecified: Secondary | ICD-10-CM

## 2014-08-21 ENCOUNTER — Ambulatory Visit (INDEPENDENT_AMBULATORY_CARE_PROVIDER_SITE_OTHER): Payer: Commercial Managed Care - HMO | Admitting: Physician Assistant

## 2014-08-21 ENCOUNTER — Encounter: Payer: Self-pay | Admitting: Physician Assistant

## 2014-08-21 VITALS — BP 126/70 | HR 68 | Ht 70.08 in | Wt 318.0 lb

## 2014-08-21 DIAGNOSIS — R140 Abdominal distension (gaseous): Secondary | ICD-10-CM

## 2014-08-21 DIAGNOSIS — R12 Heartburn: Secondary | ICD-10-CM | POA: Diagnosis not present

## 2014-08-21 DIAGNOSIS — K219 Gastro-esophageal reflux disease without esophagitis: Secondary | ICD-10-CM

## 2014-08-21 MED ORDER — PANTOPRAZOLE SODIUM 40 MG PO TBEC: 40.0000 mg | DELAYED_RELEASE_TABLET | Freq: Every day | ORAL | Status: DC

## 2014-08-21 MED ORDER — FAMOTIDINE 40 MG PO TABS: ORAL_TABLET | ORAL | Status: DC

## 2014-08-21 NOTE — Progress Notes (Signed)
Reviewed and agree with management plan.  Aquila Delaughter T. Carollynn Pennywell, MD FACG 

## 2014-08-21 NOTE — Progress Notes (Signed)
Patient ID: Tony Savage, male   DOB: Oct 01, 1955, 59 y.o.   MRN: [id number]   Subjective:    Patient ID: Tony Savage, male    DOB: 16-Mar-1955, 59 y.o.   MRN: [id number]  HPI Tony Savage is a pleasant 59 year old African-American male known to Dr. [name] Plan from prior colonoscopy. He is referred today by Dr. Jenny Reichmann for evaluation of new epigastric pain. Patient had colonoscopy in 2013 was noted to have a smooth sigmoid stenosis moderate diverticulosis and one polyp was removed which was a tubular adenoma. 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 Patient has had long-term problems with acid reflux and has been on proton X 40 mg by mouth daily. He says he been having trouble over the past 3 months with symptoms which she says are primarily "in my throat". He says he has had an increase in indigestion and intermittent spasms "" which feel like they are in his upper esophagus. He denies any difficulty breathing with these episodes or any dysphagia or odynophagia. He says the symptoms seem to be brought on by a heavier or fattier meals and alcohol. He says he had a shot of gin this weekend and immediately regretted it because he developed tightening in his esophagus heartburn indigestion and bloating. He's also had much more frequent nocturnal symptoms waking him from sleep with similar spasmy feeling in his esophagus "like a lump in his throat as well as indigestion. He finds that if he takes an extra proton X this seems to ease the symptoms. He really has not had any abdominal pain no changes in his bowel habits his appetite is been fine and his weight is been stable.  He says he has gained a lot of weight over the past year because he is been having difficulty walking. He just recently had an ultrasound done of his lower extremities and apparently has blockages in both legs and has an appointment in August to see a vascular surgeon.  Review of Systems Pertinent positive and negative review of  systems were noted in the above HPI section.  All other review of systems was otherwise negative.  Outpatient Encounter Prescriptions as of 08/21/2014  Medication Sig  . aspirin 81 MG EC tablet Take 81 mg by mouth daily.    [name] Kitchen CIALIS 20 MG tablet TAKE 1 TABLET BY MOUTH  . clindamycin (CLEOCIN T) 1 % SWAB   . fenofibrate 160 MG tablet Take 1 tablet (160 mg total) by mouth daily.  [name] Kitchen lisinopril-hydrochlorothiazide (PRINZIDE,ZESTORETIC) 20-12.5 MG per tablet Take 2 tablets by mouth daily.  . pantoprazole (PROTONIX) 40 MG tablet Take 1 tablet (40 mg total) by mouth daily.  . traMADol (ULTRAM) 50 MG tablet Take 1 tablet (50 mg total) by mouth every 8 (eight) hours as needed.  . [DISCONTINUED] pantoprazole (PROTONIX) 40 MG tablet TAKE 1 TABLET BY MOUTH EVERY DAY  . famotidine (PEPCID) 40 MG tablet Take 1 tab at bedtime daily.  . [DISCONTINUED] doxycycline (VIBRA-TABS) 100 MG tablet Take 1 tablet (100 mg total) by mouth 2 (two) times daily. (Patient not taking: Reported on 07/27/2014)   No facility-administered encounter medications on file as of 08/21/2014.   No Known Allergies Patient Active Problem List   Diagnosis Date Noted  . Right leg pain 07/27/2014  . Abdominal pain, epigastric 07/27/2014  . Meralgia paresthetica of right side 06/16/2013  . Chronic low back pain 11/01/2011  . Right lumbar radiculopathy 05/06/2011  . Edema 06/14/2010  . Impaired  glucose tolerance 05/02/2010  . Preventative health care 05/02/2010  . Hematochezia 05/02/2010  . Obstructive sleep apnea 05/29/2009  . HYPERSOMNIA 04/30/2009  . 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 ERECTILE DYSFUNCTION 12/31/2006  . LOW BACK PAIN 12/31/2006  . HYPERLIPIDEMIA 09/29/2006  . OBESITY 09/29/2006  . HYPERTENSION 09/29/2006  . HEMORRHOIDS 09/29/2006  . GERD 09/29/2006  . VERTEBRAL FRACTURE 09/29/2006   History   Social History  . Marital Status: Married    Spouse Name: N/A  . Number of Children: 1  . Years of Education:  N/A   Occupational History  . warehouse ordering clerk Unemployed   Social History Main Topics  . Smoking status: Former Smoker    Quit date: 03/19/1997  . Smokeless tobacco: Never Used     Comment: 1 and 1/2 ppd for 15 years, Quit 1999  . Alcohol Use: 0.0 oz/week    0 Standard drinks or equivalent per week     Comment: 3 glasses wine/day  . Drug Use: No  . Sexual Activity: Not on file   Other Topics Concern  . Not on file   Social History Narrative    Tony Savage's family history includes Allergies in his sister; Colon polyps in his brother; Diabetes in his mother; Heart disease in his father; Prostate cancer in his father.      Objective:    Filed Vitals:   08/21/14 0853  BP: 126/70  Pulse: 68    Physical Exam  well-developed older African-American male in no acute distress, pleasant blood pressure 126/70 pulse 68 height 5 foot 10 weight 318, BMI 45.5. HEENT; nontraumatic normocephalic EOMI PERRLA sclera anicteric, Neck ;supple no JVD, Cardiovascular; regular rate and rhythm with S1-S2 no murmur or gallop, Pulmonary; clear bilaterally, Abdomen; obese soft nontender nondistended bowel sounds are active no palpable mass or hepatosplenomegaly, Rectal; exam not done, Extremities; no clubbing cyanosis or edema skin warm and dry, Neuropsych; mood and affect appropriate       Assessment & Plan:   #1 59 yo male with chronic GERD with increase in heatrburn, probable reflux induced Fullness/spasm in  Upper esophagus, and bloating #2 new finding of claudication-workup in progress #3 morbid obesity #4 OSA #5 hx adenomatous polyps- due for follow up 2018 #6HTN  Plan; Continue Protonix 40 mg po qam  Add pepcid 40 mg po qhs  Strict antireflux regimen and elevation of HOB or wedge pillow Schedule for EGD with Dr. Merita Norton discussed in detail with pt and he is agreeable to proceed. Weight gain likely contributing to sxs, this was discussed -his hopeful that is can get his  legs fixed he can resume a walking program.    Alfredia Ferguson PA-C 08/21/2014   Cc: Biagio Borg, MD

## 2014-08-21 NOTE — Patient Instructions (Addendum)
We sent prescriptions to [facility] [address] 1. protonix 40 mg refills 2.  Pepcid 40 mg   We have given you Anitreflux instrucitons.  You have been scheduled for an endoscopy. Please follow written instructions given to you at your visit today. If you use inhalers (even only as needed), please bring them with you on the day of your procedure. Your physician has requested that you go to www.startemmi.com and enter the access code given to you at your visit today. This web site gives a general overview about your procedure. However, you should still follow specific instructions given to you by our office regarding your preparation for the procedure.

## 2014-08-24 ENCOUNTER — Other Ambulatory Visit: Payer: Self-pay | Admitting: Internal Medicine

## 2014-09-06 ENCOUNTER — Telehealth: Payer: Self-pay | Admitting: Internal Medicine

## 2014-09-06 NOTE — Telephone Encounter (Signed)
Patient called to let you know he did cancel his appointment with Dr. [name] Plan because he feels his problem isn't in his throat. He would like to talk with you about this Can you please call him at [phone]

## 2014-09-06 NOTE — Telephone Encounter (Signed)
Pt advised to schedule a follow up appt

## 2014-09-13 ENCOUNTER — Ambulatory Visit: Payer: Commercial Managed Care - HMO | Admitting: Internal Medicine

## 2014-09-14 ENCOUNTER — Telehealth: Payer: Self-pay | Admitting: Internal Medicine

## 2014-09-14 NOTE — Telephone Encounter (Signed)
Pt called in and said that the pantoprazole (PROTONIX) 40 MG tablet [[id number]] is not working, he wants to know if there is anything else he can take or Dr Jenny Reichmann could call in to help him    Best number [phone]

## 2014-09-14 NOTE — Telephone Encounter (Signed)
I see where pepcid has been added per GI recently.  I dont have anything else to offer at this time.  Perhaps the EGD will be helpful. thanks

## 2014-09-14 NOTE — Telephone Encounter (Signed)
Pt advised in detail via VM

## 2014-09-18 ENCOUNTER — Encounter: Payer: Self-pay | Admitting: Vascular Surgery

## 2014-09-19 ENCOUNTER — Encounter: Payer: Commercial Managed Care - HMO | Admitting: Vascular Surgery

## 2014-09-21 ENCOUNTER — Encounter: Payer: Commercial Managed Care - HMO | Admitting: Vascular Surgery

## 2014-09-26 ENCOUNTER — Encounter: Payer: Self-pay | Admitting: Gastroenterology

## 2014-09-29 ENCOUNTER — Encounter (HOSPITAL_COMMUNITY): Payer: Self-pay | Admitting: Family Medicine

## 2014-09-29 ENCOUNTER — Emergency Department (HOSPITAL_COMMUNITY): Payer: Commercial Managed Care - HMO

## 2014-09-29 ENCOUNTER — Telehealth: Payer: Self-pay | Admitting: Internal Medicine

## 2014-09-29 ENCOUNTER — Emergency Department (HOSPITAL_COMMUNITY)
Admission: EM | Admit: 2014-09-29 | Discharge: 2014-09-29 | Disposition: A | Discharge status: Home / Self Care | Payer: Commercial Managed Care - HMO | Attending: Emergency Medicine | Admitting: Emergency Medicine

## 2014-09-29 DIAGNOSIS — Z8669 Personal history of other diseases of the nervous system and sense organs: Secondary | ICD-10-CM | POA: Diagnosis not present

## 2014-09-29 DIAGNOSIS — Z7982 Long term (current) use of aspirin: Secondary | ICD-10-CM | POA: Insufficient documentation

## 2014-09-29 DIAGNOSIS — Z87891 Personal history of nicotine dependence: Secondary | ICD-10-CM | POA: Diagnosis not present

## 2014-09-29 DIAGNOSIS — E785 Hyperlipidemia, unspecified: Secondary | ICD-10-CM | POA: Insufficient documentation

## 2014-09-29 DIAGNOSIS — Z79899 Other long term (current) drug therapy: Secondary | ICD-10-CM | POA: Insufficient documentation

## 2014-09-29 DIAGNOSIS — Z8781 Personal history of (healed) traumatic fracture: Secondary | ICD-10-CM | POA: Diagnosis not present

## 2014-09-29 DIAGNOSIS — Z792 Long term (current) use of antibiotics: Secondary | ICD-10-CM | POA: Diagnosis not present

## 2014-09-29 DIAGNOSIS — N529 Male erectile dysfunction, unspecified: Secondary | ICD-10-CM | POA: Insufficient documentation

## 2014-09-29 DIAGNOSIS — E669 Obesity, unspecified: Secondary | ICD-10-CM | POA: Diagnosis not present

## 2014-09-29 DIAGNOSIS — R079 Chest pain, unspecified: Secondary | ICD-10-CM | POA: Diagnosis present

## 2014-09-29 DIAGNOSIS — K2100 Gastro-esophageal reflux disease with esophagitis, without bleeding: Secondary | ICD-10-CM

## 2014-09-29 DIAGNOSIS — Z8739 Personal history of other diseases of the musculoskeletal system and connective tissue: Secondary | ICD-10-CM | POA: Insufficient documentation

## 2014-09-29 DIAGNOSIS — Z872 Personal history of diseases of the skin and subcutaneous tissue: Secondary | ICD-10-CM | POA: Insufficient documentation

## 2014-09-29 LAB — BASIC METABOLIC PANEL
Anion gap: 9 (ref 5–15)
BUN: 14 mg/dL (ref 6–20)
CHLORIDE: 101 mmol/L (ref 101–111)
CO2: 24 mmol/L (ref 22–32)
CREATININE: 1.32 mg/dL — AB (ref 0.61–1.24)
Calcium: 9.3 mg/dL (ref 8.9–10.3)
GFR calc non Af Amer: 58 mL/min — ABNORMAL LOW (ref >60)
Glucose, Bld: 111 mg/dL — ABNORMAL HIGH (ref 65–99)
POTASSIUM: 3.7 mmol/L (ref 3.5–5.1)
SODIUM: 134 mmol/L — AB (ref 135–145)

## 2014-09-29 LAB — CBC
HCT: 37.8 % — ABNORMAL LOW (ref 39.0–52.0)
Hemoglobin: 12.8 g/dL — ABNORMAL LOW (ref 13.0–17.0)
MCH: 33.3 pg (ref 26.0–34.0)
MCHC: 33.9 g/dL (ref 30.0–36.0)
MCV: 98.4 fL (ref 78.0–100.0)
PLATELETS: 267 10*3/uL (ref 150–400)
RBC: 3.84 MIL/uL — AB (ref 4.22–5.81)
RDW: 12.4 % (ref 11.5–15.5)
WBC: 8.7 10*3/uL (ref 4.0–10.5)

## 2014-09-29 LAB — I-STAT TROPONIN, ED: Troponin i, poc: 0 ng/mL (ref 0.00–0.08)

## 2014-09-29 MED ORDER — SUCRALFATE 1 G PO TABS: 1.0000 g | ORAL_TABLET | Freq: Three times a day (TID) | ORAL | Status: DC

## 2014-09-29 NOTE — Discharge Instructions (Signed)

## 2014-09-29 NOTE — ED Provider Notes (Addendum)
CSN: [id number]     Arrival date & time 09/29/14  1531 History   First MD Initiated Contact with Patient 09/29/14 1616     Chief Complaint  Patient presents with  . Chest Pain     (Consider location/radiation/quality/duration/timing/severity/associated sxs/prior Treatment) Patient is a 59 y.o. male presenting with chest pain. The history is provided by the patient.  Chest Pain Pain location:  Substernal area Pain quality: pressure   Pain radiates to:  Does not radiate Pain radiates to the back: no   Pain severity:  Mild Onset quality:  Gradual Duration:  3 weeks Timing:  Intermittent Progression:  Waxing and waning Chronicity:  Recurrent Context: eating   Context comment:  All started after drinking tequila Relieved by:  Antacids Exacerbated by: Always worse in the morning when he wakes up. Ineffective treatments:  None tried Associated symptoms: no abdominal pain, no back pain, no cough, no dizziness, no fever, no lower extremity edema, no nausea, no palpitations, no shortness of breath, not vomiting and no weakness   Risk factors: diabetes mellitus, high cholesterol and hypertension   Risk factors: no smoking and no surgery     Past Medical History  Diagnosis Date  . GLUCOSE INTOLERANCE 12/31/2006  . HYPERLIPIDEMIA 09/29/2006  . 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 OBESITY 09/29/2006  . ERECTILE DYSFUNCTION 12/31/2006  . HYPERTENSION 09/29/2006  . HEMORRHOIDS 09/29/2006  . GERD 09/29/2006  . ABSCESS, GLUTEAL 06/14/2009  . KNEE PAIN, LEFT 10/18/2008  . LOW BACK PAIN 12/31/2006  . SYNCOPE, VASOVAGAL 03/14/2008  . HYPERSOMNIA 04/30/2009  . FEVER UNSPECIFIED 06/08/2007  . Impaired fasting glucose 01/01/2007  . VERTEBRAL FRACTURE 09/29/2006  . BACK PAIN 01/16/2010  . Diverticulosis   . Anal fissure   . Sleep apnea    Past Surgical History  Procedure Laterality Date  . Hemorrhoid surgery    . Fistula-in-ano repair    . Drainage of recurrent perirectal abceses     Family History   Problem Relation Age of Onset  . Heart disease Father     CHF  . Allergies Sister   . Prostate cancer Father     went from prostate to colon  . Colon polyps Brother   . Diabetes Mother    Social History  Substance Use Topics  . Smoking status: Former Smoker    Quit date: 03/19/1997  . Smokeless tobacco: Never Used     Comment: 1 and 1/2 ppd for 15 years, Quit 1999  . Alcohol Use: 0.0 oz/week    0 Standard drinks or equivalent per week     Comment: 3 glasses wine/day    Review of Systems  Constitutional: Negative for fever.  Respiratory: Negative for cough and shortness of breath.   Cardiovascular: Positive for chest pain. Negative for palpitations.  Gastrointestinal: Negative for nausea, vomiting and abdominal pain.  Musculoskeletal: Negative for back pain.  Neurological: Negative for dizziness and weakness.  All other systems reviewed and are negative.     Allergies  Review of patient's allergies indicates no known allergies.  Home Medications   Prior to Admission medications   Medication Sig Start Date End Date Taking? Authorizing Provider  aspirin 81 MG EC tablet Take 81 mg by mouth daily.      Historical Provider, MD  CIALIS 20 MG tablet TAKE 1 TABLET BY MOUTH 01/31/14   Biagio Borg, MD  clindamycin (CLEOCIN T) 1 % SWAB  07/24/14   Historical Provider, MD  famotidine (PEPCID) 40 MG tablet Take  1 tab at bedtime daily. 08/21/14   Amy S Esterwood, PA-C  fenofibrate 160 MG tablet Take 1 tablet (160 mg total) by mouth daily. 07/27/14   Biagio Borg, MD  lisinopril-hydrochlorothiazide (PRINZIDE,ZESTORETIC) 20-12.5 MG per tablet Take 2 tablets by mouth daily. 07/27/14   Biagio Borg, MD  lisinopril-hydrochlorothiazide (PRINZIDE,ZESTORETIC) 20-12.5 MG per tablet TAKE 2 TABLETS BY MOUTH DAILY. 08/24/14   Biagio Borg, MD  pantoprazole (PROTONIX) 40 MG tablet Take 1 tablet (40 mg total) by mouth daily. 08/21/14   Amy S Esterwood, PA-C  traMADol (ULTRAM) 50 MG tablet Take 1 tablet  (50 mg total) by mouth every 8 (eight) hours as needed. 07/27/14   Biagio Borg, MD   BP 170/65 mmHg  Pulse 68  Temp(Src) 98.5 F (36.9 C) (Oral)  Resp 18  SpO2 98% Physical Exam  Constitutional: He is oriented to person, place, and time. He appears well-developed and well-nourished. No distress.  HENT:  Head: Normocephalic and atraumatic.  Mouth/Throat: Oropharynx is clear and moist.  Eyes: Conjunctivae and EOM are normal. Pupils are equal, round, and reactive to light.  Neck: Normal range of motion. Neck supple.  Cardiovascular: Normal rate, regular rhythm and intact distal pulses.   No murmur heard. Pulmonary/Chest: Effort normal and breath sounds normal. No respiratory distress. He has no wheezes. He has no rales.  Abdominal: Soft. He exhibits no distension. There is no tenderness. There is no rebound and no guarding.  Musculoskeletal: Normal range of motion. He exhibits no edema or tenderness.  Neurological: He is alert and oriented to person, place, and time.  Skin: Skin is warm and dry. No rash noted. No erythema.  Psychiatric: He has a normal mood and affect. His behavior is normal.  Nursing note and vitals reviewed.   ED Course  Procedures (including critical care time) Labs Review Labs Reviewed  BASIC METABOLIC PANEL - Abnormal; Notable for the following:    Sodium 134 (*)    Glucose, Bld 111 (*)    Creatinine, Ser 1.32 (*)    GFR calc non Af Amer 58 (*)    All other components within normal limits  CBC - Abnormal; Notable for the following:    RBC 3.84 (*)    Hemoglobin 12.8 (*)    HCT 37.8 (*)    All other components within normal limits  I-STAT TROPOININ, ED    Imaging Review Dg Chest 2 View  09/29/2014   CLINICAL DATA:  Intermittent chest pressure for 2 weeks, smoker, history hypertension  EXAM: CHEST  2 VIEW  COMPARISON:  None  FINDINGS: Enlargement of cardiac silhouette with vascular congestion.  Mediastinal contours normal.  Accentuated perihilar markings  which could be related to hypoinflation or mild perihilar infiltrate/edema.  No segmental consolidation, pleural effusion or pneumothorax.  Minimal atelectasis at anterior lung bases on lateral view.  No acute osseous findings.  IMPRESSION: Enlargement of cardiac silhouette with vascular congestion.  Mild anterior basilar atelectasis.  Cannot exclude mild perihilar infiltrate/edema.   Electronically Signed   By: Lavonia Dana M.D.   On: 09/29/2014 16:45   I have personally reviewed and evaluated these images and lab results as part of my medical decision-making.   EKG Interpretation   Date/Time:  Friday September 29 2014 15:34:20 EDT Ventricular Rate:  68 PR Interval:  152 QRS Duration: 92 QT Interval:  368 QTC Calculation: 391 R Axis:   9 Text Interpretation:  Normal sinus rhythm Normal ECG No significant change  since last  tracing Confirmed by Maryan Rued  MD, Loree Fee ([id number]) on 09/29/2014  4:15:50 PM      MDM   Final diagnoses:  Gastroesophageal reflux disease with esophagitis    Pt with atypical story for CP that seems to be most likely related to his hiatal hernia which has been intermittent for the last 3 weeks. It's a tightness that he usually develops in the morning when he wakes up and is worse after drinking alcohol. It improves with Maalox, Pepcid and Protonix. He denies any associated symptoms.  He denies any infectious symptoms and no symptoms concerning for a PE.   EKG without acute findings.   CXR showed what the radiologist thought was mild pulmonary congestion however patient has had no shortness of breath has clear lungs and last smoked over 15 years ago. Feel most likely this is related to a poor inspiratory film. CBC, BMP, trop are all within normal limits.  Given the patient's symptoms are all related to GERD and has hiatal hernia. He recently in the last week started Pepcid on top of his Protonix and he takes Maalox when necessary. Encouraged him to continue this for the  next 1 month and to avoid alcohol. He has an appointment with Dr. [name] Plan with [facility] in November for an endoscopy. Encouraged him to call the office for sooner appointment if symptoms symptoms are not improving.     Blanchie Dessert, MD 09/29/14 1723  Blanchie Dessert, MD 09/29/14 1725

## 2014-09-29 NOTE — ED Notes (Signed)
MD at bedside, Radiology to take patient to xray

## 2014-09-29 NOTE — Telephone Encounter (Signed)
[facility]la Day - Client [facility]     Patient Name: Tony Savage Initial Comment caller states he has chest pressure  DOB: 1956/01/23      Nurse Assessment  Nurse: Venetia Maxon, RN, Manuela Schwartz Date/Time (Eastern Time): 09/29/2014 2:01:52 PM  Confirm and document reason for call. If symptomatic, describe symptoms. ---caller states he has chest pressure ( states he took mylanta and reflux med and it is gone). He ate too late last pm . He is to have an EGD Nov 18th   Has the patient traveled out of the country within the last 30 days? ---No   Does the patient require triage? ---Yes   Related visit to physician within the last 2 weeks? ---No   Does the PT have any chronic conditions? (i.e. diabetes, asthma, etc.) ---Yes   List chronic conditions. ---HTN cholesterol arthritis GERD to see a vein specialist He has a blocked artery.     Guidelines     Guideline Title Affirmed Question Affirmed Notes   Chest Pain [1] Intermittent chest pain or "angina" AND [2] increasing in severity or frequency (Exception: pains lasting a few seconds)    Final Disposition User   Go to ED Now Venetia Maxon, RN, Manuela Schwartz     Referrals   GO TO FACILITY UNDECIDED   Disagree/Comply: Leta Baptist

## 2014-09-29 NOTE — ED Notes (Signed)
Pt here for chest pressure for the past couple of days. Sts indigestion and taking meds gives him relief.

## 2014-10-03 ENCOUNTER — Ambulatory Visit: Payer: Commercial Managed Care - HMO | Admitting: Internal Medicine

## 2014-10-24 ENCOUNTER — Telehealth: Payer: Self-pay | Admitting: *Deleted

## 2014-10-24 NOTE — Telephone Encounter (Signed)
Receive call pt states he is needing to increase his pantoprazole to twice a day. He went to ER on 09/29/14 for indigestion. He states when he don't take the second pill in the afternoon he starts feeling that pressure in his chest. They have schedule him for a endoscopy but its not until 2 wks...Tony Savage

## 2014-10-25 MED ORDER — PANTOPRAZOLE SODIUM 40 MG PO TBEC: 40.0000 mg | DELAYED_RELEASE_TABLET | Freq: Two times a day (BID) | ORAL | Status: DC

## 2014-10-25 NOTE — Telephone Encounter (Signed)
Ok for now, but let pt know there can be difficulty with insurance covering this dose, and may not be allowed  [facility] to take bid of what he has for now ; watch for diarrhea  If bid not covered, he should try OTC zantac 150 mg x 1 later in the day as this can often help as well

## 2014-10-25 NOTE — Telephone Encounter (Signed)
Notified pt with md response.../lmb 

## 2014-10-26 ENCOUNTER — Encounter: Payer: Commercial Managed Care - HMO | Admitting: Gastroenterology

## 2014-11-23 ENCOUNTER — Encounter: Payer: Self-pay | Admitting: Vascular Surgery

## 2014-11-28 ENCOUNTER — Ambulatory Visit (INDEPENDENT_AMBULATORY_CARE_PROVIDER_SITE_OTHER): Payer: Commercial Managed Care - HMO | Admitting: Vascular Surgery

## 2014-11-28 ENCOUNTER — Encounter: Payer: Self-pay | Admitting: Vascular Surgery

## 2014-11-28 VITALS — BP 127/70 | HR 61 | Temp 97.1°F | Resp 18 | Ht 69.0 in | Wt 316.0 lb

## 2014-11-28 DIAGNOSIS — I739 Peripheral vascular disease, unspecified: Secondary | ICD-10-CM | POA: Insufficient documentation

## 2014-11-28 NOTE — Addendum Note (Signed)
Addended by: Dorthula Rue L on: 11/28/2014 02:24 PM   Modules accepted: Orders

## 2014-11-28 NOTE — Progress Notes (Signed)
Subjective:     Patient ID: Tony Savage, male   DOB: Feb 07, 1955, 59 y.o.   MRN: [id number]  HPI this 59 year old male was referred for evaluation of bilateral calf claudication symptoms. He has been having calf discomfort for the past few years which has worsened and right is worse than left. He currently is only able to walk about one half block before stopping to rest. He has no history of gangrene nonhealing ulcers or infection. He does not experience rest pain. He has been evaluated by a neurosurgeon who did not think that his symptoms were nerve compression type symptoms according to the patient. He states that the symptoms are affecting his daily living. He retired this week and would like this evaluated now.  Past Medical History  Diagnosis Date  . GLUCOSE INTOLERANCE 12/31/2006  . HYPERLIPIDEMIA 09/29/2006  . 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 OBESITY 09/29/2006  . ERECTILE DYSFUNCTION 12/31/2006  . HYPERTENSION 09/29/2006  . HEMORRHOIDS 09/29/2006  . GERD 09/29/2006  . ABSCESS, GLUTEAL 06/14/2009  . KNEE PAIN, LEFT 10/18/2008  . LOW BACK PAIN 12/31/2006  . SYNCOPE, VASOVAGAL 03/14/2008  . HYPERSOMNIA 04/30/2009  . FEVER UNSPECIFIED 06/08/2007  . Impaired fasting glucose 01/01/2007  . VERTEBRAL FRACTURE 09/29/2006  . BACK PAIN 01/16/2010  . Diverticulosis   . Anal fissure   . Sleep apnea     Social History  Substance Use Topics  . Smoking status: Former Smoker    Quit date: 03/19/1997  . Smokeless tobacco: Never Used     Comment: 1 and 1/2 ppd for 15 years, Quit 1999  . Alcohol Use: 0.0 oz/week    0 Standard drinks or equivalent per week     Comment: 3 glasses wine/day    Family History  Problem Relation Age of Onset  . Heart disease Father     CHF  . Allergies Sister   . Prostate cancer Father     went from prostate to colon  . Colon polyps Brother   . Diabetes Mother     No Known Allergies   Current outpatient prescriptions:  .  aspirin 81 MG EC tablet, Take 81 mg by  mouth daily.  , Disp: , Rfl:  .  clindamycin (CLEOCIN T) 1 % SWAB, Apply 1 application topically daily. , Disp: , Rfl:  .  fenofibrate 160 MG tablet, Take 1 tablet (160 mg total) by mouth daily., Disp: 90 tablet, Rfl: 3 .  lisinopril-hydrochlorothiazide (PRINZIDE,ZESTORETIC) 20-12.5 MG per tablet, Take 2 tablets by mouth daily., Disp: 180 tablet, Rfl: 3 .  lisinopril-hydrochlorothiazide (PRINZIDE,ZESTORETIC) 20-12.5 MG per tablet, TAKE 2 TABLETS BY MOUTH DAILY., Disp: 180 tablet, Rfl: 1 .  pantoprazole (PROTONIX) 40 MG tablet, Take 1 tablet (40 mg total) by mouth 2 (two) times daily., Disp: 60 tablet, Rfl: 1 .  CIALIS 20 MG tablet, TAKE 1 TABLET BY MOUTH (Patient not taking: Reported on 09/29/2014), Disp: 5 tablet, Rfl: 0 .  naphazoline-pheniramine (NAPHCON-A) 0.025-0.3 % ophthalmic solution, Place 1 drop into both eyes daily as needed for irritation., Disp: , Rfl:  .  sucralfate (CARAFATE) 1 G tablet, Take 1 tablet (1 g total) by mouth 4 (four) times daily -  with meals and at bedtime. (Patient not taking: Reported on 11/28/2014), Disp: 60 tablet, Rfl: 1 .  traMADol (ULTRAM) 50 MG tablet, Take 1 tablet (50 mg total) by mouth every 8 (eight) hours as needed. (Patient not taking: Reported on 09/29/2014), Disp: 120 tablet, Rfl: 2  Filed Vitals:  11/28/14 0931  BP: 127/70  Pulse: 61  Temp: 97.1 F (36.2 C)  Resp: 18  Height: 5\' 9"  (1.753 m)  Weight: 316 lb (143.337 kg)  SpO2: 97%    Body mass index is 46.64 kg/(m^2).         Review of Systems denies chest pain but does have mild dyspnea on exertion. Has remote history of tobacco abuse quit smoking 20 years ago. Also has knee pain and long history of obesity. Has chronic back discomfort. Denies any coronary artery disease lateralizing weakness A fascia amaurosis fugax diplopia or syncope. Other systems negative and a complete review of systems    Objective:   Physical Exam BP 127/70 mmHg  Pulse 61  Temp(Src) 97.1 F (36.2 C)  Resp 18   Ht 5\' 9"  (1.753 m)  Wt 316 lb (143.337 kg)  BMI 46.64 kg/m2  SpO2 97%  Gen.-alert and oriented x3 in no apparent distress-obese HEENT normal for age Lungs no rhonchi or wheezing Cardiovascular regular rhythm no murmurs carotid pulses 3+ palpable no bruits audible Abdomen soft nontender no palpable masses-obese  Musculoskeletal free of  major deformities Skin clear -no rashes Neurologic normal Lower extremities 3+ femoral pulses palpable bilaterally. Popliteal and distal pulses are difficult to palpate. Both feet are well-perfused with no evidence of ischemia gangrene or other abnormalities.  Today I reviewed the lower extremity arterial study performed 08/04/2014 at [facility] lab. There is evidence of narrowing in the mid superficial femoral artery on the right and to a lesser degree on the left and significant calcification and blood vessels. ABIs were not done.       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 If symptoms worsen in the interim he can be in touch with [place] for scheduling angiography to assess for possible PTA and stenting of superficial femoral arteries We'll continue to work on exercise program and weight loss

## 2014-12-01 ENCOUNTER — Ambulatory Visit (AMBULATORY_SURGERY_CENTER): Payer: Self-pay | Admitting: *Deleted

## 2014-12-01 VITALS — Ht 69.0 in | Wt 314.0 lb

## 2014-12-01 DIAGNOSIS — K219 Gastro-esophageal reflux disease without esophagitis: Secondary | ICD-10-CM

## 2014-12-01 NOTE — Progress Notes (Signed)
No egg or soy allergy. No anesthesia problems.  No home O2.  No diet meds.  

## 2014-12-12 ENCOUNTER — Telehealth: Payer: Self-pay | Admitting: Gastroenterology

## 2014-12-12 NOTE — Telephone Encounter (Signed)
Pt heard the CRNA tell Dr [name] Plan that b/c of his size and sleep apnea he couldn't give pt propofol.  Instead he needed to use the "other stuff".  Checked last colon report and pt did receive propofol.  Pt continued to express concerns.  Advised to inform CRNA at next planned procedure and RN would enter this note.  Angela/PV

## 2014-12-13 NOTE — Telephone Encounter (Signed)
Tony Savage,  This pt is cleared for anesthetic care at [facility].  I will phone him to allay his concerns.  Thanks.  Osvaldo Angst

## 2014-12-15 ENCOUNTER — Ambulatory Visit (AMBULATORY_SURGERY_CENTER): Payer: Commercial Managed Care - HMO | Admitting: Gastroenterology

## 2014-12-15 ENCOUNTER — Encounter: Payer: Self-pay | Admitting: Gastroenterology

## 2014-12-15 VITALS — BP 117/57 | HR 73 | Temp 97.4°F | Resp 18 | Ht 69.0 in | Wt 314.0 lb

## 2014-12-15 DIAGNOSIS — K219 Gastro-esophageal reflux disease without esophagitis: Secondary | ICD-10-CM

## 2014-12-15 MED ORDER — SODIUM CHLORIDE 0.9 % IV SOLN: 500.0000 mL | INTRAVENOUS | Status: DC

## 2014-12-15 NOTE — Progress Notes (Signed)
Report to PACU, RN, vss, BBS= Clear.  

## 2014-12-15 NOTE — Op Note (Signed)
[place]  [address]. [place], [address]   ENDOSCOPY PROCEDURE REPORT  PATIENT: Tony Savage, Tony Savage  MR#: [id number] BIRTHDATE: 01-21-1956 , 56  yrs. old GENDER: male ENDOSCOPIST: Ladene Artist, MD, [facility] PROCEDURE DATE:  12/15/2014 PROCEDURE:  EGD, diagnostic ASA CLASS:     Class III INDICATIONS:  history of esophageal reflux. MEDICATIONS: Monitored anesthesia care and Propofol 300 mg IV TOPICAL ANESTHETIC: none DESCRIPTION OF PROCEDURE: After the risks benefits and alternatives of the procedure were thoroughly explained, informed consent was obtained.  The LB [id number] endoscope was introduced through the mouth and advanced to the second portion of the duodenum , Without limitations.  The instrument was slowly withdrawn as the mucosa was fully examined.    EXAM: The esophagus and gastroesophageal junction were completely normal in appearance.  The stomach was entered and closely examined. The antrum, body, angularis, and lesser curvature were well visualized, including a retroflexed view of the cardia and fundus.  The stomach wall was normally distensable.  The scope passed easily through the pylorus into the duodenum buld and to the second portion which were normal.  Retroflexed views revealed no abnormalities.     The scope was then withdrawn from the patient and the procedure completed.  COMPLICATIONS: There were no immediate complications.  ENDOSCOPIC IMPRESSION: Normal appearing EGD  RECOMMENDATIONS: 1.  Anti-reflux regimen long term 2.  Continue PPI bid  eSigned:  Ladene Artist, MD, [facility] 12/15/2014 10:11 AM

## 2014-12-15 NOTE — Patient Instructions (Signed)
YOU HAD AN ENDOSCOPIC PROCEDURE TODAY AT [facility]:   Refer to the procedure report that was given to you for any specific questions about what was found during the examination.  If the procedure report does not answer your questions, please call your gastroenterologist to clarify.  If you requested that your care partner not be given the details of your procedure findings, then the procedure report has been included in a sealed envelope for you to review at your convenience later.  YOU SHOULD EXPECT: Some feelings of bloating in the abdomen. Passage of more gas than usual.  Walking can help get rid of the air that was put into your GI tract during the procedure and reduce the bloating. If you had a lower endoscopy (such as a colonoscopy or flexible sigmoidoscopy) you may notice spotting of blood in your stool or on the toilet paper. If you underwent a bowel prep for your procedure, you may not have a normal bowel movement for a few days.  Please Note:  You might notice some irritation and congestion in your nose or some drainage.  This is from the oxygen used during your procedure.  There is no need for concern and it should clear up in a day or so.  SYMPTOMS TO REPORT IMMEDIATELY:   Following upper endoscopy (EGD)  Vomiting of blood or coffee ground material  New chest pain or pain under the shoulder blades  Painful or persistently difficult swallowing  New shortness of breath  Fever of 100F or higher  Black, tarry-looking stools  For urgent or emergent issues, a gastroenterologist can be reached at any hour by calling [phone].   DIET: Your first meal following the procedure should be a small meal and then it is ok to progress to your normal diet. Heavy or fried foods are harder to digest and may make you feel nauseous or bloated.  Likewise, meals heavy in dairy and vegetables can increase bloating.  Drink plenty of fluids but you should avoid alcoholic beverages for  24 hours.  ACTIVITY:  You should plan to take it easy for the rest of today and you should NOT DRIVE or use heavy machinery until tomorrow (because of the sedation medicines used during the test).    FOLLOW UP: Our staff will call the number listed on your records the next business day following your procedure to check on you and address any questions or concerns that you may have regarding the information given to you following your procedure. If we do not reach you, we will leave a message.  However, if you are feeling well and you are not experiencing any problems, there is no need to return our call.  We will assume that you have returned to your regular daily activities without incident.  If any biopsies were taken you will be contacted by phone or by letter within the next 1-3 weeks.  Please call us at [phone] if you have not heard about the biopsies in 3 weeks.    SIGNATURES/CONFIDENTIALITY: You and/or your care partner have signed paperwork which will be entered into your electronic medical record.  These signatures attest to the fact that that the information above on your After Visit Summary has been reviewed and is understood.  Full responsibility of the confidentiality of this discharge information lies with you and/or your care-partner.  Anti reflux regimen given. Do long term.   Continue medication.

## 2014-12-18 ENCOUNTER — Telehealth: Payer: Self-pay | Admitting: *Deleted

## 2014-12-18 NOTE — Telephone Encounter (Signed)
  Follow up Call-  Call back number 12/15/2014  Post procedure Call Back phone  # [phone]  Permission to leave phone message Yes     Patient questions:  Do you have a fever, pain , or abdominal swelling? No. Pain Score  0 *  Have you tolerated food without any problems? Yes.    Have you been able to return to your normal activities? Yes.    Do you have any questions about your discharge instructions: Diet   No. Medications  No. Follow up visit  No.  Do you have questions or concerns about your Care? No.  Actions: * If pain score is 4 or above: No action needed, pain <4.

## 2014-12-19 ENCOUNTER — Other Ambulatory Visit: Payer: Self-pay | Admitting: Internal Medicine

## 2015-01-10 ENCOUNTER — Telehealth: Payer: Self-pay | Admitting: Gastroenterology

## 2015-01-10 MED ORDER — PANTOPRAZOLE SODIUM 40 MG PO TBEC: 40.0000 mg | DELAYED_RELEASE_TABLET | Freq: Two times a day (BID) | ORAL | Status: DC

## 2015-01-10 NOTE — Telephone Encounter (Signed)
Prescription sent to patient's pharmacy and patient notified. Pt verbalized understanding.

## 2015-02-14 ENCOUNTER — Other Ambulatory Visit: Payer: Self-pay | Admitting: Internal Medicine

## 2015-04-06 ENCOUNTER — Ambulatory Visit: Payer: Commercial Managed Care - HMO | Admitting: Internal Medicine

## 2015-04-30 ENCOUNTER — Other Ambulatory Visit: Payer: Self-pay | Admitting: Internal Medicine

## 2015-06-05 ENCOUNTER — Encounter (HOSPITAL_COMMUNITY): Payer: Commercial Managed Care - HMO

## 2015-06-05 ENCOUNTER — Ambulatory Visit: Payer: Commercial Managed Care - HMO | Admitting: Family

## 2015-07-02 ENCOUNTER — Other Ambulatory Visit: Payer: Self-pay

## 2015-07-02 MED ORDER — PANTOPRAZOLE SODIUM 40 MG PO TBEC: 40.0000 mg | DELAYED_RELEASE_TABLET | Freq: Two times a day (BID) | ORAL | Status: DC

## 2015-07-25 ENCOUNTER — Telehealth: Payer: Self-pay

## 2015-07-25 ENCOUNTER — Other Ambulatory Visit (INDEPENDENT_AMBULATORY_CARE_PROVIDER_SITE_OTHER): Payer: Commercial Managed Care - HMO

## 2015-07-25 DIAGNOSIS — Z0000 Encounter for general adult medical examination without abnormal findings: Secondary | ICD-10-CM

## 2015-07-25 DIAGNOSIS — K352 Acute appendicitis with generalized peritonitis, without abscess: Secondary | ICD-10-CM

## 2015-07-25 LAB — HEPATIC FUNCTION PANEL
ALT: 29 U/L (ref 0–53)
AST: 24 U/L (ref 0–37)
Albumin: 4.3 g/dL (ref 3.5–5.2)
Alkaline Phosphatase: 72 U/L (ref 39–117)
BILIRUBIN DIRECT: 0.2 mg/dL (ref 0.0–0.3)
BILIRUBIN TOTAL: 0.8 mg/dL (ref 0.2–1.2)
Total Protein: 6.9 g/dL (ref 6.0–8.3)

## 2015-07-25 LAB — CBC WITH DIFFERENTIAL/PLATELET
BASOS PCT: 0.3 % (ref 0.0–3.0)
Basophils Absolute: 0 10*3/uL (ref 0.0–0.1)
EOS ABS: 0.2 10*3/uL (ref 0.0–0.7)
Eosinophils Relative: 2.5 % (ref 0.0–5.0)
HEMATOCRIT: 37.4 % — AB (ref 39.0–52.0)
HEMOGLOBIN: 12.6 g/dL — AB (ref 13.0–17.0)
LYMPHS PCT: 23.4 % (ref 12.0–46.0)
Lymphs Abs: 1.7 10*3/uL (ref 0.7–4.0)
MCHC: 33.5 g/dL (ref 30.0–36.0)
MCV: 96.4 fl (ref 78.0–100.0)
MONOS PCT: 8.7 % (ref 3.0–12.0)
Monocytes Absolute: 0.6 10*3/uL (ref 0.1–1.0)
NEUTROS ABS: 4.8 10*3/uL (ref 1.4–7.7)
Neutrophils Relative %: 65.1 % (ref 43.0–77.0)
PLATELETS: 240 10*3/uL (ref 150.0–400.0)
RBC: 3.88 Mil/uL — ABNORMAL LOW (ref 4.22–5.81)
RDW: 13.2 % (ref 11.5–15.5)
WBC: 7.3 10*3/uL (ref 4.0–10.5)

## 2015-07-25 LAB — HEMOGLOBIN A1C: HEMOGLOBIN A1C: 5.2 % (ref 4.6–6.5)

## 2015-07-25 LAB — TSH: TSH: 2.37 u[IU]/mL (ref 0.35–4.50)

## 2015-07-25 LAB — BASIC METABOLIC PANEL
BUN: 17 mg/dL (ref 6–23)
CALCIUM: 9.8 mg/dL (ref 8.4–10.5)
CO2: 28 mEq/L (ref 19–32)
CREATININE: 1.16 mg/dL (ref 0.40–1.50)
Chloride: 102 mEq/L (ref 96–112)
GFR: 83.11 mL/min (ref >60.00)
Glucose, Bld: 117 mg/dL — ABNORMAL HIGH (ref 70–99)
Potassium: 4 mEq/L (ref 3.5–5.1)
Sodium: 137 mEq/L (ref 135–145)

## 2015-07-25 LAB — LIPID PANEL
CHOL/HDL RATIO: 5
Cholesterol: 184 mg/dL (ref 0–200)
HDL: 39.9 mg/dL (ref >39.00)
LDL Cholesterol: 118 mg/dL — ABNORMAL HIGH (ref 0–99)
NONHDL: 144.22
Triglycerides: 129 mg/dL (ref 0.0–149.0)
VLDL: 25.8 mg/dL (ref 0.0–40.0)

## 2015-07-25 LAB — PSA: PSA: 1.34 ng/mL (ref 0.10–4.00)

## 2015-07-25 LAB — MICROALBUMIN / CREATININE URINE RATIO
CREATININE, U: 150.7 mg/dL
MICROALB/CREAT RATIO: 0.5 mg/g (ref 0.0–30.0)
Microalb, Ur: 0.7 mg/dL (ref 0.0–1.9)

## 2015-07-25 NOTE — Telephone Encounter (Signed)
Orders for labs have been placed.

## 2015-08-01 ENCOUNTER — Ambulatory Visit (INDEPENDENT_AMBULATORY_CARE_PROVIDER_SITE_OTHER): Payer: Commercial Managed Care - HMO | Admitting: Internal Medicine

## 2015-08-01 ENCOUNTER — Encounter: Payer: Self-pay | Admitting: Internal Medicine

## 2015-08-01 VITALS — BP 138/80 | HR 60 | Temp 98.6°F | Resp 20 | Wt 310.0 lb

## 2015-08-01 DIAGNOSIS — M5416 Radiculopathy, lumbar region: Secondary | ICD-10-CM | POA: Diagnosis not present

## 2015-08-01 DIAGNOSIS — I739 Peripheral vascular disease, unspecified: Secondary | ICD-10-CM

## 2015-08-01 DIAGNOSIS — I10 Essential (primary) hypertension: Secondary | ICD-10-CM | POA: Diagnosis not present

## 2015-08-01 DIAGNOSIS — Z0001 Encounter for general adult medical examination with abnormal findings: Secondary | ICD-10-CM | POA: Diagnosis not present

## 2015-08-01 DIAGNOSIS — R7302 Impaired glucose tolerance (oral): Secondary | ICD-10-CM

## 2015-08-01 DIAGNOSIS — R6889 Other general symptoms and signs: Secondary | ICD-10-CM

## 2015-08-01 DIAGNOSIS — E785 Hyperlipidemia, unspecified: Secondary | ICD-10-CM

## 2015-08-01 DIAGNOSIS — Z1159 Encounter for screening for other viral diseases: Secondary | ICD-10-CM | POA: Diagnosis not present

## 2015-08-01 NOTE — Progress Notes (Signed)
Subjective:    Patient ID: Tony Savage, male    DOB: Oct 09, 1955, 60 y.o.   MRN: [id number]  HPI  Here for wellness and f/u;  Overall doing ok;  Pt denies Chest pain, worsening SOB, DOE, wheezing, orthopnea, PND, worsening LE edema, palpitations, dizziness or syncope.  Pt denies neurological change such as new headache, facial or extremity weakness.  Pt denies polydipsia, polyuria, or low sugar symptoms. Pt states overall good compliance with treatment and medications, good tolerability, and has been trying to follow appropriate diet.  Pt denies worsening depressive symptoms, suicidal ideation or panic. No fever, night sweats, wt loss, loss of appetite, or other constitutional symptoms.  Pt states good ability with ADL's, has low fall risk, home safety reviewed and adequate, no other significant changes in hearing or vision, and only occasionally active with exercise due to right LBP and leg pain.   C/o > 1 mo dull and lancinating type right lower back pain to right foot, moderate, intermittent but persistent now for > 1 mo, Worse to walk, better to sit, but tries to walk anyway up to 1/4 mile before has to sit due to pain. Nothing else makes better or worse.  Also with known hx of PAD, has seen vascular, but this pain is different, can happen at rest, ambulation not required to flare. Sometimes worse with sitting down, better to lie down. Has kjnown lumbar degenerative changes, has seen Dr Hirsch/NS in past years ago  Reflux improved and only takes the PPI once per day. Usually but will take twice per day only on days of drinking wine and salsa in diet.  Has lost several lbs with better diet intenditonally as well.  Wt Readings from Last 3 Encounters:  08/01/15 310 lb (140.615 kg)  12/15/14 314 lb (142.429 kg)  12/01/14 314 lb (142.429 kg)   Past Medical History  Diagnosis Date  . GLUCOSE INTOLERANCE 12/31/2006  . HYPERLIPIDEMIA 09/29/2006  . 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 OBESITY  09/29/2006  . ERECTILE DYSFUNCTION 12/31/2006  . HYPERTENSION 09/29/2006  . HEMORRHOIDS 09/29/2006  . GERD 09/29/2006  . ABSCESS, GLUTEAL 06/14/2009  . KNEE PAIN, LEFT 10/18/2008  . LOW BACK PAIN 12/31/2006  . SYNCOPE, VASOVAGAL 03/14/2008  . HYPERSOMNIA 04/30/2009  . FEVER UNSPECIFIED 06/08/2007  . Impaired fasting glucose 01/01/2007  . VERTEBRAL FRACTURE 09/29/2006  . BACK PAIN 01/16/2010  . Diverticulosis   . Anal fissure   . Sleep apnea     cpap   Past Surgical History  Procedure Laterality Date  . Hemorrhoid surgery    . Fistula-in-ano repair    . Drainage of recurrent perirectal abceses    . Colonoscopy      reports that he quit smoking about 18 years ago. He has never used smokeless tobacco. He reports that he drinks alcohol. He reports that he does not use illicit drugs. family history includes Allergies in his sister; Colon cancer (age of onset: 91) in his father; Colon polyps in his brother; Diabetes in his mother; Heart disease in his father; Prostate cancer in his father. There is no history of Esophageal cancer or Stomach cancer. No Known Allergies Current Outpatient Prescriptions on File Prior to Visit  Medication Sig Dispense Refill  . aspirin 81 MG EC tablet Take 81 mg by mouth daily.      [name] Kitchen CIALIS 20 MG tablet TAKE 1 TABLET EVERY DAY 5 tablet 0  . clindamycin (CLEOCIN T) 1 % SWAB Apply 1 application topically daily.     [name] Kitchen  fenofibrate 160 MG tablet TAKE 1 TABLET (160 MG TOTAL) BY MOUTH DAILY. 90 tablet 2  . lisinopril-hydrochlorothiazide (PRINZIDE,ZESTORETIC) 20-12.5 MG per tablet Take 2 tablets by mouth daily. 180 tablet 3  . lisinopril-hydrochlorothiazide (PRINZIDE,ZESTORETIC) 20-12.5 MG tablet TAKE 2 TABLETS BY MOUTH DAILY. 180 tablet 1  . pantoprazole (PROTONIX) 40 MG tablet Take 1 tablet (40 mg total) by mouth 2 (two) times daily. 60 tablet 0   No current facility-administered medications on file prior to visit.   Review of Systems Constitutional: Negative for increased  diaphoresis, or other activity, appetite or siginficant weight change other than noted HENT: Negative for worsening hearing loss, ear pain, facial swelling, mouth sores and neck stiffness.   Eyes: Negative for other worsening pain, redness or visual disturbance.  Respiratory: Negative for choking or stridor Cardiovascular: Negative for other chest pain and palpitations.  Gastrointestinal: Negative for worsening diarrhea, blood in stool, or abdominal distention Genitourinary: Negative for hematuria, flank pain or change in urine volume.  Musculoskeletal: Negative for myalgias or other joint complaints.  Skin: Negative for other color change and wound or drainage.  Neurological: Negative for syncope and numbness. other than noted Hematological: Negative for adenopathy. or other swelling Psychiatric/Behavioral: Negative for hallucinations, SI, self-injury, decreased concentration or other worsening agitation.      Objective:   Physical Exam BP 138/80 mmHg  Pulse 60  Temp(Src) 98.6 F (37 C) (Oral)  Resp 20  Wt 310 lb (140.615 kg)  SpO2 97% VS noted,  Constitutional: Pt is oriented to person, place, and time. Appears well-developed and well-nourished, in no significant distress Head: Normocephalic and atraumatic  Eyes: Conjunctivae and EOM are normal. Pupils are equal, round, and reactive to light Right Ear: External ear normal.  Left Ear: External ear normal Nose: Nose normal.  Mouth/Throat: Oropharynx is clear and moist  Neck: Normal range of motion. Neck supple. No JVD present. No tracheal deviation present or significant neck LA or mass Cardiovascular: Normal rate, regular rhythm, normal heart sounds and intact distal pulses.   Pulmonary/Chest: Effort normal and breath sounds without rales or wheezing  Abdominal: Soft. Bowel sounds are normal. NT. No HSM  Musculoskeletal: Normal range of motion. Exhibits no edema; spine with diffuse tender in the midline worse at lower levels, no  specific swelling, rash or erythema, has right paravertebral tender as well - ? Muscle spasm Lymphadenopathy: Has no cervical adenopathy.  Neurological: Pt is alert and oriented to person, place, and time. Pt has normal reflexes. No cranial nerve deficit. Motor 4+/5 RLE weakness o/w intact throughout, sens intact to LT to LE's, DTR diminished bilat patellar Skin: Skin is warm and dry. No rash noted or new ulcers Psychiatric:  Has normal mood and affect. Behavior is normal.   Most recent MRI LS Spine: May 2013 IMPRESSION:  1. Advanced degenerative lumbar spondylosis for age, increased since prior study. 2. Bilateral pars defects at L5 with a grade one spondylolisthesis and progressive degenerative disc disease and facet disease at L4-5 and L5-S1. 3. Interval desiccation and retraction of the large disc protrusion at L4-5. 4. Moderate bilateral foraminal stenosis at L5-S1, not significantly changed.  Original Report Authenticated By: P. Kalman Jewels, M.D.    Assessment & Plan:

## 2015-08-01 NOTE — Patient Instructions (Addendum)
Please continue all other medications as before, and refills have been done if requested.  Please have the pharmacy call with any other refills you may need.  Please continue your efforts at being more active, low cholesterol diet, and weight control.  You are otherwise up to date with prevention measures today.  Please keep your appointments with your specialists as you may have planned  You will be contacted regarding the referral for: MRI for the lower back, and referral to Dr Hirsch/NS  Please return in 6 months, or sooner if needed, with Lab testing done 3-5 days before

## 2015-08-01 NOTE — Progress Notes (Signed)
Pre visit review using our clinic review tool, if applicable. No additional management support is needed unless otherwise documented below in the visit note. 

## 2015-08-03 NOTE — Assessment & Plan Note (Addendum)
Recent worsening, has known mod to severe lumbar deg changes, now with worsening symptoms and mild LLE weakness, c/w possible radiculopathy, for LS spine MRI f/u, and pain control, and refer NS  In addition to the time spent performing CPE, I spent an additional 25 minutes face to face,in which greater than 50% of this time was spent in counseling and coordination of care for patient's acute illness as documented.

## 2015-08-03 NOTE — Assessment & Plan Note (Signed)
stable overall by history and exam, recent data reviewed with pt, and pt to continue medical treatment as before,  to f/u any worsening symptoms or concerns BP Readings from Last 3 Encounters:  08/01/15 138/80  12/15/14 117/57  11/28/14 127/70

## 2015-08-03 NOTE — Assessment & Plan Note (Addendum)
stable overall by history and exam, goal ldl < 70, and pt to continue medical treatment as before,  to f/u any worsening symptoms or concerns, for f/u lab, low chol diet

## 2015-08-03 NOTE — Assessment & Plan Note (Signed)
Stable symtpoms, to cont f/u with vascular as planned

## 2015-08-03 NOTE — Assessment & Plan Note (Signed)
stable overall by history and exam, recent data reviewed with pt, and pt to continue medical treatment as before,  to f/u any worsening symptoms or concerns Lab Results  Component Value Date   HGBA1C 5.2 07/25/2015

## 2015-08-03 NOTE — Assessment & Plan Note (Signed)

## 2015-08-04 ENCOUNTER — Other Ambulatory Visit: Payer: Self-pay | Admitting: Internal Medicine

## 2015-08-21 ENCOUNTER — Telehealth: Payer: Self-pay | Admitting: Emergency Medicine

## 2015-08-21 MED ORDER — PANTOPRAZOLE SODIUM 40 MG PO TBEC: 40.0000 mg | DELAYED_RELEASE_TABLET | Freq: Two times a day (BID) | ORAL | 0 refills | Status: DC

## 2015-08-21 NOTE — Telephone Encounter (Signed)
Pt needs a prescription on pantoprazole (PROTONIX) 40 MG tablet. Pharmacy is [facility]- [address] Please follow up thanks.

## 2015-08-21 NOTE — Telephone Encounter (Signed)
Medication refill sent to pharmacy  

## 2015-09-18 ENCOUNTER — Other Ambulatory Visit: Payer: Self-pay | Admitting: Internal Medicine

## 2015-10-18 ENCOUNTER — Other Ambulatory Visit: Payer: Self-pay | Admitting: Internal Medicine

## 2015-11-19 ENCOUNTER — Other Ambulatory Visit: Payer: Self-pay | Admitting: Internal Medicine

## 2015-12-19 ENCOUNTER — Other Ambulatory Visit: Payer: Self-pay | Admitting: Internal Medicine

## 2016-01-16 ENCOUNTER — Other Ambulatory Visit: Payer: Self-pay | Admitting: Internal Medicine

## 2016-01-17 ENCOUNTER — Other Ambulatory Visit: Payer: Self-pay | Admitting: Internal Medicine

## 2016-01-28 HISTORY — PX: COLONOSCOPY: SHX174

## 2016-02-18 ENCOUNTER — Other Ambulatory Visit: Payer: Self-pay | Admitting: Internal Medicine

## 2016-02-27 ENCOUNTER — Encounter: Payer: Self-pay | Admitting: Gastroenterology

## 2016-03-26 DIAGNOSIS — G4733 Obstructive sleep apnea (adult) (pediatric): Secondary | ICD-10-CM | POA: Diagnosis not present

## 2016-04-01 ENCOUNTER — Encounter: Payer: Self-pay | Admitting: Gastroenterology

## 2016-04-01 DIAGNOSIS — L700 Acne vulgaris: Secondary | ICD-10-CM | POA: Diagnosis not present

## 2016-05-23 ENCOUNTER — Encounter: Payer: Self-pay | Admitting: Gastroenterology

## 2016-05-23 ENCOUNTER — Ambulatory Visit (AMBULATORY_SURGERY_CENTER): Payer: Self-pay | Admitting: *Deleted

## 2016-05-23 VITALS — Ht 69.0 in | Wt 301.6 lb

## 2016-05-23 DIAGNOSIS — Z86010 Personal history of colonic polyps: Secondary | ICD-10-CM

## 2016-05-23 MED ORDER — NA SULFATE-K SULFATE-MG SULF 17.5-3.13-1.6 GM/177ML PO SOLN: ORAL | 0 refills | Status: DC

## 2016-05-23 NOTE — Progress Notes (Signed)
No allergies to eggs or soy. No problems with anesthesia.  Pt given Emmi instructions for colonoscopy  No oxygen use  No diet drug use  

## 2016-06-06 ENCOUNTER — Encounter: Payer: Self-pay | Admitting: Gastroenterology

## 2016-06-06 ENCOUNTER — Ambulatory Visit (AMBULATORY_SURGERY_CENTER): Payer: Commercial Managed Care - HMO | Admitting: Gastroenterology

## 2016-06-06 VITALS — BP 132/79 | HR 54 | Temp 97.8°F | Resp 16 | Ht 69.0 in | Wt 301.0 lb

## 2016-06-06 DIAGNOSIS — Z538 Procedure and treatment not carried out for other reasons: Secondary | ICD-10-CM

## 2016-06-06 DIAGNOSIS — Z86010 Personal history of colonic polyps: Secondary | ICD-10-CM | POA: Diagnosis present

## 2016-06-06 DIAGNOSIS — K219 Gastro-esophageal reflux disease without esophagitis: Secondary | ICD-10-CM | POA: Diagnosis not present

## 2016-06-06 MED ORDER — SODIUM CHLORIDE 0.9 % IV SOLN: 500.0000 mL | INTRAVENOUS | Status: DC

## 2016-06-06 NOTE — Patient Instructions (Signed)
YOU HAD AN ENDOSCOPIC PROCEDURE TODAY AT [facility]:   Refer to the procedure report that was given to you for any specific questions about what was found during the examination.  If the procedure report does not answer your questions, please call your gastroenterologist to clarify.  If you requested that your care partner not be given the details of your procedure findings, then the procedure report has been included in a sealed envelope for you to review at your convenience later.  YOU SHOULD EXPECT: Some feelings of bloating in the abdomen. Passage of more gas than usual.  Walking can help get rid of the air that was put into your GI tract during the procedure and reduce the bloating. If you had a lower endoscopy (such as a colonoscopy or flexible sigmoidoscopy) you may notice spotting of blood in your stool or on the toilet paper. If you underwent a bowel prep for your procedure, you may not have a normal bowel movement for a few days.  Please Note:  You might notice some irritation and congestion in your nose or some drainage.  This is from the oxygen used during your procedure.  There is no need for concern and it should clear up in a day or so.  SYMPTOMS TO REPORT IMMEDIATELY:   Following lower endoscopy (colonoscopy or flexible sigmoidoscopy):  Excessive amounts of blood in the stool  Significant tenderness or worsening of abdominal pains  Swelling of the abdomen that is new, acute  Fever of 100F or higher   For urgent or emergent issues, a gastroenterologist can be reached at any hour by calling [phone].   DIET:  We do recommend a small meal at first, but then you may proceed to your regular diet.  Drink plenty of fluids but you should avoid alcoholic beverages for 24 hours.  ACTIVITY:  You should plan to take it easy for the rest of today and you should NOT DRIVE or use heavy machinery until tomorrow (because of the sedation medicines used during the test).     FOLLOW UP: Our staff will call the number listed on your records the next business day following your procedure to check on you and address any questions or concerns that you may have regarding the information given to you following your procedure. If we do not reach you, we will leave a message.  However, if you are feeling well and you are not experiencing any problems, there is no need to return our call.  We will assume that you have returned to your regular daily activities without incident.  If any biopsies were taken you will be contacted by phone or by letter within the next 1-3 weeks.  Please call us at [phone] if you have not heard about the biopsies in 3 weeks.    SIGNATURES/CONFIDENTIALITY: You and/or your care partner have signed paperwork which will be entered into your electronic medical record.  These signatures attest to the fact that that the information above on your After Visit Summary has been reviewed and is understood.  Full responsibility of the confidentiality of this discharge information lies with you and/or your care-partner.  Diverticulosis, hemorrhoid information given.  Air contrast Barium Enema in 2 weeks.

## 2016-06-06 NOTE — Progress Notes (Signed)
Report to PACU, RN, vss, BBS= Clear.  

## 2016-06-06 NOTE — Op Note (Signed)
[facility] Patient Name: Tony Savage Procedure Date: 06/06/2016 9:45 AM MRN: [id number] Endoscopist: Ladene Artist , MD Age: 61 Referring MD:  Date of Birth: 01-Nov-1955 Gender: Male Account #: [id number] Procedure:                Colonoscopy Indications:              Surveillance: Personal history of adenomatous                            polyps on last colonoscopy 5 years ago Medicines:                Monitored Anesthesia Care Procedure:                Pre-Anesthesia Assessment:                           - Prior to the procedure, a History and Physical                            was performed, and patient medications and                            allergies were reviewed. The patient's tolerance of                            previous anesthesia was also reviewed. The risks                            and benefits of the procedure and the sedation                            options and risks were discussed with the patient.                            All questions were answered, and informed consent                            was obtained. Prior Anticoagulants: The patient has                            taken no previous anticoagulant or antiplatelet                            agents. ASA Grade Assessment: III - A patient with                            severe systemic disease. After reviewing the risks                            and benefits, the patient was deemed in                            satisfactory condition to undergo the procedure.  After obtaining informed consent, the colonoscope                            was passed under direct vision. Throughout the                            procedure, the patient's blood pressure, pulse, and                            oxygen saturations were monitored continuously. The                            Colonoscope was introduced through the anus with                            the intention of  advancing to the cecum. The scope                            was advanced to the sigmoid colon before the                            procedure was aborted. Medications were given. The                            colonoscopy was technically difficult and complex                            due to bowel stenosis. The patient tolerated the                            procedure well. The quality of the bowel                            preparation was good. The rectum was photographed. Scope In: 9:51:31 AM Scope Out: 10:05:17 AM Total Procedure Duration: 0 hours 13 minutes 46 seconds  Findings:                 The perianal and digital rectal examinations were                            normal.                           A benign-appearing, intrinsic severe stenosis                            measuring of unknown length was found in the                            sigmoid colon and was non-traversed. Despite                            abdominal pressure, patient position changes, I was  unable to traverse the stricture.                           Many medium-mouthed diverticula were found in the                            sigmoid colon. There was narrowing of the colon in                            association with the diverticular opening. There                            was evidence of diverticular spasm. There was no                            evidence of diverticular bleeding.                           Internal hemorrhoids were found during                            retroflexion. The hemorrhoids were small and Grade                            I (internal hemorrhoids that do not prolapse). The                            exam to the sigmoid colon otherwise appeared normal. Complications:            No immediate complications. Estimated Blood Loss:     Estimated blood loss: none. Impression:               - Stricture in the sigmoid colon.                           -  Moderate diverticulosis in the sigmoid colon.                            There was narrowing of the colon in association                            with the diverticular opening. There was evidence                            of diverticular spasm. There was no evidence of                            diverticular bleeding.                           - Internal hemorrhoids.                           - No specimens collected. Recommendation:           - Patient has a contact number available for  emergencies. The signs and symptoms of potential                            delayed complications were discussed with the                            patient. Return to normal activities tomorrow.                            Written discharge instructions were provided to the                            patient.                           - Resume previous diet.                           - Continue present medications.                           - Perform an air contrast barium enema in 2 weeks.                           - No recommendation at this time regarding repeat                            colonoscopy. Ladene Artist, MD 06/06/2016 10:14:00 AM This report has been signed electronically.

## 2016-06-09 ENCOUNTER — Telehealth: Payer: Self-pay

## 2016-06-09 DIAGNOSIS — Z1211 Encounter for screening for malignant neoplasm of colon: Secondary | ICD-10-CM

## 2016-06-09 NOTE — Telephone Encounter (Signed)
Patient has been scheduled for BE on 07/09/16 9:30. I mailed him instructions.

## 2016-06-09 NOTE — Telephone Encounter (Signed)
  Follow up Call-  Call back number 06/06/2016 12/15/2014  Post procedure Call Back phone  # [phone]  Permission to leave phone message Yes Yes  Some recent data might be hidden     Patient questions:  Do you have a fever, pain , or abdominal swelling? No. Pain Score  0 *  Have you tolerated food without any problems? Yes.    Have you been able to return to your normal activities? Yes.    Do you have any questions about your discharge instructions: Diet   No. Medications  No. Follow up visit  No.  Do you have questions or concerns about your Care? No.  Actions: * If pain score is 4 or above: No action needed, pain <4.  No problems noted per pt. maw

## 2016-06-10 NOTE — Telephone Encounter (Signed)
Patient notified of the date and time.  He will call me if he has any questions about the instructions once he receives them in the mail.

## 2016-06-12 ENCOUNTER — Ambulatory Visit (INDEPENDENT_AMBULATORY_CARE_PROVIDER_SITE_OTHER): Payer: Commercial Managed Care - HMO | Admitting: Pulmonary Disease

## 2016-06-12 ENCOUNTER — Encounter: Payer: Self-pay | Admitting: Pulmonary Disease

## 2016-06-12 DIAGNOSIS — G4733 Obstructive sleep apnea (adult) (pediatric): Secondary | ICD-10-CM | POA: Diagnosis not present

## 2016-06-12 NOTE — Patient Instructions (Signed)
  It was a pleasure taking care of you today!  Continue using your CPAP machine.   Please make sure you use your CPAP device everytime you sleep.  We will monitor the usage of your machine per your insurance requirement.  Your insurance company may take the machine from you if you are not using it regularly.   Please clean the mask, tubings, filter, water reservoir with soapy water every week.  Please use distilled water for the water reservoir.   Please call the office or your machine provider (DME company) if you are having issues with the device.   Return to clinic in 1 year   with NP/APP

## 2016-06-12 NOTE — Progress Notes (Signed)
Subjective:    Patient ID: Tony Savage, male    DOB: March 31, 1955, 61 y.o.   MRN: [id number]  HPI  ROV 06/12/2016  Patient is here as f/u on his severe sleep apnea in 2011 with an AHI of 79. He was last seen by Dr. Gwenette Greet in 02/2014. He is currently on auto CPAP and his settings were changed then. He is currently an auto CPAP-20 cm water. He feels better using it. More energy. Less sleepiness. Download the last month: 97%, AHI is less than 1.       Review of Systems  Constitutional: Negative.  Negative for fever and unexpected weight change.  HENT: Negative.  Negative for congestion, dental problem, ear pain, nosebleeds, postnasal drip, rhinorrhea, sinus pressure, sneezing, sore throat and trouble swallowing.   Eyes: Negative.  Negative for redness and itching.  Respiratory: Negative.  Negative for cough, chest tightness, shortness of breath and wheezing.   Cardiovascular: Negative.  Negative for palpitations and leg swelling.  Gastrointestinal: Negative.  Negative for nausea and vomiting.  Endocrine: Negative.   Genitourinary: Negative.  Negative for dysuria.  Musculoskeletal: Negative.  Negative for joint swelling.  Skin: Negative for rash.  Allergic/Immunologic: Negative.  Negative for environmental allergies, food allergies and immunocompromised state.  Neurological: Negative.  Negative for headaches.  Hematological: Bruises/bleeds easily.  Psychiatric/Behavioral: Negative.  Negative for dysphoric mood. The patient is not nervous/anxious.        Objective:   Physical Exam    Vitals:  Vitals:   06/12/16 1418  BP: 104/64  Pulse: (!) 57  SpO2: 98%  Weight: (!) 301 lb (136.5 kg)  Height: 5\' 9"  (1.753 m)    Constitutional/General:  Pleasant, well-nourished, well-developed, not in any distress,  Comfortably seating.  Well kempt  Body mass index is 44.45 kg/m. Wt Readings from Last 3 Encounters:  06/12/16 (!) 301 lb (136.5 kg)  06/06/16 (!) 301 lb (136.5 kg)    05/23/16 (!) 301 lb 9.6 oz (136.8 kg)     HEENT: Pupils equal and reactive to light and accommodation. Anicteric sclerae. Normal nasal mucosa.   No oral  lesions,  mouth clear,  oropharynx clear, no postnasal drip. (-) Oral thrush. No dental caries.  Airway - Mallampati class III  Neck: No masses. Midline trachea. No JVD, (-) LAD. (-) bruits appreciated.  Respiratory/Chest: Grossly normal chest. (-) deformity. (-) Accessory muscle use.  Symmetric expansion. (-) Tenderness on palpation.  Resonant on percussion.  Diminished BS on both lower lung zones. (-) wheezing, crackles, rhonchi (-) egophony  Cardiovascular: Regular rate and  rhythm, heart sounds normal, no murmur or gallops, no peripheral edema  Gastrointestinal:  Normal bowel sounds. Soft, non-tender. No hepatosplenomegaly.  (-) masses.   Musculoskeletal:  Normal muscle tone. Normal gait.   Extremities: Grossly normal. (-) clubbing, cyanosis.  (-) edema  Skin: (-) rash,lesions seen.   Neurological/Psychiatric : alert, oriented to time, place, person. Normal mood and affect        Assessment & Plan:  Obstructive sleep apnea Patient with severe sleep apnea significantly improved on auto CPAP. His machine he got in 2011. He recently brought machine to DME and they checked the machine and it seems to be working fine.  Tolerating CPAP. Feels better using it. More energy. Less sleepiness. Good download and compliance. Feels benefit of cpap.   Plan :  We extensively discussed the importance of treating OSA and the need to use PAP therapy.   Continue with autocpap 10-20 cm water.  Good DL and compliance.  Feels benefit of cpap. Machine since 2011 >> anticipate will need a new machine in 1-2 yrs.    Patient was instructed to have mask, tubings, filter, reservoir cleaned at least once a week with soapy water.  Patient was instructed to call the office if he/she is having issues with the PAP device.    I advised  patient to obtain sufficient amount of sleep --  7 to 8 hours at least in a 24 hr period.  Patient was advised to follow good sleep hygiene.  Patient was advised NOT to engage in activities requiring concentration and/or vigilance if he/she is and  sleepy.  Patient is NOT to drive if he/she is sleepy.        Patient will follow up in 1 year.     Monica Becton, MD 06/12/2016   2:51 PM [facility] Pager: [phone] Office: [phone], Fax: [phone]

## 2016-06-12 NOTE — Assessment & Plan Note (Signed)
Patient with severe sleep apnea significantly improved on auto CPAP. His machine he got in 2011. He recently brought machine to DME and they checked the machine and it seems to be working fine.  Tolerating CPAP. Feels better using it. More energy. Less sleepiness. Good download and compliance. Feels benefit of cpap.   Plan :  We extensively discussed the importance of treating OSA and the need to use PAP therapy.   Continue with autocpap 10-20 cm water. Good DL and compliance.  Feels benefit of cpap. Machine since 2011 >> anticipate will need a new machine in 1-2 yrs.    Patient was instructed to have mask, tubings, filter, reservoir cleaned at least once a week with soapy water.  Patient was instructed to call the office if he/she is having issues with the PAP device.    I advised patient to obtain sufficient amount of sleep --  7 to 8 hours at least in a 24 hr period.  Patient was advised to follow good sleep hygiene.  Patient was advised NOT to engage in activities requiring concentration and/or vigilance if he/she is and  sleepy.  Patient is NOT to drive if he/she is sleepy.

## 2016-06-16 ENCOUNTER — Telehealth: Payer: Self-pay | Admitting: Gastroenterology

## 2016-06-16 NOTE — Telephone Encounter (Signed)
BE cancelled with radiology

## 2016-06-17 ENCOUNTER — Other Ambulatory Visit: Payer: Self-pay | Admitting: Internal Medicine

## 2016-06-26 DIAGNOSIS — G4733 Obstructive sleep apnea (adult) (pediatric): Secondary | ICD-10-CM | POA: Diagnosis not present

## 2016-07-09 ENCOUNTER — Ambulatory Visit (HOSPITAL_COMMUNITY): Payer: Commercial Managed Care - HMO

## 2016-08-01 ENCOUNTER — Other Ambulatory Visit (INDEPENDENT_AMBULATORY_CARE_PROVIDER_SITE_OTHER): Payer: 59

## 2016-08-01 ENCOUNTER — Other Ambulatory Visit: Payer: Self-pay | Admitting: Internal Medicine

## 2016-08-01 ENCOUNTER — Encounter: Payer: Self-pay | Admitting: Internal Medicine

## 2016-08-01 ENCOUNTER — Telehealth: Payer: Self-pay

## 2016-08-01 ENCOUNTER — Ambulatory Visit (INDEPENDENT_AMBULATORY_CARE_PROVIDER_SITE_OTHER): Payer: 59 | Admitting: Internal Medicine

## 2016-08-01 VITALS — BP 122/78 | HR 55 | Temp 97.8°F | Ht 69.0 in | Wt 300.0 lb

## 2016-08-01 DIAGNOSIS — I10 Essential (primary) hypertension: Secondary | ICD-10-CM

## 2016-08-01 DIAGNOSIS — Z114 Encounter for screening for human immunodeficiency virus [HIV]: Secondary | ICD-10-CM

## 2016-08-01 DIAGNOSIS — M5416 Radiculopathy, lumbar region: Secondary | ICD-10-CM

## 2016-08-01 DIAGNOSIS — R7302 Impaired glucose tolerance (oral): Secondary | ICD-10-CM | POA: Diagnosis not present

## 2016-08-01 DIAGNOSIS — Z0001 Encounter for general adult medical examination with abnormal findings: Secondary | ICD-10-CM

## 2016-08-01 DIAGNOSIS — E785 Hyperlipidemia, unspecified: Secondary | ICD-10-CM

## 2016-08-01 DIAGNOSIS — I739 Peripheral vascular disease, unspecified: Secondary | ICD-10-CM | POA: Diagnosis not present

## 2016-08-01 DIAGNOSIS — R001 Bradycardia, unspecified: Secondary | ICD-10-CM | POA: Diagnosis not present

## 2016-08-01 DIAGNOSIS — Z1159 Encounter for screening for other viral diseases: Secondary | ICD-10-CM

## 2016-08-01 LAB — CBC WITH DIFFERENTIAL/PLATELET
Basophils Absolute: 0.1 10*3/uL (ref 0.0–0.1)
Basophils Relative: 0.7 % (ref 0.0–3.0)
EOS PCT: 2.2 % (ref 0.0–5.0)
Eosinophils Absolute: 0.2 10*3/uL (ref 0.0–0.7)
HCT: 35.8 % — ABNORMAL LOW (ref 39.0–52.0)
Hemoglobin: 12.3 g/dL — ABNORMAL LOW (ref 13.0–17.0)
LYMPHS ABS: 2.3 10*3/uL (ref 0.7–4.0)
Lymphocytes Relative: 29.9 % (ref 12.0–46.0)
MCHC: 34.5 g/dL (ref 30.0–36.0)
MCV: 97.5 fl (ref 78.0–100.0)
MONO ABS: 0.6 10*3/uL (ref 0.1–1.0)
Monocytes Relative: 8.4 % (ref 3.0–12.0)
NEUTROS ABS: 4.5 10*3/uL (ref 1.4–7.7)
NEUTROS PCT: 58.8 % (ref 43.0–77.0)
PLATELETS: 198 10*3/uL (ref 150.0–400.0)
RBC: 3.67 Mil/uL — AB (ref 4.22–5.81)
RDW: 13.3 % (ref 11.5–15.5)
WBC: 7.6 10*3/uL (ref 4.0–10.5)

## 2016-08-01 LAB — HIV ANTIBODY (ROUTINE TESTING W REFLEX): HIV: NONREACTIVE

## 2016-08-01 LAB — BASIC METABOLIC PANEL
BUN: 16 mg/dL (ref 6–23)
CO2: 31 meq/L (ref 19–32)
Calcium: 9.9 mg/dL (ref 8.4–10.5)
Chloride: 100 mEq/L (ref 96–112)
Creatinine, Ser: 1.13 mg/dL (ref 0.40–1.50)
GFR: 85.36 mL/min (ref >60.00)
GLUCOSE: 106 mg/dL — AB (ref 70–99)
POTASSIUM: 3.2 meq/L — AB (ref 3.5–5.1)
SODIUM: 142 meq/L (ref 135–145)

## 2016-08-01 LAB — HEMOGLOBIN A1C: HEMOGLOBIN A1C: 4.9 % (ref 4.6–6.5)

## 2016-08-01 LAB — URINALYSIS, ROUTINE W REFLEX MICROSCOPIC
BILIRUBIN URINE: NEGATIVE
HGB URINE DIPSTICK: NEGATIVE
Ketones, ur: NEGATIVE
LEUKOCYTES UA: NEGATIVE
NITRITE: NEGATIVE
RBC / HPF: NONE SEEN (ref 0–?)
Specific Gravity, Urine: 1.01 (ref 1.000–1.030)
TOTAL PROTEIN, URINE-UPE24: NEGATIVE
Urine Glucose: NEGATIVE
Urobilinogen, UA: 1 (ref 0.0–1.0)
WBC, UA: NONE SEEN (ref 0–?)
pH: 6.5 (ref 5.0–8.0)

## 2016-08-01 LAB — HEPATIC FUNCTION PANEL
ALBUMIN: 4.4 g/dL (ref 3.5–5.2)
ALK PHOS: 68 U/L (ref 39–117)
ALT: 20 U/L (ref 0–53)
AST: 23 U/L (ref 0–37)
BILIRUBIN DIRECT: 0.2 mg/dL (ref 0.0–0.3)
Total Bilirubin: 1 mg/dL (ref 0.2–1.2)
Total Protein: 6.8 g/dL (ref 6.0–8.3)

## 2016-08-01 LAB — PSA: PSA: 1.65 ng/mL (ref 0.10–4.00)

## 2016-08-01 LAB — LIPID PANEL
CHOLESTEROL: 178 mg/dL (ref 0–200)
HDL: 48.9 mg/dL (ref >39.00)
LDL CALC: 104 mg/dL — AB (ref 0–99)
NonHDL: 129.24
Total CHOL/HDL Ratio: 4
Triglycerides: 124 mg/dL (ref 0.0–149.0)
VLDL: 24.8 mg/dL (ref 0.0–40.0)

## 2016-08-01 LAB — HEPATITIS C ANTIBODY: HCV Ab: NEGATIVE

## 2016-08-01 LAB — TSH: TSH: 2.4 u[IU]/mL (ref 0.35–4.50)

## 2016-08-01 MED ORDER — GABAPENTIN 100 MG PO CAPS: 100.0000 mg | ORAL_CAPSULE | Freq: Three times a day (TID) | ORAL | 0 refills | Status: DC

## 2016-08-01 MED ORDER — GABAPENTIN 300 MG PO CAPS: 300.0000 mg | ORAL_CAPSULE | Freq: Three times a day (TID) | ORAL | 5 refills | Status: DC

## 2016-08-01 MED ORDER — ROSUVASTATIN CALCIUM 10 MG PO TABS: 10.0000 mg | ORAL_TABLET | Freq: Every day | ORAL | 3 refills | Status: DC

## 2016-08-01 MED ORDER — POTASSIUM CHLORIDE ER 10 MEQ PO CPCR
10.0000 meq | ORAL_CAPSULE | Freq: Every day | ORAL | 3 refills | Status: DC
Start: 2016-08-01 — End: 2017-09-07

## 2016-08-01 NOTE — Telephone Encounter (Signed)
Pt has been informed and expressed understanding.  

## 2016-08-01 NOTE — Telephone Encounter (Signed)
-----   Message from Biagio Borg, MD sent at 08/01/2016  4:37 PM EDT ----- Left message on MyChart, pt to cont same tx except  The test results show that your current treatment is OK, except the LDL cholesterol is too high, given your history.   Please start the crestor as discussed at your visit.  Also, the potassium was mildly low, and most likely related to the fluid pill part of your medications.  We should add a potassium pill - 1 per day to correct this.   Anesha Hackert to please inform pt, I will do rx

## 2016-08-01 NOTE — Progress Notes (Signed)
Subjective:    Patient ID: Tony Savage, male    DOB: 12/16/1955, 61 y.o.   MRN: [id number]  HPI  Here for wellness and f/u;  Overall doing ok;  Pt denies Chest pain, worsening SOB, DOE, wheezing, orthopnea, PND, worsening LE edema, palpitations, or syncope but has had occasional dizziness..  Pt denies neurological change such as new headache, facial or extremity weakness.  Pt denies polydipsia, polyuria, or low sugar symptoms. Pt states overall good compliance with treatment and medications, good tolerability, and has been trying to follow appropriate diet.  Pt denies worsening depressive symptoms, suicidal ideation or panic. No fever, night sweats, wt loss, loss of appetite, or other constitutional symptoms.  Pt states good ability with ADL's, has low fall risk, home safety reviewed and adequate, no other significant changes in hearing or vision, and only occasionally active with exercise. Is S/p suboptimal colonoscopy, and asked to Barium Enema but did not do this since had bad experience before per pt.  BP Readings from Last 3 Encounters:  08/01/16 122/78  06/12/16 104/64  06/06/16 132/79   Wt Readings from Last 3 Encounters:  08/01/16 300 lb (136.1 kg)  06/12/16 (!) 301 lb (136.5 kg)  06/06/16 (!) 301 lb (136.5 kg)  Not walking much anymore and hard to get wt off due to back and leg pain.  Now retired due to pain and numbness - Still with significant RLE pain and numbness recurring, never did the MRI and see surgury/Dr Luiz Ochoa. as he thought they might recommend surgury. Asks foir handicap parking application  Cant go shopping without cart or scooter due to right leg pain and numbness. Former smoker but quit 20 yrs ago.  Also with hx of PAD - 75% on right and 50% on left.  Last MRI lumbar 2015, last vascular eval 2016.   Past Medical History:  Diagnosis Date  . ABSCESS, GLUTEAL 06/14/2009  . 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 Anal fissure   . BACK PAIN 01/16/2010  . Diverticulosis     . ERECTILE DYSFUNCTION 12/31/2006  . FEVER UNSPECIFIED 06/08/2007  . GERD 09/29/2006  . GLUCOSE INTOLERANCE 12/31/2006  . HEMORRHOIDS 09/29/2006  . HYPERLIPIDEMIA 09/29/2006  . HYPERSOMNIA 04/30/2009  . HYPERTENSION 09/29/2006  . Impaired fasting glucose 01/01/2007  . KNEE PAIN, LEFT 10/18/2008  . LOW BACK PAIN 12/31/2006  . OBESITY 09/29/2006  . Sleep apnea    cpap  . SYNCOPE, VASOVAGAL 03/14/2008  . VERTEBRAL FRACTURE 09/29/2006   Past Surgical History:  Procedure Laterality Date  . COLONOSCOPY    . drainage of recurrent perirectal abceses    . fistula-in-ano repair    . HEMORRHOID SURGERY      reports that he quit smoking about 19 years ago. He has never used smokeless tobacco. He reports that he drinks about 8.4 oz of alcohol per week . He reports that he does not use drugs. family history includes Allergies in his sister; Colon cancer (age of onset: 8) in his father; Colon polyps in his brother; Diabetes in his mother; Heart disease in his father; Prostate cancer in his father. No Known Allergies Current Outpatient Prescriptions on File Prior to Visit  Medication Sig Dispense Refill  . aspirin 81 MG EC tablet Take 81 mg by mouth daily.      . fenofibrate 160 MG tablet TAKE 1 TABLET (160 MG TOTAL) BY MOUTH DAILY. 90 tablet 2  . lisinopril-hydrochlorothiazide (PRINZIDE,ZESTORETIC) 20-12.5 MG per tablet Take 2 tablets by mouth daily. 180 tablet  3  . pantoprazole (PROTONIX) 40 MG tablet take 1 tablet by mouth twice a day 60 tablet 3  . CIALIS 20 MG tablet TAKE 1 TABLET EVERY DAY (Patient not taking: Reported on 05/23/2016) 5 tablet 0   No current facility-administered medications on file prior to visit.    Review of Systems Constitutional: Negative for other unusual diaphoresis, sweats, appetite or weight changes HENT: Negative for other worsening hearing loss, ear pain, facial swelling, mouth sores or neck stiffness.   Eyes: Negative for other worsening pain, redness or other visual  disturbance.  Respiratory: Negative for other stridor or swelling Cardiovascular: Negative for other palpitations or other chest pain  Gastrointestinal: Negative for worsening diarrhea or loose stools, blood in stool, distention or other pain Genitourinary: Negative for hematuria, flank pain or other change in urine volume.  Musculoskeletal: Negative for myalgias or other joint swelling.  Skin: Negative for other color change, or other wound or worsening drainage.  Neurological: Negative for other syncope or numbness. Hematological: Negative for other adenopathy or swelling Psychiatric/Behavioral: Negative for hallucinations, other worsening agitation, SI, self-injury, or new decreased concentration All other system neg per pt    Objective:   Physical Exam BP 122/78 (BP Location: Left Arm, Patient Position: Sitting, Cuff Size: Large)   Pulse (!) 55   Temp 97.8 F (36.6 C) (Oral)   Ht 5\' 9"  (1.753 m)   Wt 300 lb (136.1 kg)   SpO2 100%   BMI 44.30 kg/m  VS noted,  Constitutional: Pt is oriented to person, place, and time. Appears well-developed and well-nourished, in no significant distress and comfortable Head: Normocephalic and atraumatic  Eyes: Conjunctivae and EOM are normal. Pupils are equal, round, and reactive to light Right Ear: External ear normal without discharge Left Ear: External ear normal without discharge Nose: Nose without discharge or deformity Mouth/Throat: Oropharynx is without other ulcerations and moist  Neck: Normal range of motion. Neck supple. No JVD present. No tracheal deviation present or significant neck LA or mass Cardiovascular: Normal rate, regular rhythm, normal heart sounds and intact distal pulses.   Pulmonary/Chest: WOB normal and breath sounds without rales or wheezing  Abdominal: Soft. Bowel sounds are normal. NT. No HSM  Musculoskeletal: Normal range of motion. Exhibits no edema Lymphadenopathy: Has no other cervical adenopathy.  Neurological:  Pt is alert and oriented to person, place, and time. Pt has normal reflexes. No cranial nerve deficit. Motor grossly intact except for RLE 4+/5,, Gait intact Skin: Skin is warm and dry. No rash noted or new ulcerations, dorsalis pedis trace to 1+ bilat Psychiatric:  Has normal mood and affect. Behavior is normal without agitation No other exam findings  Lab Results  Component Value Date   WBC 7.6 08/01/2016   HGB 12.3 (L) 08/01/2016   HCT 35.8 (L) 08/01/2016   PLT 198.0 08/01/2016   GLUCOSE 106 (H) 08/01/2016   CHOL 178 08/01/2016   TRIG 124.0 08/01/2016   HDL 48.90 08/01/2016   LDLDIRECT 134.8 04/29/2011   LDLCALC 104 (H) 08/01/2016   ALT 20 08/01/2016   AST 23 08/01/2016   NA 142 08/01/2016   K 3.2 (L) 08/01/2016   CL 100 08/01/2016   CREATININE 1.13 08/01/2016   BUN 16 08/01/2016   CO2 31 08/01/2016   TSH 2.40 08/01/2016   PSA 1.65 08/01/2016   HGBA1C 4.9 08/01/2016   MICROALBUR 0.7 07/25/2015    ECG today I have personally interpreted Marked sinus  Bradycardia     Assessment & Plan:

## 2016-08-01 NOTE — Patient Instructions (Signed)
Your EKG was [employer] are given the handicap parking application signed today  Please take all new medication as prescribed - the gabapentin at 100 mg three times per day for 1 wk, then 300 mg three times per day after  Please take all new medication as prescribed - the crestor 10 mg per day for cholesterol  You will be contacted regarding the referral for: Dr Tamala Julian - Sports medicine in this office for the back, (though you can make an appt as you leave at the desk as well)  You will be contacted regarding the referral for: leg circulation test  Please continue all other medications as before, and refills have been done if requested.  Please have the pharmacy call with any other refills you may need.  Please continue your efforts at being more active, low cholesterol diet, and weight control.  You are otherwise up to date with prevention measures today.  Please keep your appointments with your specialists as you may have planned  Please go to the LAB in the Basement (turn left off the elevator) for the tests to be done today  You will be contacted by phone if any changes need to be made immediately.  Otherwise, you will receive a letter about your results with an explanation, but please check with MyChart first.  Please remember to sign up for MyChart if you have not done so, as this will be important to you in the future with finding out test results, communicating by private email, and scheduling acute appointments online when needed.  Please return in 6 months, or sooner if needed

## 2016-08-01 NOTE — Progress Notes (Signed)
Pre visit review using our clinic review tool, if applicable. No additional management support is needed unless otherwise documented below in the visit note. 

## 2016-08-03 NOTE — Assessment & Plan Note (Addendum)
Persistent and significant chronic impairment, for gabapentin asd, and refer sports med in this office for further consideration as pt is not accepting of surgury as prior recommended  \In addition to the time spent performing CPE, I spent an additional 40 minutes face to face,in which greater than 50% of this time was spent in counseling and coordination of care for patient's acute illness as documented, including the differential diagnosis, evaluation, treament and other management of lumbar radiculopathy, HLD, bradycardia with dizziness, HTn, possibly more symptomatic PAD and elevated blood sugars

## 2016-08-03 NOTE — Assessment & Plan Note (Signed)

## 2016-08-03 NOTE — Assessment & Plan Note (Signed)
stable overall by history and exam, recent data reviewed with pt, and pt to continue medical treatment as before,  to f/u any worsening symptoms or concerns Lab Results  Component Value Date   HGBA1C 4.9 08/01/2016

## 2016-08-03 NOTE — Assessment & Plan Note (Signed)
?   Assoc  With bradycardia, ecg reviewed, on no neg chronotropes, for card referral

## 2016-08-03 NOTE — Assessment & Plan Note (Signed)
Unclear if symptomatic in addition to radicular pain, for LE arterial doppler studies

## 2016-08-03 NOTE — Assessment & Plan Note (Signed)
stable overall by history and exam, recent data reviewed with pt, and pt to continue medical treatment as before,  to f/u any worsening symptoms or concerns BP Readings from Last 3 Encounters:  08/01/16 122/78  06/12/16 104/64  06/06/16 132/79

## 2016-08-03 NOTE — Assessment & Plan Note (Signed)
Uncontrolled, for crestor 10 qd, ,lower chol diet,  to f/u any worsening symptoms or concerns

## 2016-08-04 ENCOUNTER — Telehealth: Payer: Self-pay | Admitting: Internal Medicine

## 2016-08-04 MED ORDER — GABAPENTIN 100 MG PO CAPS: 100.0000 mg | ORAL_CAPSULE | Freq: Three times a day (TID) | ORAL | 0 refills | Status: DC

## 2016-08-04 NOTE — Telephone Encounter (Signed)
Per ov note from 7/6, MD states to take Gabapentin 100 mg three times a day for 1 week. Sent rx to [facility]...Johny Chess

## 2016-08-04 NOTE — Telephone Encounter (Signed)
Pt called in and said that he was not given the 100mg  Gabapentin .  He got the 300mg  but not the 100mg   [facility] on [address]  [phone]

## 2016-08-07 ENCOUNTER — Other Ambulatory Visit: Payer: Self-pay

## 2016-08-07 MED ORDER — LISINOPRIL-HYDROCHLOROTHIAZIDE 20-12.5 MG PO TABS: 2.0000 | ORAL_TABLET | Freq: Every day | ORAL | 3 refills | Status: DC

## 2016-08-22 ENCOUNTER — Ambulatory Visit: Payer: 59 | Admitting: Family Medicine

## 2016-09-20 NOTE — Progress Notes (Signed)
[facility] [address], [place] [address] Phone: [phone] Subjective:    I'm seeing this patient by the request  of:  Biagio Borg, MD   CC: back and lower leg pain  GYI:[id number]  Tony Savage is a 61 y.o. male coming in with complaint of back and lower leg pain.patient is morbidly obese and was attempting to start to walk on a regular basis but finds it difficult. Patient unfortunately has been walking less and less. Patient states that he is unable to even go to the store without having to use a cart or scooter. States that most the pain seems be in the right leg and is associated with numbness.Past medical history is significant for peripheral artery disease and he is a smoker.Patient states and is unable to walk long distances and is missing different family event secondary to pain. Rates the severity of 8 out of 10 and worsening when it occurs.    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 ABSCESS, GLUTEAL 06/14/2009  . 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 Anal fissure   . BACK PAIN 01/16/2010  . Diverticulosis   . ERECTILE DYSFUNCTION 12/31/2006  . FEVER UNSPECIFIED 06/08/2007  . GERD 09/29/2006  . GLUCOSE INTOLERANCE 12/31/2006  . HEMORRHOIDS 09/29/2006  . HYPERLIPIDEMIA 09/29/2006  . HYPERSOMNIA 04/30/2009  . HYPERTENSION 09/29/2006  . Impaired fasting glucose 01/01/2007  . KNEE PAIN, LEFT 10/18/2008  . LOW BACK PAIN 12/31/2006  . OBESITY 09/29/2006  . Sleep apnea    cpap  . SYNCOPE, VASOVAGAL 03/14/2008  . VERTEBRAL FRACTURE 09/29/2006   Past Surgical History:  Procedure Laterality Date  . COLONOSCOPY    . drainage of recurrent perirectal  abceses    . fistula-in-ano repair    . HEMORRHOID SURGERY     Social History   Social History  . Marital status: Married    Spouse name: N/A  . Number of children: 1  . Years of education: N/A   Occupational History  . warehouse ordering clerk Unemployed   Social History Main Topics  . Smoking status: Former Smoker    Quit date: 03/19/1997  . Smokeless tobacco: Never Used     Comment: 1 and 1/2 ppd for 15 years, Quit 1999  . Alcohol use 8.4 oz/week    14 Cans of beer per week  . Drug use: No  . Sexual activity: Not Asked   Other Topics Concern  . None   Social History Narrative  . None   No Known Allergies Family History  Problem Relation Age of Onset  . Heart disease Father        CHF  . Prostate cancer Father        went from prostate to colon  . Colon cancer Father 65  . Allergies Sister   . Colon polyps Brother   . Diabetes Mother   . Esophageal cancer Neg Hx   . Stomach cancer Neg Hx   . Rectal cancer Neg Hx      Past medical history, social, surgical and family history all reviewed in electronic medical record.  No pertanent information unless stated regarding to the chief complaint.   Review of  Systems:Review of systems updated and as accurate as of 09/22/16  No headache, visual changes, nausea, vomiting, diarrhea, constipation, dizziness, abdominal pain, skin rash, fevers, chills, night sweats, weight loss, swollen lymph nodes, body aches, joint swelling, chest pain, shortness of breath, mood changes. Positive muscle aches  Objective  Blood pressure 130/80, pulse (!) 58, height 5\' 9"  (1.753 m), weight (!) 304 lb (137.9 kg), SpO2 97 %. Systems examined below as of 09/22/16   General: No apparent distress alert and oriented x3 mood and affect normal, dressed appropriately. Morbidly obese HEENT: Pupils equal, extraocular movements intact  Respiratory: Patient's speak in full sentences and does not appear short of breath  Cardiovascular: No lower  extremity edema, non tender, no erythema  Skin: Warm dry intact with no signs of infection or rash on extremities or on axial skeleton.  Abdomen: Soft nontender  Neuro: Cranial nerves II through XII are intact, neurovascularly intact in all extremities with 2+ DTRs and 2+ pulses.  Lymph: No lymphadenopathy of posterior or anterior cervical chain or axillae bilaterally.  Gait normal with good balance and coordination.  MSK:  Non tender with full range of motion and good stability and symmetric strength and tone of shoulders, elbows, wrist, hip, knee and ankles bilaterally.  Back Exam:  Inspection:  Loss of lordosis Motion: Flexion 25 deg, Extension 15 deg, Side Bending to 25 deg bilaterally,  Rotation to 25 deg bilaterally  SLR laying: Negative  XSLR laying: Negative  Palpable tenderness: Tender to palpation of the paraspinal musculature lumbar spine L5-S1 bilaterally. FABER: Significant tightness bilaterally. Sensory change: Gross sensation intact to all lumbar and sacral dermatomes.  Reflexes: 2+ at both patellar tendons, 2+ at achilles tendons, Babinski's downgoing.  Strength at foot  Plantar-flexion: 5/5 Dorsi-flexion: 5/5 Eversion: 5/5 Inversion: 5/5  Leg strength  Quad: 5/5 Hamstring: 5/5 Hip flexor: 5/5 Hip abductors: 4/5 but symmetric Gait unremarkable.  [id number]; 15 additional minutes spent for Therapeutic exercises as stated in above notes.  This included exercises focusing on stretching, strengthening, with significant focus on eccentric aspects.   Long term goals include an improvement in range of motion, strength, endurance as well as avoiding reinjury. Patient's frequency would include in 1-2 times a day, 3-5 times a week for a duration of 6-12 weeks. Low back exercises that included:  Pelvic tilt/bracing instruction to focus on control of the pelvic girdle and lower abdominal muscles  Glute strengthening exercises, focusing on proper firing of the glutes without engaging the low  back muscles Proper stretching techniques for maximum relief for the hamstrings, hip flexors, low back and some rotation where tolerated    Proper technique shown and discussed handout in great detail with ATC.  All questions were discussed and answered.     Impression and Recommendations:     This case required medical decision making of moderate complexity.      Note: This dictation was prepared with Dragon dictation along with smaller phrase technology. Any transcriptional errors that result from this process are unintentional.

## 2016-09-22 ENCOUNTER — Ambulatory Visit (INDEPENDENT_AMBULATORY_CARE_PROVIDER_SITE_OTHER)
Admission: RE | Admit: 2016-09-22 | Discharge: 2016-09-22 | Disposition: A | Discharge status: Home / Self Care | Payer: 59 | Source: Ambulatory Visit | Attending: Family Medicine | Admitting: Family Medicine

## 2016-09-22 ENCOUNTER — Ambulatory Visit (INDEPENDENT_AMBULATORY_CARE_PROVIDER_SITE_OTHER): Payer: 59 | Admitting: Family Medicine

## 2016-09-22 ENCOUNTER — Encounter: Payer: Self-pay | Admitting: Family Medicine

## 2016-09-22 VITALS — BP 130/80 | HR 58 | Ht 69.0 in | Wt 304.0 lb

## 2016-09-22 DIAGNOSIS — M5416 Radiculopathy, lumbar region: Secondary | ICD-10-CM | POA: Diagnosis not present

## 2016-09-22 DIAGNOSIS — M48062 Spinal stenosis, lumbar region with neurogenic claudication: Secondary | ICD-10-CM

## 2016-09-22 DIAGNOSIS — M545 Low back pain: Secondary | ICD-10-CM | POA: Diagnosis not present

## 2016-09-22 DIAGNOSIS — M48061 Spinal stenosis, lumbar region without neurogenic claudication: Secondary | ICD-10-CM | POA: Insufficient documentation

## 2016-09-22 MED ORDER — VITAMIN D (ERGOCALCIFEROL) 1.25 MG (50000 UNIT) PO CAPS: 50000.0000 [IU] | ORAL_CAPSULE | ORAL | 0 refills | Status: DC

## 2016-09-22 NOTE — Assessment & Plan Note (Signed)
Patient is having signs and symptoms that seem to be consistent with a lumbar spinal stenosis. Think patient is having unfortunately compression of the nerve roots. Does correspond to what was seen on his MRI 5 years ago. Likely does have L5-S1 nerve root impingement intermittently. Patient does not have any weakness but significant tightness. Possible radicular signs. Encourage to gabapentin, start once weekly vitamin D for muscle strength and endurance, discussed icing regimen, work with Product/process development scientist to learn home exercises, patient will follow-up with me again in 3-4 weeks. Patient would be a candidate for an epidural steroid injection if needed.

## 2016-09-22 NOTE — Patient Instructions (Signed)
Good to see you  Go [place]! Xray downstairs Continue the gabapentin  Once weekly vitamin D for 12 weeks.  Recumbent bike, elliptical or swimming would be great  Ice 20 minutes 2 times daily. Usually after activity and before bed. Exercises 3 times a week.  Over the counter Tart cherry extract any dose  See me again in 4 weeks.

## 2016-10-09 DIAGNOSIS — H40053 Ocular hypertension, bilateral: Secondary | ICD-10-CM | POA: Diagnosis not present

## 2016-10-15 ENCOUNTER — Other Ambulatory Visit: Payer: Self-pay | Admitting: Internal Medicine

## 2016-10-20 ENCOUNTER — Ambulatory Visit: Payer: 59 | Admitting: Family Medicine

## 2016-10-30 ENCOUNTER — Other Ambulatory Visit: Payer: Self-pay | Admitting: Internal Medicine

## 2016-11-19 NOTE — Progress Notes (Signed)
[facility] [address], [place] [address] Phone: [phone] Subjective:    I'm seeing this patient by the request  of:  Biagio Borg, MD   CC: Low back pain follow-up  TGG:YIRSWNIOEV  Tony Savage is a 61 y.o. male coming in with complaint of low back pain. Patient was seen 2 months ago in with patient's morbid obesity having difficulty with walking. Patient did have an MRI in 2013 that showed significant degenerative disc disease and bilateral pars defect at L5 with bilateral foraminal stenosis at L5-S1. And moderate spinal stenosis at that time. We did get repeat x-rays on 09/22/2016. These were individually visualized by me. Showed some further amount of arthritis but stable L5-S1 spondylolisthesis. Patient was to continue the gabapentin, started on once weekly vitamin D, change activities to more of a low impact exercises. Patient given home exercises as well. Patient states that he has been walking 5 days a week. His leg goes numb after 15 minutes. He said that the gabapentin does seem to be helping him. He has also been doing the exercises which have helped.      Past Medical History:  Diagnosis Date  . ABSCESS, GLUTEAL 06/14/2009  . 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 Anal fissure   . BACK PAIN 01/16/2010  . Diverticulosis   . ERECTILE DYSFUNCTION 12/31/2006  . FEVER UNSPECIFIED 06/08/2007  . GERD 09/29/2006  . GLUCOSE INTOLERANCE 12/31/2006  . HEMORRHOIDS 09/29/2006  . HYPERLIPIDEMIA 09/29/2006  . HYPERSOMNIA 04/30/2009  . HYPERTENSION 09/29/2006  . Impaired fasting glucose 01/01/2007  . KNEE PAIN, LEFT 10/18/2008  . LOW BACK PAIN 12/31/2006  . OBESITY 09/29/2006  . Sleep apnea    cpap  . SYNCOPE, VASOVAGAL 03/14/2008  . VERTEBRAL FRACTURE 09/29/2006   Past Surgical History:  Procedure Laterality Date  . COLONOSCOPY    . drainage of recurrent perirectal abceses    . fistula-in-ano repair    . HEMORRHOID SURGERY     Social History    Social History  . Marital status: Married    Spouse name: N/A  . Number of children: 1  . Years of education: N/A   Occupational History  . warehouse ordering clerk Unemployed   Social History Main Topics  . Smoking status: Former Smoker    Quit date: 03/19/1997  . Smokeless tobacco: Never Used     Comment: 1 and 1/2 ppd for 15 years, Quit 1999  . Alcohol use 8.4 oz/week    14 Cans of beer per week  . Drug use: No  . Sexual activity: Not Asked   Other Topics Concern  . None   Social History Narrative  . None   No Known Allergies Family History  Problem Relation Age of Onset  . Heart disease Father        CHF  . Prostate cancer Father        went from prostate to colon  . Colon cancer Father 44  . Allergies Sister   . Colon polyps Brother   . Diabetes Mother   . Esophageal cancer Neg Hx   . Stomach cancer Neg Hx   . Rectal cancer Neg Hx      Past medical history, social, surgical and family history all reviewed in electronic medical record.  No pertanent information unless stated regarding to the chief complaint.   Review of Systems:Review of systems updated and as accurate as of 11/20/16  No headache, visual changes, nausea, vomiting,  diarrhea, constipation, dizziness, abdominal pain, skin rash, fevers, chills, night sweats, weight loss, swollen lymph nodes, body aches, joint swelling, chest pain, shortness of breath, mood changes.  Positive muscle aches  Objective  Blood pressure 122/84, pulse (!) 57, height 5\' 9"  (1.753 m), weight (!) 301 lb (136.5 kg), SpO2 98 %. Systems examined below as of 11/20/16   General: No apparent distress alert and oriented x3 mood and affect normal, dressed appropriately.  HEENT: Pupils equal, extraocular movements intact  Respiratory: Patient's speak in full sentences and does not appear short of breath  Cardiovascular: No lower extremity edema, non tender, no erythema  Skin: Warm dry intact with no signs of infection or rash  on extremities or on axial skeleton.  Abdomen: Soft nontender  Neuro: Cranial nerves II through XII are intact, neurovascularly intact in all extremities with 2+ DTRs and 2+ pulses.  Lymph: No lymphadenopathy of posterior or anterior cervical chain or axillae bilaterally.  Gait normal with good balance and coordination.  MSK:  Non tender with full range of motion and good stability and symmetric strength and tone of shoulders, elbows, wrist, hip, knee and ankles bilaterally.  Back Exam:  Inspection: Mild loss of stenosis poor core strength Motion: Flexion 35 deg, Extension 25 deg, Side Bending to 35 deg bilaterally,  Rotation to 35 deg bilaterally  SLR laying: Positive right XSLR laying: Negative  Palpable tenderness: Tender to palpation. FABER: Positive right. Sensory change: Gross sensation intact to all lumbar and sacral dermatomes.  Reflexes: 2+ at both patellar tendons, 2+ at achilles tendons, Babinski's downgoing.  Strength at foot  Plantar-flexion: 5/5 Dorsi-flexion: 5/5 Eversion: 5/5 Inversion: 5/5  Leg strength  Quad: 5/5 Hamstring: 5/5 Hip flexor: 5/5 Hip abductors: 4/5  Gait unremarkable.    Impression and Recommendations:     This case required medical decision making of moderate complexity.      Note: This dictation was prepared with Dragon dictation along with smaller phrase technology. Any transcriptional errors that result from this process are unintentional.

## 2016-11-20 ENCOUNTER — Ambulatory Visit (INDEPENDENT_AMBULATORY_CARE_PROVIDER_SITE_OTHER): Payer: 59 | Admitting: Family Medicine

## 2016-11-20 ENCOUNTER — Encounter: Payer: Self-pay | Admitting: Family Medicine

## 2016-11-20 DIAGNOSIS — M48062 Spinal stenosis, lumbar region with neurogenic claudication: Secondary | ICD-10-CM | POA: Diagnosis not present

## 2016-11-20 NOTE — Assessment & Plan Note (Signed)
Patient is doing relatively well. Continue the gabapentin at the same dosing at this point. We did discuss possibly increasing to 400 mg. Patient will consider this. Patient is still taking rest every 15 minutes of walking. Discussed with him that we may need advance imaging with him still having some weakness. Patient declined that at this point and wants to continue with the conservative therapy. Patient will be following up with me again in 2 months

## 2016-11-20 NOTE — Patient Instructions (Signed)
Good to see you  Tony Savage is your friend.  Keep doing the gabapentin but can change the dosing if you need  Keep doing the vitamins Keep working on the weight See me again in 2 months

## 2017-01-19 ENCOUNTER — Telehealth: Payer: Self-pay | Admitting: Gastroenterology

## 2017-01-19 DIAGNOSIS — Z1211 Encounter for screening for malignant neoplasm of colon: Secondary | ICD-10-CM

## 2017-01-28 NOTE — Telephone Encounter (Signed)
Patient was to have a BE 2 weeks after a incomplete colonoscopy.  He cancelled it.  Ok to reschedule directly now?

## 2017-01-28 NOTE — Telephone Encounter (Signed)
Patient notified that test is for a BE not colonoscopy.  He is not sure of his schedule and wants to call to schedule it himself.  I provided him the number to central scheduling.  I mailed him instructions and he will fill in the blanks with the dates and time.  He will call back if he has any questions once he receives the instructions.

## 2017-01-28 NOTE — Telephone Encounter (Signed)
Yes reschedule [facility]

## 2017-02-02 ENCOUNTER — Ambulatory Visit: Payer: 59 | Admitting: Internal Medicine

## 2017-02-03 ENCOUNTER — Ambulatory Visit: Payer: 59 | Admitting: Internal Medicine

## 2017-02-09 ENCOUNTER — Other Ambulatory Visit: Payer: Self-pay | Admitting: Internal Medicine

## 2017-02-17 ENCOUNTER — Ambulatory Visit: Payer: 59 | Admitting: Internal Medicine

## 2017-02-17 ENCOUNTER — Other Ambulatory Visit (INDEPENDENT_AMBULATORY_CARE_PROVIDER_SITE_OTHER): Payer: 59

## 2017-02-17 ENCOUNTER — Encounter: Payer: Self-pay | Admitting: Internal Medicine

## 2017-02-17 VITALS — BP 126/88 | HR 65 | Temp 98.0°F | Ht 69.0 in | Wt 301.0 lb

## 2017-02-17 DIAGNOSIS — R7302 Impaired glucose tolerance (oral): Secondary | ICD-10-CM

## 2017-02-17 DIAGNOSIS — E785 Hyperlipidemia, unspecified: Secondary | ICD-10-CM

## 2017-02-17 DIAGNOSIS — I10 Essential (primary) hypertension: Secondary | ICD-10-CM

## 2017-02-17 DIAGNOSIS — Z0000 Encounter for general adult medical examination without abnormal findings: Secondary | ICD-10-CM

## 2017-02-17 LAB — CBC WITH DIFFERENTIAL/PLATELET
BASOS ABS: 0 10*3/uL (ref 0.0–0.1)
Basophils Relative: 0.6 % (ref 0.0–3.0)
EOS ABS: 0.2 10*3/uL (ref 0.0–0.7)
Eosinophils Relative: 3.1 % (ref 0.0–5.0)
HCT: 35.4 % — ABNORMAL LOW (ref 39.0–52.0)
Hemoglobin: 12 g/dL — ABNORMAL LOW (ref 13.0–17.0)
LYMPHS ABS: 1.8 10*3/uL (ref 0.7–4.0)
Lymphocytes Relative: 31.2 % (ref 12.0–46.0)
MCHC: 33.8 g/dL (ref 30.0–36.0)
MCV: 97.5 fl (ref 78.0–100.0)
MONO ABS: 0.5 10*3/uL (ref 0.1–1.0)
Monocytes Relative: 8.4 % (ref 3.0–12.0)
NEUTROS ABS: 3.3 10*3/uL (ref 1.4–7.7)
NEUTROS PCT: 56.7 % (ref 43.0–77.0)
PLATELETS: 175 10*3/uL (ref 150.0–400.0)
RBC: 3.63 Mil/uL — AB (ref 4.22–5.81)
RDW: 13.7 % (ref 11.5–15.5)
WBC: 5.8 10*3/uL (ref 4.0–10.5)

## 2017-02-17 LAB — BASIC METABOLIC PANEL
BUN: 15 mg/dL (ref 6–23)
CHLORIDE: 100 meq/L (ref 96–112)
CO2: 32 meq/L (ref 19–32)
CREATININE: 1.04 mg/dL (ref 0.40–1.50)
Calcium: 9.6 mg/dL (ref 8.4–10.5)
GFR: 93.77 mL/min (ref >60.00)
GLUCOSE: 96 mg/dL (ref 70–99)
Potassium: 3.4 mEq/L — ABNORMAL LOW (ref 3.5–5.1)
Sodium: 140 mEq/L (ref 135–145)

## 2017-02-17 LAB — HEMOGLOBIN A1C: Hgb A1c MFr Bld: 4.9 % (ref 4.6–6.5)

## 2017-02-17 LAB — TSH: TSH: 3.88 u[IU]/mL (ref 0.35–4.50)

## 2017-02-17 LAB — LIPID PANEL
CHOL/HDL RATIO: 3
Cholesterol: 138 mg/dL (ref 0–200)
HDL: 46.4 mg/dL (ref >39.00)
LDL CALC: 69 mg/dL (ref 0–99)
NONHDL: 91.29
Triglycerides: 112 mg/dL (ref 0.0–149.0)
VLDL: 22.4 mg/dL (ref 0.0–40.0)

## 2017-02-17 LAB — HEPATIC FUNCTION PANEL
ALBUMIN: 4.4 g/dL (ref 3.5–5.2)
ALK PHOS: 78 U/L (ref 39–117)
ALT: 21 U/L (ref 0–53)
AST: 31 U/L (ref 0–37)
BILIRUBIN DIRECT: 0.2 mg/dL (ref 0.0–0.3)
BILIRUBIN TOTAL: 1.1 mg/dL (ref 0.2–1.2)
Total Protein: 6.5 g/dL (ref 6.0–8.3)

## 2017-02-17 LAB — PSA: PSA: 1.63 ng/mL (ref 0.10–4.00)

## 2017-02-17 NOTE — Progress Notes (Signed)
Subjective:    Patient ID: Tony Savage, male    DOB: 07/30/1955, 62 y.o.   MRN: [id number]  HPI  Here for wellness and f/u;  Overall doing ok;  Pt denies Chest pain, worsening SOB, DOE, wheezing, orthopnea, PND, worsening LE edema, palpitations, dizziness or syncope.  Pt denies neurological change such as new headache, facial or extremity weakness.  Pt denies polydipsia, polyuria, or low sugar symptoms. Pt states overall good compliance with treatment and medications, good tolerability, and has been trying to follow appropriate diet.  Pt denies worsening depressive symptoms, suicidal ideation or panic. No fever, night sweats, wt loss, loss of appetite, or other constitutional symptoms.  Pt states good ability with ADL's, has low fall risk, home safety reviewed and adequate, no other significant changes in hearing or vision, and only occasionally active with exercise. Due for barium enema test tomorrow per GI.  On doxy course now for buttock abscess. 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  Pt continues to have recurring LBP without change in severity, bowel or bladder change, fever, wt loss, gait change or falls, but overall worse in severity and frequency and impairment, likes the gabapentin b/c can now walk slowly 400 yds without stopping to rest.  Also rides stationary bike b/c does not hurt the legs.  Still cannot work due to leg pain even with sitting when his right leg goes numb.  Has been seen previous per ortho and sports medicine, and was adivsed for pain management since it did not seem bad enough for surgury at that time.  Still overall flares to the point about 3 days last yr so bad he could not even get OOB to BR, required help.  Though overall worsening, he is not wanting surgury, so does not want MRI.  May apply for disability. Past Medical History:  Diagnosis Date  . ABSCESS, GLUTEAL 06/14/2009  . 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  Anal fissure   . BACK PAIN 01/16/2010  . Diverticulosis   . ERECTILE DYSFUNCTION 12/31/2006  . FEVER UNSPECIFIED 06/08/2007  . GERD 09/29/2006  . GLUCOSE INTOLERANCE 12/31/2006  . HEMORRHOIDS 09/29/2006  . HYPERLIPIDEMIA 09/29/2006  . HYPERSOMNIA 04/30/2009  . HYPERTENSION 09/29/2006  . Impaired fasting glucose 01/01/2007  . KNEE PAIN, LEFT 10/18/2008  . LOW BACK PAIN 12/31/2006  . OBESITY 09/29/2006  . Sleep apnea    cpap  . SYNCOPE, VASOVAGAL 03/14/2008  . VERTEBRAL FRACTURE 09/29/2006   Past Surgical History:  Procedure Laterality Date  . COLONOSCOPY    . drainage of recurrent perirectal abceses    . fistula-in-ano repair    . HEMORRHOID SURGERY      reports that he quit smoking about 19 years ago. he has never used smokeless tobacco. He reports that he drinks about 8.4 oz of alcohol per week. He reports that he does not use drugs. family history includes Allergies in his sister; Colon cancer (age of onset: 64) in his father; Colon polyps in his brother; Diabetes in his mother; Heart disease in his father; Prostate cancer in his father. No Known Allergies Current Outpatient Medications on File Prior to Visit  Medication Sig Dispense Refill  . aspirin 81 MG EC tablet Take 81 mg by mouth daily.      [name] Kitchen CIALIS 20 MG tablet TAKE 1 TABLET EVERY DAY 5 tablet 0  . fenofibrate 160 MG tablet take 1 tablet by mouth daily 60 tablet 1  .  gabapentin (NEURONTIN) 300 MG capsule Take 1 capsule (300 mg total) by mouth 3 (three) times daily. To start after the lower dose for 1 week 90 capsule 5  . lisinopril-hydrochlorothiazide (PRINZIDE,ZESTORETIC) 20-12.5 MG tablet Take 2 tablets by mouth daily. 180 tablet 3  . pantoprazole (PROTONIX) 40 MG tablet take 1 tablet by mouth twice a day 60 tablet 0  . potassium chloride (MICRO-K) 10 MEQ CR capsule Take 1 capsule (10 mEq total) by mouth daily. 90 capsule 3  . rosuvastatin (CRESTOR) 10 MG tablet Take 1 tablet (10 mg total) by mouth daily. 90 tablet 3  . Vitamin D,  Ergocalciferol, (DRISDOL) 50000 units CAPS capsule Take 1 capsule (50,000 Units total) by mouth every 7 (seven) days. 12 capsule 0   No current facility-administered medications on file prior to visit.    Review of Systems Constitutional: Negative for other unusual diaphoresis, sweats, appetite or weight changes HENT: Negative for other worsening hearing loss, ear pain, facial swelling, mouth sores or neck stiffness.   Eyes: Negative for other worsening pain, redness or other visual disturbance.  Respiratory: Negative for other stridor or swelling Cardiovascular: Negative for other palpitations or other chest pain  Gastrointestinal: Negative for worsening diarrhea or loose stools, blood in stool, distention or other pain Genitourinary: Negative for hematuria, flank pain or other change in urine volume.  Musculoskeletal: Negative for myalgias or other joint swelling.  Skin: Negative for other color change, or other wound or worsening drainage.  Neurological: Negative for other syncope or numbness. Hematological: Negative for other adenopathy or swelling Psychiatric/Behavioral: Negative for hallucinations, other worsening agitation, SI, self-injury, or new decreased concentration All other system neg per pt    Objective:   Physical Exam BP 126/88   Pulse 65   Temp 98 F (36.7 C) (Oral)   Ht 5\' 9"  (1.753 m)   Wt (!) 301 lb (136.5 kg)   SpO2 100%   BMI 44.45 kg/m  VS noted,  Constitutional: Pt is oriented to person, place, and time. Appears well-developed and well-nourished, in no significant distress and comfortable Head: Normocephalic and atraumatic  Eyes: Conjunctivae and EOM are normal. Pupils are equal, round, and reactive to light Right Ear: External ear normal without discharge Left Ear: External ear normal without discharge Nose: Nose without discharge or deformity Mouth/Throat: Oropharynx is without other ulcerations and moist  Neck: Normal range of motion. Neck supple. No  JVD present. No tracheal deviation present or significant neck LA or mass Cardiovascular: Normal rate, regular rhythm, normal heart sounds and intact distal pulses.   Pulmonary/Chest: WOB normal and breath sounds without rales or wheezing  Abdominal: Soft. Bowel sounds are normal. NT. No HSM  Musculoskeletal: Normal range of motion. Exhibits no edema Lymphadenopathy: Has no other cervical adenopathy.  Neurological: Pt is alert and oriented to person, place, and time. Pt has normal reflexes. No cranial nerve deficit. Motor grossly intact, Gait intact Skin: Skin is warm and dry. No rash noted or new ulcerations Psychiatric:  Has normal mood and affect. Behavior is normal without agitation No other exam findings Lab Results  Component Value Date   WBC 7.6 08/01/2016   HGB 12.3 (L) 08/01/2016   HCT 35.8 (L) 08/01/2016   PLT 198.0 08/01/2016   GLUCOSE 106 (H) 08/01/2016   CHOL 178 08/01/2016   TRIG 124.0 08/01/2016   HDL 48.90 08/01/2016   LDLDIRECT 134.8 04/29/2011   LDLCALC 104 (H) 08/01/2016   ALT 20 08/01/2016   AST 23 08/01/2016   NA  142 08/01/2016   K 3.2 (L) 08/01/2016   CL 100 08/01/2016   CREATININE 1.13 08/01/2016   BUN 16 08/01/2016   CO2 31 08/01/2016   TSH 2.40 08/01/2016   PSA 1.65 08/01/2016   HGBA1C 4.9 08/01/2016   MICROALBUR 0.7 07/25/2015          Assessment & Plan:

## 2017-02-17 NOTE — Patient Instructions (Signed)

## 2017-02-18 ENCOUNTER — Ambulatory Visit (HOSPITAL_COMMUNITY)
Admission: RE | Admit: 2017-02-18 | Discharge: 2017-02-18 | Disposition: A | Discharge status: Home / Self Care | Payer: 59 | Source: Ambulatory Visit | Attending: Gastroenterology | Admitting: Gastroenterology

## 2017-02-18 DIAGNOSIS — K5730 Diverticulosis of large intestine without perforation or abscess without bleeding: Secondary | ICD-10-CM | POA: Insufficient documentation

## 2017-02-18 DIAGNOSIS — Z1211 Encounter for screening for malignant neoplasm of colon: Secondary | ICD-10-CM

## 2017-02-19 ENCOUNTER — Encounter: Payer: Self-pay | Admitting: Internal Medicine

## 2017-02-19 NOTE — Assessment & Plan Note (Signed)
stable overall by history and exam, recent data reviewed with pt, and pt to continue medical treatment as before,  to f/u any worsening symptoms or concerns  

## 2017-02-19 NOTE — Assessment & Plan Note (Signed)
stable overall by history and exam, recent data reviewed with pt, and pt to continue medical treatment as before,  to f/u any worsening symptoms or concerns Lab Results  Component Value Date   HGBA1C 4.9 02/17/2017

## 2017-02-19 NOTE — Assessment & Plan Note (Signed)

## 2017-02-19 NOTE — Assessment & Plan Note (Signed)
stable overall by history and exam, recent data reviewed with pt, and pt to continue medical treatment as before,  to f/u any worsening symptoms or concerns Lab Results  Component Value Date   LDLCALC 69 02/17/2017

## 2017-03-03 ENCOUNTER — Other Ambulatory Visit: Payer: Self-pay | Admitting: Internal Medicine

## 2017-03-11 ENCOUNTER — Other Ambulatory Visit: Payer: Self-pay

## 2017-03-11 MED ORDER — PANTOPRAZOLE SODIUM 40 MG PO TBEC: 40.0000 mg | DELAYED_RELEASE_TABLET | Freq: Two times a day (BID) | ORAL | 11 refills | Status: DC

## 2017-05-09 ENCOUNTER — Other Ambulatory Visit: Payer: Self-pay | Admitting: Internal Medicine

## 2017-06-09 DIAGNOSIS — G4733 Obstructive sleep apnea (adult) (pediatric): Secondary | ICD-10-CM | POA: Diagnosis not present

## 2017-07-07 ENCOUNTER — Other Ambulatory Visit: Payer: Self-pay | Admitting: Internal Medicine

## 2017-07-26 ENCOUNTER — Other Ambulatory Visit: Payer: Self-pay | Admitting: Internal Medicine

## 2017-08-16 ENCOUNTER — Other Ambulatory Visit: Payer: Self-pay | Admitting: Internal Medicine

## 2017-08-18 ENCOUNTER — Ambulatory Visit: Payer: 59 | Admitting: Internal Medicine

## 2017-08-19 ENCOUNTER — Ambulatory Visit: Payer: 59 | Admitting: Internal Medicine

## 2017-09-07 ENCOUNTER — Other Ambulatory Visit: Payer: Self-pay | Admitting: Internal Medicine

## 2017-09-21 ENCOUNTER — Other Ambulatory Visit (INDEPENDENT_AMBULATORY_CARE_PROVIDER_SITE_OTHER): Payer: 59

## 2017-09-21 ENCOUNTER — Encounter: Payer: Self-pay | Admitting: Internal Medicine

## 2017-09-21 ENCOUNTER — Ambulatory Visit (INDEPENDENT_AMBULATORY_CARE_PROVIDER_SITE_OTHER): Payer: 59 | Admitting: Internal Medicine

## 2017-09-21 VITALS — BP 124/78 | HR 60 | Temp 98.0°F | Ht 69.0 in | Wt 288.0 lb

## 2017-09-21 DIAGNOSIS — E785 Hyperlipidemia, unspecified: Secondary | ICD-10-CM | POA: Diagnosis not present

## 2017-09-21 DIAGNOSIS — I10 Essential (primary) hypertension: Secondary | ICD-10-CM | POA: Diagnosis not present

## 2017-09-21 DIAGNOSIS — R7302 Impaired glucose tolerance (oral): Secondary | ICD-10-CM

## 2017-09-21 DIAGNOSIS — I739 Peripheral vascular disease, unspecified: Secondary | ICD-10-CM

## 2017-09-21 DIAGNOSIS — Z0000 Encounter for general adult medical examination without abnormal findings: Secondary | ICD-10-CM

## 2017-09-21 LAB — CBC WITH DIFFERENTIAL/PLATELET
BASOS ABS: 0.1 10*3/uL (ref 0.0–0.1)
Basophils Relative: 1 % (ref 0.0–3.0)
EOS ABS: 0.2 10*3/uL (ref 0.0–0.7)
Eosinophils Relative: 2.6 % (ref 0.0–5.0)
HCT: 35.8 % — ABNORMAL LOW (ref 39.0–52.0)
Hemoglobin: 12.2 g/dL — ABNORMAL LOW (ref 13.0–17.0)
LYMPHS ABS: 1.8 10*3/uL (ref 0.7–4.0)
Lymphocytes Relative: 28.5 % (ref 12.0–46.0)
MCHC: 34 g/dL (ref 30.0–36.0)
MCV: 96.4 fl (ref 78.0–100.0)
MONO ABS: 0.6 10*3/uL (ref 0.1–1.0)
Monocytes Relative: 8.9 % (ref 3.0–12.0)
NEUTROS ABS: 3.7 10*3/uL (ref 1.4–7.7)
NEUTROS PCT: 59 % (ref 43.0–77.0)
PLATELETS: 199 10*3/uL (ref 150.0–400.0)
RBC: 3.72 Mil/uL — ABNORMAL LOW (ref 4.22–5.81)
RDW: 13 % (ref 11.5–15.5)
WBC: 6.4 10*3/uL (ref 4.0–10.5)

## 2017-09-21 LAB — BASIC METABOLIC PANEL
BUN: 18 mg/dL (ref 6–23)
CALCIUM: 9.7 mg/dL (ref 8.4–10.5)
CO2: 29 meq/L (ref 19–32)
CREATININE: 1.28 mg/dL (ref 0.40–1.50)
Chloride: 101 mEq/L (ref 96–112)
GFR: 73.64 mL/min (ref >60.00)
GLUCOSE: 107 mg/dL — AB (ref 70–99)
Potassium: 3.5 mEq/L (ref 3.5–5.1)
Sodium: 140 mEq/L (ref 135–145)

## 2017-09-21 LAB — LIPID PANEL
CHOLESTEROL: 137 mg/dL (ref 0–200)
HDL: 46.7 mg/dL (ref >39.00)
LDL Cholesterol: 69 mg/dL (ref 0–99)
NONHDL: 90.21
TRIGLYCERIDES: 105 mg/dL (ref 0.0–149.0)
Total CHOL/HDL Ratio: 3
VLDL: 21 mg/dL (ref 0.0–40.0)

## 2017-09-21 LAB — HEMOGLOBIN A1C: Hgb A1c MFr Bld: 5.1 % (ref 4.6–6.5)

## 2017-09-21 NOTE — Assessment & Plan Note (Signed)
stable overall by history and exam, recent data reviewed with pt, and pt to continue medical treatment as before,  to f/u any worsening symptoms or concern, for f/u lab today 

## 2017-09-21 NOTE — Patient Instructions (Signed)
Please continue all other medications as before, and refills have been done if requested.  Please have the pharmacy call with any other refills you may need.  Please continue your efforts at being more active, low cholesterol diet, and weight control.  You are otherwise up to date with prevention measures today.  Please keep your appointments with your specialists as you may have planned  You will be contacted regarding the referral for: circulation testing   /Please go to the LAB in the Basement (turn left off the elevator) for the tests to be done today  You will be contacted by phone if any changes need to be made immediately.  Otherwise, you will receive a letter about your results with an explanation, but please check with MyChart first.  Please remember to sign up for MyChart if you have not done so, as this will be important to you in the future with finding out test results, communicating by private email, and scheduling acute appointments online when needed.  Please return in 6 months, or sooner if needed, with Lab testing done 3-5 days before

## 2017-09-21 NOTE — Progress Notes (Signed)
Subjective:    Patient ID: Tony Savage, male    DOB: [birth year], 62 y.o.   MRN: [id number]  HPI    Here to f/u; overall doing ok,  Pt denies chest pain, increasing sob or doe, wheezing, orthopnea, PND, increased LE swelling, palpitations, dizziness or syncope.  Pt denies new neurological symptoms such as new headache, or facial or extremity weakness or numbness.  Pt denies polydipsia, polyuria, or low sugar episode.  Pt states overall good compliance with meds, mostly trying to follow appropriate diet, with wt overall stable,  but little exercise however.  Has lost wt recently with more exercise.  Has more luck with stationary bike than walking that tends to cause right sciatica but may be claudiation as well?  Last vascular studies have been 2016 and has not f/u with vascular since then..  Has been doing more lately.   Tolerating K supplement with food.   Wt Readings from Last 3 Encounters:  09/21/17 288 lb (130.6 kg)  02/17/17 (!) 301 lb (136.5 kg)  11/20/16 (!) 301 lb (136.5 kg)   Past Medical History:  Diagnosis Date  . ABSCESS, GLUTEAL 06/14/2009  . 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 Anal fissure   . BACK PAIN 01/16/2010  . Diverticulosis   . ERECTILE DYSFUNCTION 12/31/2006  . FEVER UNSPECIFIED 06/08/2007  . GERD 09/29/2006  . GLUCOSE INTOLERANCE 12/31/2006  . HEMORRHOIDS 09/29/2006  . HYPERLIPIDEMIA 09/29/2006  . HYPERSOMNIA 04/30/2009  . HYPERTENSION 09/29/2006  . Impaired fasting glucose 01/01/2007  . KNEE PAIN, LEFT 10/18/2008  . LOW BACK PAIN 12/31/2006  . OBESITY 09/29/2006  . Sleep apnea    cpap  . SYNCOPE, VASOVAGAL 03/14/2008  . VERTEBRAL FRACTURE 09/29/2006   Past Surgical History:  Procedure Laterality Date  . COLONOSCOPY    . drainage of recurrent perirectal abceses    . fistula-in-ano repair    . HEMORRHOID SURGERY      reports that he quit smoking about 20 years ago. He has never used smokeless tobacco. He reports that he drinks about 14.0 standard drinks of alcohol  per week. He reports that he does not use drugs. family history includes Allergies in his sister; Colon cancer (age of onset: 80) in his father; Colon polyps in his brother; Diabetes in his mother; Heart disease in his father; Prostate cancer in his father. No Known Allergies Current Outpatient Medications on File Prior to Visit  Medication Sig Dispense Refill  . aspirin 81 MG EC tablet Take 81 mg by mouth daily.      [name] Kitchen CIALIS 20 MG tablet TAKE 1 TABLET EVERY DAY 5 tablet 0  . fenofibrate 160 MG tablet take 1 tablet by mouth once daily 90 tablet 3  . gabapentin (NEURONTIN) 300 MG capsule TAKE 1 CAPSULE BY MOUTH THREE TIMES A DAY*TO START AFTER THE LOWER DOSE FOR 1 WEEK 90 capsule 1  . lisinopril-hydrochlorothiazide (PRINZIDE,ZESTORETIC) 20-12.5 MG tablet TAKE 2 TABLETS BY MOUTH EVERY DAY 60 tablet 5  . pantoprazole (PROTONIX) 40 MG tablet TAKE 1 TABLET BY MOUTH TWICE A DAY 60 tablet 5  . potassium chloride (MICRO-K) 10 MEQ CR capsule TAKE 1 CAPSULE BY MOUTH DAILY 90 capsule 1  . rosuvastatin (CRESTOR) 10 MG tablet TAKE 1 TABLET BY MOUTH DAILY 90 tablet 1  . Vitamin D, Ergocalciferol, (DRISDOL) 50000 units CAPS capsule Take 1 capsule (50,000 Units total) by mouth every 7 (seven) days. 12 capsule 0   No current facility-administered medications on file prior to visit.  Review of Systems  Constitutional: Negative for other unusual diaphoresis or sweats HENT: Negative for ear discharge or swelling Eyes: Negative for other worsening visual disturbances Respiratory: Negative for stridor or other swelling  Gastrointestinal: Negative for worsening distension or other blood Genitourinary: Negative for retention or other urinary change Musculoskeletal: Negative for other MSK pain or swelling Skin: Negative for color change or other new lesions Neurological: Negative for worsening tremors and other numbness  Psychiatric/Behavioral: Negative for worsening agitation or other fatigue All other system  neg per pt    Objective:   Physical Exam BP 124/78   Pulse 60   Temp 98 F (36.7 C) (Oral)   Ht 5\' 9"  (1.753 m)   Wt 288 lb (130.6 kg)   SpO2 97%   BMI 42.53 kg/m  VS noted,  Constitutional: Pt appears in NAD HENT: Head: NCAT.  Right Ear: External ear normal.  Left Ear: External ear normal.  Eyes: . Pupils are equal, round, and reactive to light. Conjunctivae and EOM are normal Nose: without d/c or deformity Neck: Neck supple. Gross normal ROM Cardiovascular: Normal rate and regular rhythm.   Pulmonary/Chest: Effort normal and breath sounds without rales or wheezing.  Neurological: Pt is alert. At baseline orientation, motor grossly intact Skin: Skin is warm. No rashes, other new lesions, no LE edema, trace dorsalis pedis bilat Psychiatric: Pt behavior is normal without agitation  No other exam findings Lab Results  Component Value Date   WBC 5.8 02/17/2017   HGB 12.0 (L) 02/17/2017   HCT 35.4 (L) 02/17/2017   PLT 175.0 02/17/2017   GLUCOSE 96 02/17/2017   CHOL 138 02/17/2017   TRIG 112.0 02/17/2017   HDL 46.40 02/17/2017   LDLDIRECT 134.8 04/29/2011   LDLCALC 69 02/17/2017   ALT 21 02/17/2017   AST 31 02/17/2017   NA 140 02/17/2017   K 3.4 (L) 02/17/2017   CL 100 02/17/2017   CREATININE 1.04 02/17/2017   BUN 15 02/17/2017   CO2 32 02/17/2017   TSH 3.88 02/17/2017   PSA 1.63 02/17/2017   HGBA1C 4.9 02/17/2017   MICROALBUR 0.7 07/25/2015       Assessment & Plan:

## 2017-09-21 NOTE — Assessment & Plan Note (Signed)
Wit recent low K liekly due to diuretic, for f/u lab today, cont same tx

## 2017-09-21 NOTE — Assessment & Plan Note (Signed)
I suspect more symptomatic recently, for f/u vascular studies, consdier vasc surgury referral though he is reluctant for now

## 2017-09-21 NOTE — Assessment & Plan Note (Signed)
stable overall by history and exam, recent data reviewed with pt, and pt to continue medical treatment as before,  to f/u any worsening symptoms or concerns, for f/u lab today 

## 2017-09-29 ENCOUNTER — Ambulatory Visit (HOSPITAL_COMMUNITY)
Admission: RE | Admit: 2017-09-29 | Discharge: 2017-09-29 | Disposition: A | Discharge status: Home / Self Care | Payer: 59 | Source: Ambulatory Visit | Attending: Vascular Surgery | Admitting: Vascular Surgery

## 2017-09-29 DIAGNOSIS — I739 Peripheral vascular disease, unspecified: Secondary | ICD-10-CM | POA: Diagnosis not present

## 2017-11-06 DIAGNOSIS — H4053X0 Glaucoma secondary to other eye disorders, bilateral, stage unspecified: Secondary | ICD-10-CM | POA: Diagnosis not present

## 2017-11-20 ENCOUNTER — Ambulatory Visit: Payer: 59 | Admitting: Nurse Practitioner

## 2017-12-08 DIAGNOSIS — G4733 Obstructive sleep apnea (adult) (pediatric): Secondary | ICD-10-CM | POA: Diagnosis not present

## 2018-01-07 ENCOUNTER — Other Ambulatory Visit: Payer: Self-pay | Admitting: Internal Medicine

## 2018-01-22 ENCOUNTER — Other Ambulatory Visit: Payer: Self-pay | Admitting: Internal Medicine

## 2018-01-22 DIAGNOSIS — L700 Acne vulgaris: Secondary | ICD-10-CM | POA: Diagnosis not present

## 2018-02-05 ENCOUNTER — Other Ambulatory Visit: Payer: Self-pay | Admitting: Internal Medicine

## 2018-02-13 ENCOUNTER — Other Ambulatory Visit: Payer: Self-pay | Admitting: Internal Medicine

## 2018-03-16 ENCOUNTER — Other Ambulatory Visit: Payer: Self-pay | Admitting: Internal Medicine

## 2018-03-30 ENCOUNTER — Ambulatory Visit: Payer: 59 | Admitting: Internal Medicine

## 2018-04-20 ENCOUNTER — Telehealth: Payer: Self-pay | Admitting: Internal Medicine

## 2018-04-20 MED ORDER — ROSUVASTATIN CALCIUM 10 MG PO TABS: 10.0000 mg | ORAL_TABLET | Freq: Every day | ORAL | 0 refills | Status: DC

## 2018-04-20 MED ORDER — PANTOPRAZOLE SODIUM 40 MG PO TBEC: 40.0000 mg | DELAYED_RELEASE_TABLET | Freq: Two times a day (BID) | ORAL | 2 refills | Status: DC

## 2018-04-20 NOTE — Telephone Encounter (Signed)
Pt called and requested a refill on his pantoprazole,in his chart it's showing  that he has 2 refills on this rx. I spoke with [facility] pharmacy pt was given a partial fill on 02/13/2018 and given another on 03/18/18  Patient currently needs an rx  For this meds.

## 2018-04-20 NOTE — Telephone Encounter (Signed)
Patient called requesting crestor and pantoprazole refill.  Pantoprazole should have refills at the pharmacy.  Left message informing patient to contact the pharmacy for his pantoprazole.  Please advise crestor refill.

## 2018-04-20 NOTE — Telephone Encounter (Signed)
Crestor refill have been sent to the pharmacy

## 2018-05-12 ENCOUNTER — Other Ambulatory Visit: Payer: Self-pay | Admitting: Internal Medicine

## 2018-05-21 ENCOUNTER — Ambulatory Visit: Payer: 59 | Admitting: Internal Medicine

## 2018-07-14 ENCOUNTER — Encounter: Payer: Self-pay | Admitting: Internal Medicine

## 2018-07-14 ENCOUNTER — Ambulatory Visit (INDEPENDENT_AMBULATORY_CARE_PROVIDER_SITE_OTHER): Payer: 59 | Admitting: Internal Medicine

## 2018-07-14 DIAGNOSIS — E611 Iron deficiency: Secondary | ICD-10-CM

## 2018-07-14 DIAGNOSIS — Z0000 Encounter for general adult medical examination without abnormal findings: Secondary | ICD-10-CM | POA: Diagnosis not present

## 2018-07-14 DIAGNOSIS — R7302 Impaired glucose tolerance (oral): Secondary | ICD-10-CM | POA: Diagnosis not present

## 2018-07-14 DIAGNOSIS — E538 Deficiency of other specified B group vitamins: Secondary | ICD-10-CM | POA: Diagnosis not present

## 2018-07-14 DIAGNOSIS — E559 Vitamin D deficiency, unspecified: Secondary | ICD-10-CM

## 2018-07-14 MED ORDER — PANTOPRAZOLE SODIUM 40 MG PO TBEC: 40.0000 mg | DELAYED_RELEASE_TABLET | Freq: Two times a day (BID) | ORAL | 3 refills | Status: DC

## 2018-07-14 MED ORDER — FENOFIBRATE 160 MG PO TABS: 160.0000 mg | ORAL_TABLET | Freq: Every day | ORAL | 3 refills | Status: DC

## 2018-07-14 MED ORDER — POTASSIUM CHLORIDE ER 10 MEQ PO CPCR: 10.0000 meq | ORAL_CAPSULE | Freq: Every day | ORAL | 3 refills | Status: DC

## 2018-07-14 MED ORDER — GABAPENTIN 300 MG PO CAPS: 300.0000 mg | ORAL_CAPSULE | Freq: Two times a day (BID) | ORAL | 1 refills | Status: DC

## 2018-07-14 MED ORDER — LISINOPRIL-HYDROCHLOROTHIAZIDE 20-12.5 MG PO TABS: 2.0000 | ORAL_TABLET | Freq: Every day | ORAL | 3 refills | Status: DC

## 2018-07-14 MED ORDER — TADALAFIL 20 MG PO TABS: 20.0000 mg | ORAL_TABLET | Freq: Every day | ORAL | 11 refills | Status: DC

## 2018-07-14 MED ORDER — ROSUVASTATIN CALCIUM 10 MG PO TABS: 10.0000 mg | ORAL_TABLET | Freq: Every day | ORAL | 3 refills | Status: DC

## 2018-07-14 NOTE — Progress Notes (Signed)
Patient ID: Tony Savage, male   DOB: 1955-08-21, 63 y.o.   MRN: [id number]  Virtual Visit via Video Note  I connected with Tony Savage on 07/14/18 at  8:20 AM EDT by a video enabled telemedicine application and verified that I am speaking with the correct person using two identifiers.  Location: Patient: at home Provider: at office   I discussed the limitations of evaluation and management by telemedicine and the availability of in person appointments. The patient expressed understanding and agreed to proceed.  History of Present Illness: Here for wellness and f/u;  Overall doing ok;  Pt denies Chest pain, worsening SOB, DOE, wheezing, orthopnea, PND, worsening LE edema, palpitations, dizziness or syncope.  Pt denies neurological change such as new headache, facial or extremity weakness.  Pt denies polydipsia, polyuria, or low sugar symptoms. Pt states overall good compliance with treatment and medications, good tolerability, and has been trying to follow appropriate diet.  Pt denies worsening depressive symptoms, suicidal ideation or panic. No fever, night sweats, wt loss, loss of appetite, or other constitutional symptoms.  Pt states good ability with ADL's, has low fall risk, home safety reviewed and adequate, no other significant changes in hearing or vision, and only occasionally active with exercise, usually doing a stationary bike, has gained wt from 265 to 275 with gym closed for the pandemic, now started with new bike.  Seems to help the chronic righ pain and   only takes the gabapentin twice per day as this helps withy "acting out in my sleep" per wife with movements and yelling at people not there; Pt continues to have recurring LBP without change in severity, bowel or bladder change, fever, wt loss,  worsening LE pain/numbness/weakness, gait change or falls.  BP at home usually 120/60 on average.  Past Medical History:  Diagnosis Date  . ABSCESS, GLUTEAL 06/14/2009  . Acute  gouty arthropathy 10/18/2008  . Anal fissure   . BACK PAIN 01/16/2010  . Diverticulosis   . ERECTILE DYSFUNCTION 12/31/2006  . FEVER UNSPECIFIED 06/08/2007  . GERD 09/29/2006  . GLUCOSE INTOLERANCE 12/31/2006  . HEMORRHOIDS 09/29/2006  . HYPERLIPIDEMIA 09/29/2006  . HYPERSOMNIA 04/30/2009  . HYPERTENSION 09/29/2006  . Impaired fasting glucose 01/01/2007  . KNEE PAIN, LEFT 10/18/2008  . LOW BACK PAIN 12/31/2006  . OBESITY 09/29/2006  . Sleep apnea    cpap  . SYNCOPE, VASOVAGAL 03/14/2008  . VERTEBRAL FRACTURE 09/29/2006   Past Surgical History:  Procedure Laterality Date  . COLONOSCOPY    . drainage of recurrent perirectal abceses    . fistula-in-ano repair    . HEMORRHOID SURGERY      reports that he quit smoking about 21 years ago. He has never used smokeless tobacco. He reports current alcohol use of about 14.0 standard drinks of alcohol per week. He reports that he does not use drugs. family history includes Allergies in his sister; Colon cancer (age of onset: 48) in his father; Colon polyps in his brother; Diabetes in his mother; Heart disease in his father; Prostate cancer in his father. No Known Allergies Current Outpatient Medications on File Prior to Visit  Medication Sig Dispense Refill  . aspirin 81 MG EC tablet Take 81 mg by mouth daily.      [name] Kitchen CIALIS 20 MG tablet TAKE 1 TABLET EVERY DAY 5 tablet 0  . fenofibrate 160 MG tablet TAKE 1 TABLET BY MOUTH ONCE DAILY 90 tablet 1  . gabapentin (NEURONTIN) 300 MG capsule TAKE 1 CAPSULE  BY MOUTH THREE TIMES A DAY*TO START AFTER THE LOWER DOSE FOR 1 WEEK 90 capsule 1  . lisinopril-hydrochlorothiazide (PRINZIDE,ZESTORETIC) 20-12.5 MG tablet TAKE 2 TABLETS BY MOUTH EVERY DAY 60 tablet 2  . pantoprazole (PROTONIX) 40 MG tablet Take 1 tablet (40 mg total) by mouth 2 (two) times daily. 60 tablet 2  . potassium chloride (MICRO-K) 10 MEQ CR capsule TAKE 1 CAPSULE BY MOUTH DAILY 90 capsule 1  . rosuvastatin (CRESTOR) 10 MG tablet Take 1 tablet (10 mg  total) by mouth daily. 90 tablet 0  . Vitamin D, Ergocalciferol, (DRISDOL) 50000 units CAPS capsule Take 1 capsule (50,000 Units total) by mouth every 7 (seven) days. 12 capsule 0   No current facility-administered medications on file prior to visit.    Observations/Objective: Alert, NAD, appropriate mood and affect, resps normal, cn 2-12 intact, moves all 4s, no visible rash or swelling Lab Results  Component Value Date   WBC 6.4 09/21/2017   HGB 12.2 (L) 09/21/2017   HCT 35.8 (L) 09/21/2017   PLT 199.0 09/21/2017   GLUCOSE 107 (H) 09/21/2017   CHOL 137 09/21/2017   TRIG 105.0 09/21/2017   HDL 46.70 09/21/2017   LDLDIRECT 134.8 04/29/2011   LDLCALC 69 09/21/2017   ALT 21 02/17/2017   AST 31 02/17/2017   NA 140 09/21/2017   K 3.5 09/21/2017   CL 101 09/21/2017   CREATININE 1.28 09/21/2017   BUN 18 09/21/2017   CO2 29 09/21/2017   TSH 3.88 02/17/2017   PSA 1.63 02/17/2017   HGBA1C 5.1 09/21/2017   MICROALBUR 0.7 07/25/2015   Assessment and Plan: See notes  Follow Up Instructions: See notes   I discussed the assessment and treatment plan with the patient. The patient was provided an opportunity to ask questions and all were answered. The patient agreed with the plan and demonstrated an understanding of the instructions.   The patient was advised to call back or seek an in-person evaluation if the symptoms worsen or if the condition fails to improve as anticipated.   Cathlean Cower, MD

## 2018-07-14 NOTE — Patient Instructions (Signed)
Please continue all other medications as before, and refills have been done if requested.  Please have the pharmacy call with any other refills you may need.  Please continue your efforts at being more active, low cholesterol diet, and weight control.  You are otherwise up to date with prevention measures today.  Please keep your appointments with your specialists as you may have planned  Please go to the LAB in the Basement (turn left off the elevator) for the tests to be done tomorrow as you mentioned  You will be contacted by phone if any changes need to be made immediately.  Otherwise, you will receive a letter about your results with an explanation, but please check with MyChart first.  Please remember to sign up for MyChart if you have not done so, as this will be important to you in the future with finding out test results, communicating by private email, and scheduling acute appointments online when needed.  Please return in 6 months, or sooner if needed, with Lab testing done 3-5 days before

## 2018-07-14 NOTE — Assessment & Plan Note (Signed)
stable overall by history and exam, recent data reviewed with pt, and pt to continue medical treatment as before,  to f/u any worsening symptoms or concerns  

## 2018-07-14 NOTE — Assessment & Plan Note (Signed)

## 2018-07-15 ENCOUNTER — Other Ambulatory Visit: Payer: Self-pay | Admitting: Internal Medicine

## 2018-07-15 ENCOUNTER — Other Ambulatory Visit (INDEPENDENT_AMBULATORY_CARE_PROVIDER_SITE_OTHER): Payer: 59

## 2018-07-15 DIAGNOSIS — R7302 Impaired glucose tolerance (oral): Secondary | ICD-10-CM

## 2018-07-15 DIAGNOSIS — E559 Vitamin D deficiency, unspecified: Secondary | ICD-10-CM | POA: Diagnosis not present

## 2018-07-15 DIAGNOSIS — Z0000 Encounter for general adult medical examination without abnormal findings: Secondary | ICD-10-CM

## 2018-07-15 DIAGNOSIS — E538 Deficiency of other specified B group vitamins: Secondary | ICD-10-CM | POA: Diagnosis not present

## 2018-07-15 DIAGNOSIS — E611 Iron deficiency: Secondary | ICD-10-CM

## 2018-07-15 DIAGNOSIS — Z1159 Encounter for screening for other viral diseases: Secondary | ICD-10-CM

## 2018-07-15 LAB — LIPID PANEL
Cholesterol: 139 mg/dL (ref 0–200)
HDL: 51.8 mg/dL (ref >39.00)
LDL Cholesterol: 69 mg/dL (ref 0–99)
NonHDL: 87.1
Total CHOL/HDL Ratio: 3
Triglycerides: 89 mg/dL (ref 0.0–149.0)
VLDL: 17.8 mg/dL (ref 0.0–40.0)

## 2018-07-15 LAB — CBC WITH DIFFERENTIAL/PLATELET
Basophils Absolute: 0 10*3/uL (ref 0.0–0.1)
Basophils Relative: 0.6 % (ref 0.0–3.0)
Eosinophils Absolute: 0.2 10*3/uL (ref 0.0–0.7)
Eosinophils Relative: 3.1 % (ref 0.0–5.0)
HCT: 37.4 % — ABNORMAL LOW (ref 39.0–52.0)
Hemoglobin: 12.6 g/dL — ABNORMAL LOW (ref 13.0–17.0)
Lymphocytes Relative: 31.1 % (ref 12.0–46.0)
Lymphs Abs: 2 10*3/uL (ref 0.7–4.0)
MCHC: 33.7 g/dL (ref 30.0–36.0)
MCV: 100 fl (ref 78.0–100.0)
Monocytes Absolute: 0.6 10*3/uL (ref 0.1–1.0)
Monocytes Relative: 10.2 % (ref 3.0–12.0)
Neutro Abs: 3.5 10*3/uL (ref 1.4–7.7)
Neutrophils Relative %: 55 % (ref 43.0–77.0)
Platelets: 197 10*3/uL (ref 150.0–400.0)
RBC: 3.74 Mil/uL — ABNORMAL LOW (ref 4.22–5.81)
RDW: 13.2 % (ref 11.5–15.5)
WBC: 6.3 10*3/uL (ref 4.0–10.5)

## 2018-07-15 LAB — IBC PANEL
Iron: 115 ug/dL (ref 42–165)
Saturation Ratios: 27.4 % (ref 20.0–50.0)
Transferrin: 300 mg/dL (ref 212.0–360.0)

## 2018-07-15 LAB — URINALYSIS, ROUTINE W REFLEX MICROSCOPIC
Bilirubin Urine: NEGATIVE
Hgb urine dipstick: NEGATIVE
Ketones, ur: NEGATIVE
Leukocytes,Ua: NEGATIVE
Nitrite: NEGATIVE
RBC / HPF: NONE SEEN (ref 0–?)
Specific Gravity, Urine: 1.02 (ref 1.000–1.030)
Total Protein, Urine: NEGATIVE
Urine Glucose: NEGATIVE
Urobilinogen, UA: 2 — AB (ref 0.0–1.0)
pH: 7 (ref 5.0–8.0)

## 2018-07-15 LAB — HEPATIC FUNCTION PANEL
ALT: 21 U/L (ref 0–53)
AST: 32 U/L (ref 0–37)
Albumin: 4.5 g/dL (ref 3.5–5.2)
Alkaline Phosphatase: 77 U/L (ref 39–117)
Bilirubin, Direct: 0.2 mg/dL (ref 0.0–0.3)
Total Bilirubin: 0.9 mg/dL (ref 0.2–1.2)
Total Protein: 7 g/dL (ref 6.0–8.3)

## 2018-07-15 LAB — TSH: TSH: 3.14 u[IU]/mL (ref 0.35–4.50)

## 2018-07-15 LAB — BASIC METABOLIC PANEL
BUN: 17 mg/dL (ref 6–23)
CO2: 25 mEq/L (ref 19–32)
Calcium: 9.9 mg/dL (ref 8.4–10.5)
Chloride: 103 mEq/L (ref 96–112)
Creatinine, Ser: 1.23 mg/dL (ref 0.40–1.50)
GFR: 72.35 mL/min (ref >60.00)
Glucose, Bld: 96 mg/dL (ref 70–99)
Potassium: 3.7 mEq/L (ref 3.5–5.1)
Sodium: 140 mEq/L (ref 135–145)

## 2018-07-15 LAB — VITAMIN D 25 HYDROXY (VIT D DEFICIENCY, FRACTURES): VITD: 23.73 ng/mL — ABNORMAL LOW (ref 30.00–100.00)

## 2018-07-15 LAB — HEMOGLOBIN A1C: Hgb A1c MFr Bld: 5 % (ref 4.6–6.5)

## 2018-07-15 LAB — VITAMIN B12: Vitamin B-12: 329 pg/mL (ref 211–911)

## 2018-07-15 MED ORDER — VITAMIN D (ERGOCALCIFEROL) 1.25 MG (50000 UNIT) PO CAPS: 50000.0000 [IU] | ORAL_CAPSULE | ORAL | 0 refills | Status: DC

## 2018-07-15 NOTE — Addendum Note (Signed)
Addended by: Boris Lown B on: 07/15/2018 07:35 AM   Modules accepted: Orders

## 2018-07-16 ENCOUNTER — Telehealth: Payer: Self-pay

## 2018-07-16 LAB — HEPATITIS C ANTIBODY
Hepatitis C Ab: NONREACTIVE
SIGNAL TO CUT-OFF: 0.01 (ref <1.00)

## 2018-07-16 NOTE — Telephone Encounter (Signed)
Pt has viewed results via MyChart  

## 2018-07-16 NOTE — Telephone Encounter (Signed)
-----   Message from Biagio Borg, MD sent at 07/15/2018  8:03 PM EDT ----- Left message on MyChart, pt to cont same tx except  The test results show that your current treatment is OK, as the tests are stable except the Vitamin D level is moderately low. Please take Vitamin D 50000 units weekly for 12 weeks, then plan to change to OTC Vitamin D3 at 2000 units per day, indefinitely.Tony Savage to please inform pt, I will do rx

## 2018-07-21 ENCOUNTER — Telehealth: Payer: Self-pay | Admitting: Internal Medicine

## 2018-07-21 NOTE — Telephone Encounter (Signed)
Pt"s Gout has started to flair up and he wanted to know if Dr. Jenny Reichmann can send something to the pharmacy. Pt hasn't needed any gout medication for a few years so he is not aware of the name of the past Rx / please advise

## 2018-07-22 MED ORDER — PREDNISONE 10 MG PO TABS: ORAL_TABLET | ORAL | 0 refills | Status: DC

## 2018-07-22 NOTE — Addendum Note (Signed)
Addended by: Biagio Borg on: 07/22/2018 12:20 PM   Modules accepted: Orders

## 2018-07-22 NOTE — Telephone Encounter (Signed)
[facility] for short course prednisone - done erx

## 2018-08-06 ENCOUNTER — Other Ambulatory Visit: Payer: Self-pay | Admitting: Internal Medicine

## 2018-10-28 IMAGING — CR DG BE W/ AIR HIGH DENSITY
15 series · 15 of 15 positions shown · non-contrast
Comparison: None.

CLINICAL DATA: 61-year-old male with history of incomplete
colonoscopy. Diverticulosis.

EXAM:
AIR CONTRAST BARIUM ENEMA
TECHNIQUE: Initial scout AP supine abdominal image obtained to insure adequate
colon cleansing. Barium was introduced into the colon in a
retrograde fashion and refluxed from the rectum to the distal
transverse colon. As much of the barium as possible was then removed
through the indwelling tube via gravity drain. Air was then
insufflated into the colon. Spot images of the colon followed by
overhead radiographs were obtained.
FLUOROSCOPY TIME:  Fluoroscopy Time:  7.7 minutes
Radiation Exposure Index (if provided by the fluoroscopic device):
270 mGy

[t abdomen supine (1 of 2)]
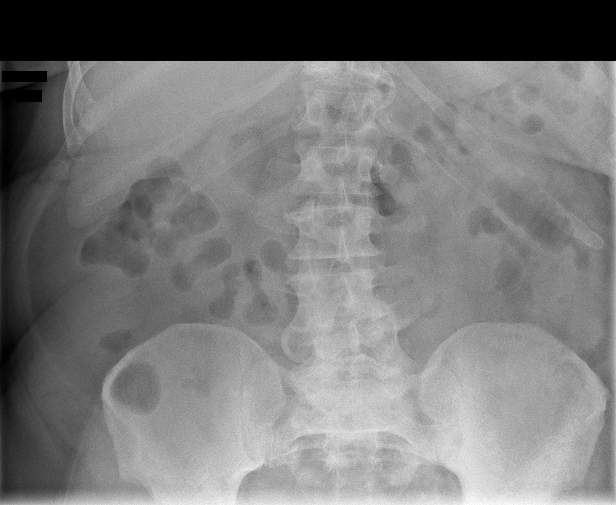

[t abdomen supine (2 of 2)]
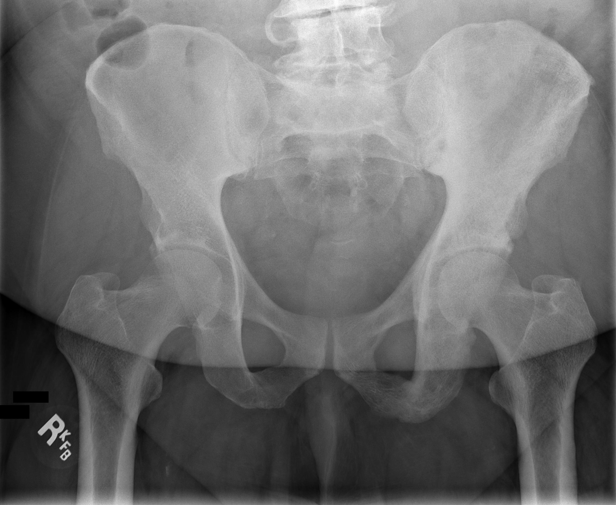

[fluoro_barium singleshot_bw (1 of 11)]
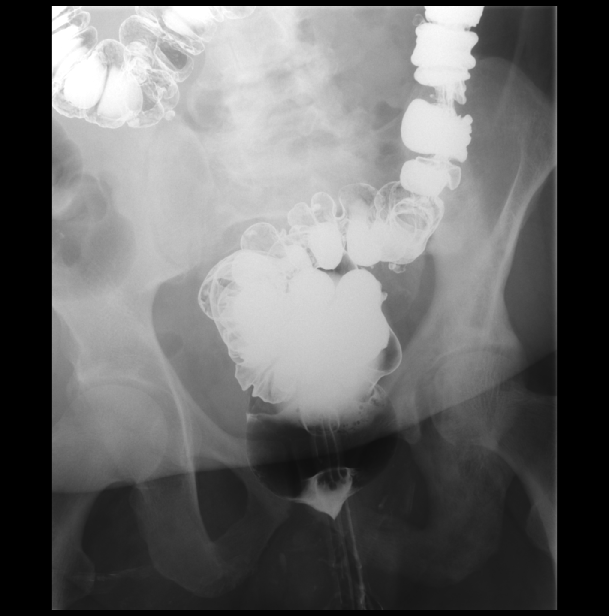

[fluoro_barium singleshot_bw (2 of 11)]
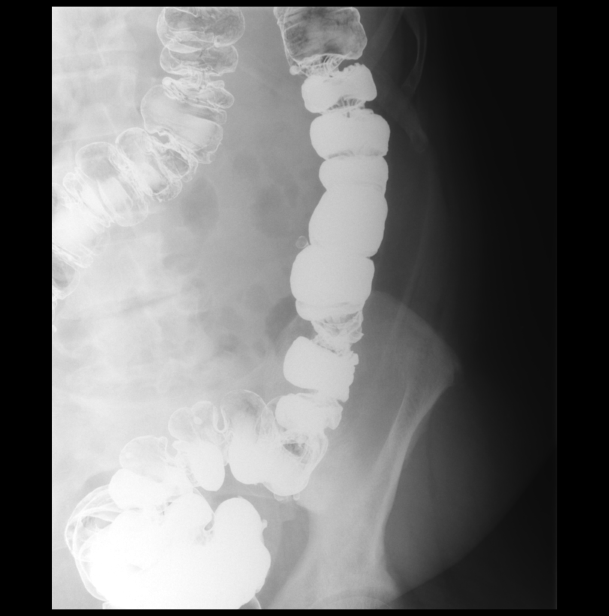

[fluoro_barium singleshot_bw (3 of 11)]
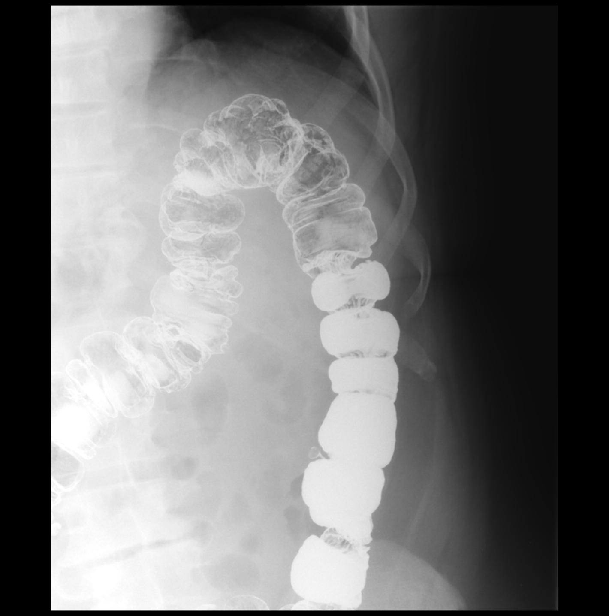

[fluoro_barium singleshot_bw (4 of 11)]
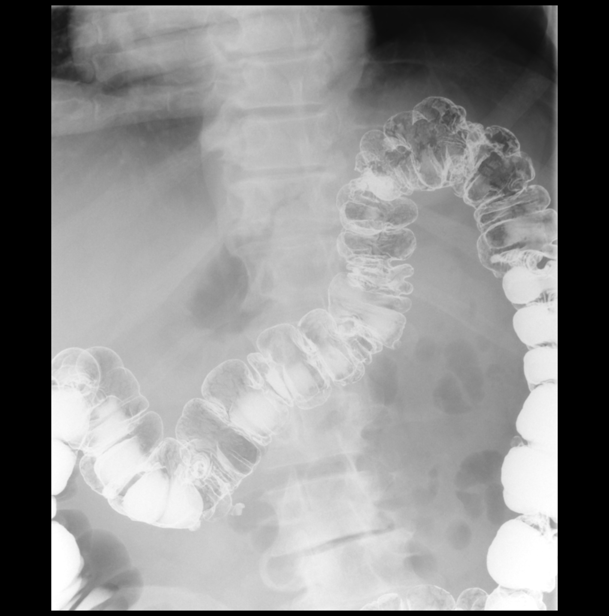

[fluoro_barium singleshot_bw (5 of 11)]
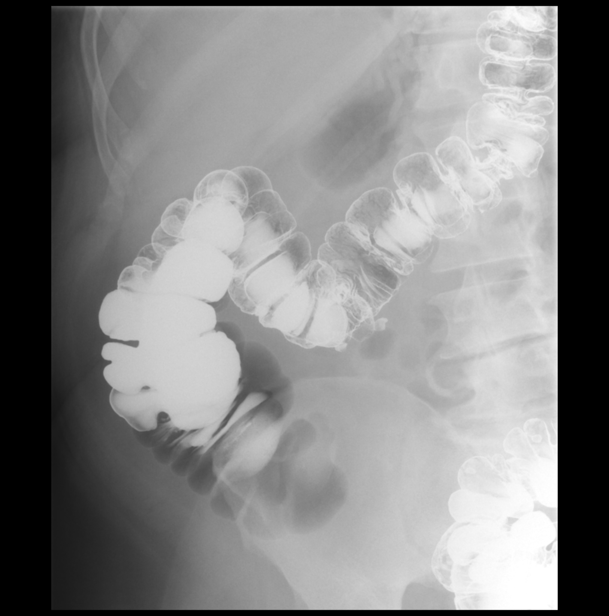

[fluoro_barium singleshot_bw (6 of 11)]
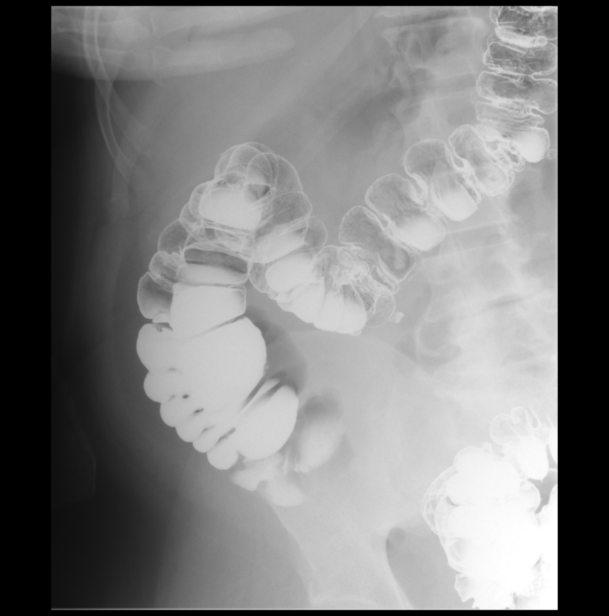

[fluoro_barium singleshot_bw (7 of 11)]
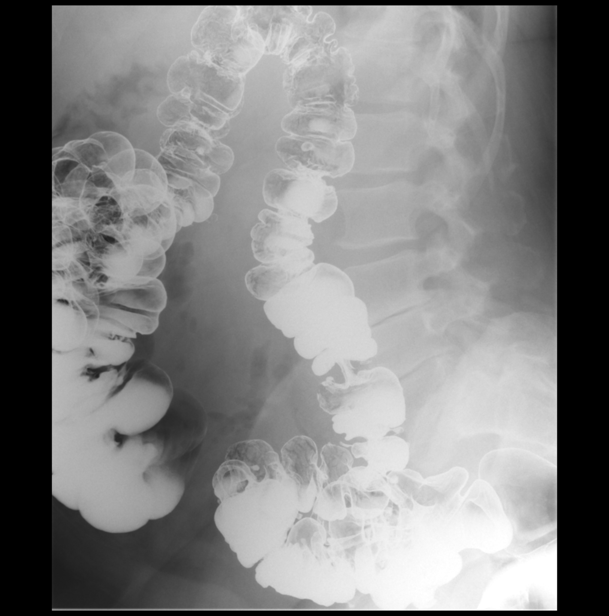

[fluoro_barium singleshot_bw (8 of 11)]
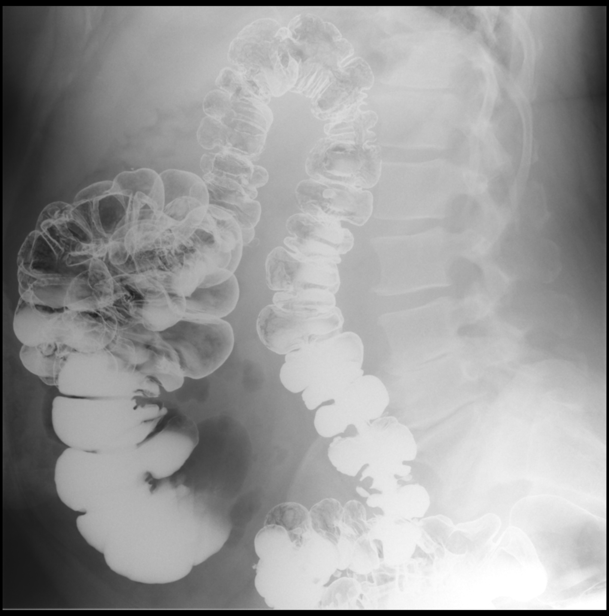

[fluoro_barium singleshot_bw (9 of 11)]
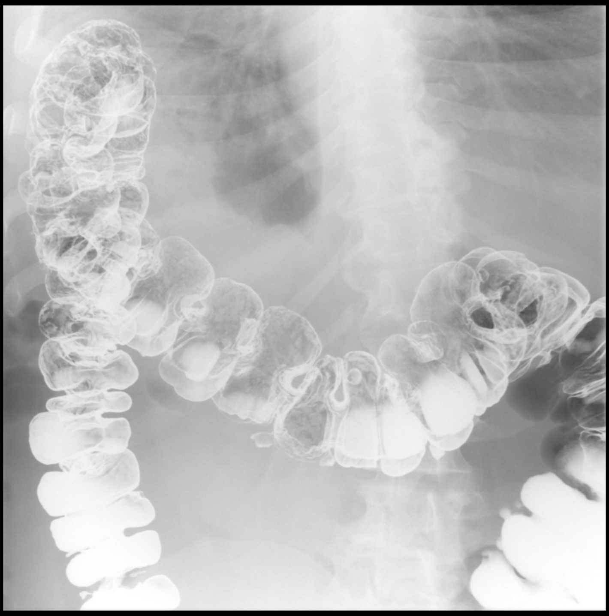

[fluoro_barium singleshot_bw (10 of 11)]
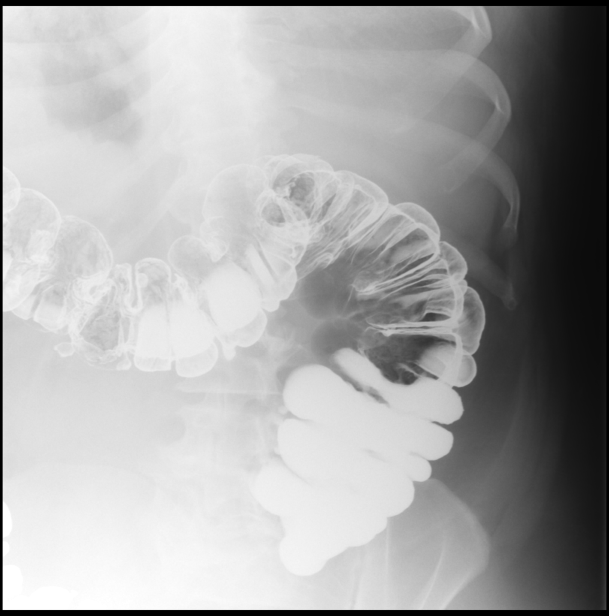

[fluoro_barium singleshot_bw (11 of 11)]
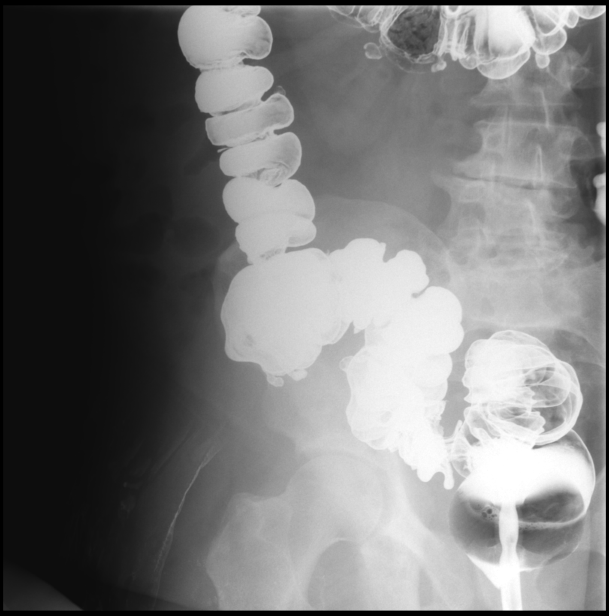

[t abdomen barium (1 of 2)]
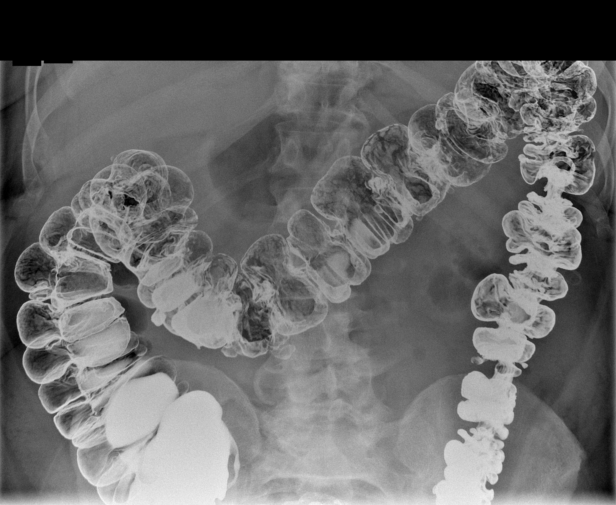

[t abdomen barium (2 of 2)]
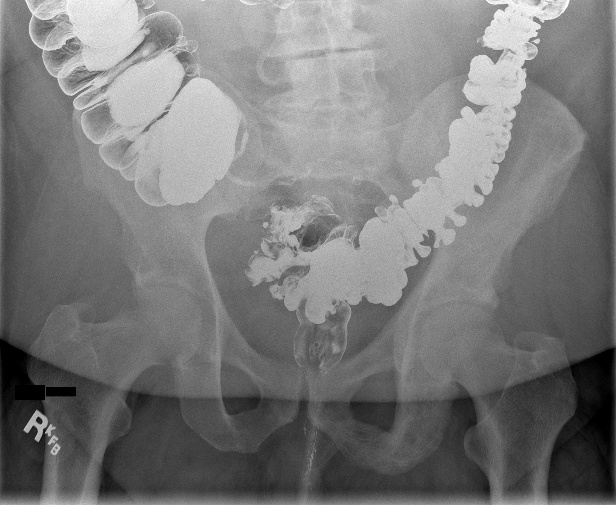

[15 of 15 positions shown; findings below may reference images not displayed]

FINDINGS: Preprocedure KUB demonstrates a nonobstructive bowel gas pattern,
and no pneumoperitoneum.

Double-contrast barium enema demonstrates numerous colonic
diverticulae. No definite colonic mass. Contrast was refluxed to the
level of the cecum and into the appendix.
IMPRESSION: 1. Moderate colonic diverticulosis.

## 2019-01-05 ENCOUNTER — Other Ambulatory Visit (INDEPENDENT_AMBULATORY_CARE_PROVIDER_SITE_OTHER): Payer: 59

## 2019-01-05 DIAGNOSIS — R7302 Impaired glucose tolerance (oral): Secondary | ICD-10-CM

## 2019-01-05 LAB — BASIC METABOLIC PANEL
BUN: 18 mg/dL (ref 6–23)
CO2: 28 mEq/L (ref 19–32)
Calcium: 9.8 mg/dL (ref 8.4–10.5)
Chloride: 102 mEq/L (ref 96–112)
Creatinine, Ser: 1.32 mg/dL (ref 0.40–1.50)
GFR: 66.58 mL/min (ref >60.00)
Glucose, Bld: 102 mg/dL — ABNORMAL HIGH (ref 70–99)
Potassium: 3.4 mEq/L — ABNORMAL LOW (ref 3.5–5.1)
Sodium: 140 mEq/L (ref 135–145)

## 2019-01-05 LAB — HEPATIC FUNCTION PANEL
ALT: 21 U/L (ref 0–53)
AST: 28 U/L (ref 0–37)
Albumin: 4.4 g/dL (ref 3.5–5.2)
Alkaline Phosphatase: 82 U/L (ref 39–117)
Bilirubin, Direct: 0.2 mg/dL (ref 0.0–0.3)
Total Bilirubin: 1 mg/dL (ref 0.2–1.2)
Total Protein: 7.3 g/dL (ref 6.0–8.3)

## 2019-01-05 LAB — LIPID PANEL
Cholesterol: 152 mg/dL (ref 0–200)
HDL: 51.2 mg/dL (ref >39.00)
LDL Cholesterol: 83 mg/dL (ref 0–99)
NonHDL: 100.42
Total CHOL/HDL Ratio: 3
Triglycerides: 87 mg/dL (ref 0.0–149.0)
VLDL: 17.4 mg/dL (ref 0.0–40.0)

## 2019-01-05 LAB — HEMOGLOBIN A1C: Hgb A1c MFr Bld: 4.9 % (ref 4.6–6.5)

## 2019-01-11 ENCOUNTER — Ambulatory Visit (INDEPENDENT_AMBULATORY_CARE_PROVIDER_SITE_OTHER): Payer: 59 | Admitting: Internal Medicine

## 2019-01-11 DIAGNOSIS — E559 Vitamin D deficiency, unspecified: Secondary | ICD-10-CM

## 2019-01-11 DIAGNOSIS — G8929 Other chronic pain: Secondary | ICD-10-CM

## 2019-01-11 DIAGNOSIS — I10 Essential (primary) hypertension: Secondary | ICD-10-CM | POA: Diagnosis not present

## 2019-01-11 DIAGNOSIS — E538 Deficiency of other specified B group vitamins: Secondary | ICD-10-CM

## 2019-01-11 DIAGNOSIS — E785 Hyperlipidemia, unspecified: Secondary | ICD-10-CM

## 2019-01-11 DIAGNOSIS — Z0000 Encounter for general adult medical examination without abnormal findings: Secondary | ICD-10-CM

## 2019-01-11 DIAGNOSIS — R7302 Impaired glucose tolerance (oral): Secondary | ICD-10-CM | POA: Diagnosis not present

## 2019-01-11 DIAGNOSIS — E611 Iron deficiency: Secondary | ICD-10-CM

## 2019-01-11 DIAGNOSIS — G4733 Obstructive sleep apnea (adult) (pediatric): Secondary | ICD-10-CM | POA: Diagnosis not present

## 2019-01-11 DIAGNOSIS — M5441 Lumbago with sciatica, right side: Secondary | ICD-10-CM

## 2019-01-11 MED ORDER — GABAPENTIN 300 MG PO CAPS: 300.0000 mg | ORAL_CAPSULE | Freq: Three times a day (TID) | ORAL | 1 refills | Status: DC

## 2019-01-11 NOTE — Patient Instructions (Signed)
Ok to increase the gabapentin to 300 mg three times per day  You will be contacted regarding the referral for: pulmonary for sleep apnea  Please continue all other medications as before, and refills have been done if requested.  Please have the pharmacy call with any other refills you may need.  Please continue your efforts at being more active, low cholesterol diet, and weight control.  Please keep your appointments with your specialists as you may have planned  Please return in 6 months, or sooner if needed, with Lab testing done 3-5 days before

## 2019-01-11 NOTE — Progress Notes (Signed)
Patient ID: Tony Savage, male   DOB: Nov 15, 1955, 63 y.o.   MRN: [id number]  Virtual Visit via Video Note  I connected with Tony Savage on 01/11/19 at  8:20 AM EST by a video enabled telemedicine application and verified that I am speaking with the correct person using two identifiers.  Location: Patient: at home Provider: at office   I discussed the limitations of evaluation and management by telemedicine and the availability of in person appointments. The patient expressed understanding and agreed to proceed.  History of Present Illness: Here to f/u; overall doing ok,  Pt denies chest pain, increasing sob or doe, wheezing, orthopnea, PND, increased LE swelling, palpitations, dizziness or syncope.  Pt denies new neurological symptoms such as new headache, or facial or extremity weakness or numbness.  Pt denies polydipsia, polyuria, or low sugar episode.  Pt states overall good compliance with meds, mostly trying to follow appropriate diet, with wt overall stable,  but little exercise however.   BP at home 120/60 at home this am  Pt continues to have recurring LBP without change in severity, bowel or bladder change, fever, wt loss,  worsening LE pain/numbness/weakness, gait change or falls, except chronic pain persists with recent mild worsening RLE radaition. Past Medical History:  Diagnosis Date  . ABSCESS, GLUTEAL 06/14/2009  . 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 Anal fissure   . BACK PAIN 01/16/2010  . Diverticulosis   . ERECTILE DYSFUNCTION 12/31/2006  . FEVER UNSPECIFIED 06/08/2007  . GERD 09/29/2006  . GLUCOSE INTOLERANCE 12/31/2006  . HEMORRHOIDS 09/29/2006  . HYPERLIPIDEMIA 09/29/2006  . HYPERSOMNIA 04/30/2009  . HYPERTENSION 09/29/2006  . Impaired fasting glucose 01/01/2007  . KNEE PAIN, LEFT 10/18/2008  . LOW BACK PAIN 12/31/2006  . OBESITY 09/29/2006  . Sleep apnea    cpap  . SYNCOPE, VASOVAGAL 03/14/2008  . VERTEBRAL FRACTURE 09/29/2006   Past Surgical History:  Procedure  Laterality Date  . COLONOSCOPY    . drainage of recurrent perirectal abceses    . fistula-in-ano repair    . HEMORRHOID SURGERY      reports that he quit smoking about 21 years ago. He has never used smokeless tobacco. He reports current alcohol use of about 14.0 standard drinks of alcohol per week. He reports that he does not use drugs. family history includes Allergies in his sister; Colon cancer (age of onset: 72) in his father; Colon polyps in his brother; Diabetes in his mother; Heart disease in his father; Prostate cancer in his father. No Known Allergies Current Outpatient Medications on File Prior to Visit  Medication Sig Dispense Refill  . aspirin 81 MG EC tablet Take 81 mg by mouth daily.      . fenofibrate 160 MG tablet Take 1 tablet (160 mg total) by mouth daily. 90 tablet 3  . lisinopril-hydrochlorothiazide (ZESTORETIC) 20-12.5 MG tablet TAKE 2 TABLETS BY MOUTH EVERY DAY 60 tablet 5  . pantoprazole (PROTONIX) 40 MG tablet Take 1 tablet (40 mg total) by mouth 2 (two) times daily. 180 tablet 3  . potassium chloride (MICRO-K) 10 MEQ CR capsule Take 1 capsule (10 mEq total) by mouth daily. 90 capsule 3  . predniSONE (DELTASONE) 10 MG tablet 2 tabs by mouth per day for 5 days 10 tablet 0  . rosuvastatin (CRESTOR) 10 MG tablet Take 1 tablet (10 mg total) by mouth daily. 90 tablet 3  . tadalafil (CIALIS) 20 MG tablet Take 1 tablet (20 mg total) by mouth daily. 5 tablet 11  .  Vitamin D, Ergocalciferol, (DRISDOL) 1.25 MG (50000 UT) CAPS capsule Take 1 capsule (50,000 Units total) by mouth every 7 (seven) days. 12 capsule 0   No current facility-administered medications on file prior to visit.    Observations/Objective: Alert, NAD, appropriate mood and affect, resps normal, cn 2-12 intact, moves all 4s, no visible rash or swelling  Lab Results  Component Value Date   WBC 6.3 07/15/2018   HGB 12.6 (L) 07/15/2018   HCT 37.4 (L) 07/15/2018   PLT 197.0 07/15/2018   GLUCOSE 102 (H)  01/05/2019   CHOL 152 01/05/2019   TRIG 87.0 01/05/2019   HDL 51.20 01/05/2019   LDLDIRECT 134.8 04/29/2011   LDLCALC 83 01/05/2019   ALT 21 01/05/2019   AST 28 01/05/2019   NA 140 01/05/2019   K 3.4 (L) 01/05/2019   CL 102 01/05/2019   CREATININE 1.32 01/05/2019   BUN 18 01/05/2019   CO2 28 01/05/2019   TSH 3.14 07/15/2018   PSA 1.63 02/17/2017   HGBA1C 4.9 01/05/2019   MICROALBUR 0.7 07/25/2015    Assessment and Plan: See notes  Follow Up Instructions: Seen notes   I discussed the assessment and treatment plan with the patient. The patient was provided an opportunity to ask questions and all were answered. The patient agreed with the plan and demonstrated an understanding of the instructions.   The patient was advised to call back or seek an in-person evaluation if the symptoms worsen or if the condition fails to improve as anticipated   Cathlean Cower, MD

## 2019-01-15 ENCOUNTER — Encounter: Payer: Self-pay | Admitting: Internal Medicine

## 2019-01-15 NOTE — Assessment & Plan Note (Signed)
Due for f/u, refer pulmonary, cont same tx

## 2019-01-15 NOTE — Assessment & Plan Note (Signed)
stable overall by history and exam, recent data reviewed with pt, and pt to continue medical treatment as before,  to f/u any worsening symptoms or concerns, for better diet

## 2019-01-15 NOTE — Assessment & Plan Note (Signed)
stable overall by history and exam, recent data reviewed with pt, and pt to continue medical treatment as before,  to f/u any worsening symptoms or concerns  

## 2019-01-15 NOTE — Assessment & Plan Note (Signed)
Chronic persistent with recent flare - for increased gabapentin 300 tid,  to f/u any worsening symptoms or concerns

## 2019-04-03 ENCOUNTER — Ambulatory Visit: Payer: Self-pay | Attending: Internal Medicine

## 2019-04-03 ENCOUNTER — Ambulatory Visit: Payer: 59

## 2019-04-03 DIAGNOSIS — Z23 Encounter for immunization: Secondary | ICD-10-CM | POA: Insufficient documentation

## 2019-04-03 NOTE — Progress Notes (Signed)
   Covid-19 Vaccination Clinic  Name:  Tony Savage    MRN: [id number] DOB: 1955/12/24  04/03/2019  [name] was observed post Covid-19 immunization for 15 minutes without incident. He was provided with Vaccine Information Sheet and instruction to access the V-Safe system.   [name] was instructed to call 911 with any severe reactions post vaccine: [name] Kitchen Difficulty breathing  . Swelling of face and throat  . A fast heartbeat  . A bad rash all over body  . Dizziness and weakness   Immunizations Administered    Name Date Dose VIS Date Route   Pfizer COVID-19 Vaccine 04/03/2019 12:55 AM 0.3 mL 01/07/2019 Intramuscular   Manufacturer: [place]   Lot: [id number]   [place]: [id number]

## 2019-05-03 ENCOUNTER — Ambulatory Visit: Payer: 59 | Attending: Internal Medicine

## 2019-05-03 DIAGNOSIS — Z23 Encounter for immunization: Secondary | ICD-10-CM

## 2019-05-03 NOTE — Progress Notes (Signed)
   Covid-19 Vaccination Clinic  Name:  Tony Savage    MRN: [id number] DOB: 03/22/1955  05/03/2019  [name] was observed post Covid-19 immunization for 15 minutes without incident. He was provided with Vaccine Information Sheet and instruction to access the V-Safe system.   [name] was instructed to call 911 with any severe reactions post vaccine: [name] Kitchen Difficulty breathing  . Swelling of face and throat  . A fast heartbeat  . A bad rash all over body  . Dizziness and weakness   Immunizations Administered    Name Date Dose VIS Date Route   Pfizer COVID-19 Vaccine 05/03/2019 11:53 AM 0.3 mL 01/07/2019 Intramuscular   Manufacturer: [place]   Lot: [id number]   [place]: [id number]

## 2019-05-16 ENCOUNTER — Other Ambulatory Visit: Payer: Self-pay

## 2019-05-16 ENCOUNTER — Encounter: Payer: Self-pay | Admitting: Pulmonary Disease

## 2019-05-16 ENCOUNTER — Ambulatory Visit: Payer: 59 | Admitting: Pulmonary Disease

## 2019-05-16 VITALS — BP 94/58 | HR 54 | Temp 97.3°F | Ht 72.0 in | Wt 307.4 lb

## 2019-05-16 DIAGNOSIS — G4733 Obstructive sleep apnea (adult) (pediatric): Secondary | ICD-10-CM

## 2019-05-16 NOTE — Progress Notes (Signed)
Tony Savage    [id number]    04/13/1955  Primary Care Physician:Tony Savage, Tony Oris, MD  Referring Physician: Biagio Borg, MD [address] [place],  [place] [address]  Chief complaint:   In for obstructive sleep apnea  HPI: Diagnosed with obstructive sleep apnea in 2011 Has been using CPAP on a regular basis  Sleep is not as restorative Occasional snoring through the machine  Uses CPAP nightly Weight has fluctuated  No dryness of his mouth in the mornings, no headaches He has some drooling No chest pains or chest discomfort Tries to be active Weight has fluctuated from 360 down to 269 and back up to 307  No pertinent occupational history Reformed smoker, quit over 20 years ago  Usually goes to bed about 9:30 PM, takes him about 30 minutes to fall asleep About 3 awakenings Final awakening time about 5:45 AM  Outpatient Encounter Medications as of 05/16/2019  Medication Sig  . aspirin 81 MG EC tablet Take 81 mg by mouth daily.    . fenofibrate 160 MG tablet Take 1 tablet (160 mg total) by mouth daily.  [name] Kitchen gabapentin (NEURONTIN) 300 MG capsule Take 1 capsule (300 mg total) by mouth 3 (three) times daily.  [name] Kitchen lisinopril-hydrochlorothiazide (ZESTORETIC) 20-12.5 MG tablet TAKE 2 TABLETS BY MOUTH EVERY DAY  . pantoprazole (PROTONIX) 40 MG tablet Take 1 tablet (40 mg total) by mouth 2 (two) times daily.  . potassium chloride (MICRO-K) 10 MEQ CR capsule Take 1 capsule (10 mEq total) by mouth daily.  . predniSONE (DELTASONE) 10 MG tablet 2 tabs by mouth per day for 5 days  . rosuvastatin (CRESTOR) 10 MG tablet Take 1 tablet (10 mg total) by mouth daily.  . tadalafil (CIALIS) 20 MG tablet Take 1 tablet (20 mg total) by mouth daily.  . Vitamin D, Ergocalciferol, (DRISDOL) 1.25 MG (50000 UT) CAPS capsule Take 1 capsule (50,000 Units total) by mouth every 7 (seven) days.   No facility-administered encounter medications on file as of 05/16/2019.    Allergies as of  05/16/2019  . (No Known Allergies)    Past Medical History:  Diagnosis Date  . ABSCESS, GLUTEAL 06/14/2009  . 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 Anal fissure   . BACK PAIN 01/16/2010  . Diverticulosis   . ERECTILE DYSFUNCTION 12/31/2006  . FEVER UNSPECIFIED 06/08/2007  . GERD 09/29/2006  . GLUCOSE INTOLERANCE 12/31/2006  . HEMORRHOIDS 09/29/2006  . HYPERLIPIDEMIA 09/29/2006  . HYPERSOMNIA 04/30/2009  . HYPERTENSION 09/29/2006  . Impaired fasting glucose 01/01/2007  . KNEE PAIN, LEFT 10/18/2008  . LOW BACK PAIN 12/31/2006  . OBESITY 09/29/2006  . Sleep apnea    cpap  . SYNCOPE, VASOVAGAL 03/14/2008  . VERTEBRAL FRACTURE 09/29/2006    Past Surgical History:  Procedure Laterality Date  . COLONOSCOPY    . drainage of recurrent perirectal abceses    . fistula-in-ano repair    . HEMORRHOID SURGERY      Family History  Problem Relation Age of Onset  . Heart disease Father        CHF  . Prostate cancer Father        went from prostate to colon  . Colon cancer Father 63  . Allergies Sister   . Colon polyps Brother   . Diabetes Mother   . Esophageal cancer Neg Hx   . Stomach cancer Neg Hx   . Rectal cancer Neg Hx     Social History  Socioeconomic History  . Marital status: Married    Spouse name: Not on file  . Number of children: 1  . Years of education: Not on file  . Highest education level: Not on file  Occupational History  . Occupation: Sales executive: UNEMPLOYED  Tobacco Use  . Smoking status: Former Smoker    Packs/day: 1.50    Years: 25.00    Pack years: 37.50    Types: Cigarettes    Quit date: 03/19/1997    Years since quitting: 22.1  . Smokeless tobacco: Never Used  . Tobacco comment: 1 and 1/2 ppd for 15 years, Quit 1999  Substance and Sexual Activity  . Alcohol use: Yes    Alcohol/week: 14.0 standard drinks    Types: 14 Cans of beer per week  . Drug use: No  . Sexual activity: Not on file  Other Topics Concern  . Not on file    Social History Narrative  . Not on file   Social Determinants of Health   Financial Resource Strain:   . Difficulty of Paying Living Expenses:   Food Insecurity:   . Worried About Charity fundraiser in the Last Year:   . Arboriculturist in the Last Year:   Transportation Needs:   . Film/video editor (Medical):   [name] Kitchen Lack of Transportation (Non-Medical):   Physical Activity:   . Days of Exercise per Week:   . Minutes of Exercise per Session:   Stress:   . Feeling of Stress :   Social Connections:   . Frequency of Communication with Friends and Family:   . Frequency of Social Gatherings with Friends and Family:   . Attends Religious Services:   . Active Member of Clubs or Organizations:   . Attends Archivist Meetings:   [name] Kitchen Marital Status:   Intimate Partner Violence:   . Fear of Current or Ex-Partner:   . Emotionally Abused:   [name] Kitchen Physically Abused:   . Sexually Abused:     Review of Systems  HENT: Negative.   Respiratory: Positive for apnea.   Cardiovascular: Negative.   Gastrointestinal: Negative.   Psychiatric/Behavioral: Positive for sleep disturbance.    Vitals:   05/16/19 0933  BP: (!) 94/58  Pulse: (!) 54  Temp: (!) 97.3 F (36.3 C)  SpO2: 98%     Physical Exam  Constitutional: He appears well-developed and well-nourished.  HENT:  Head: Normocephalic and atraumatic.  Eyes: Pupils are equal, round, and reactive to light. Conjunctivae are normal. Right eye exhibits no discharge.  Neck: No tracheal deviation present. No thyromegaly present.  Cardiovascular: Normal rate and regular rhythm.  Pulmonary/Chest: Effort normal and breath sounds normal. No respiratory distress. He has no wheezes. He has no rales. He exhibits no tenderness.  Musculoskeletal:        General: No edema. Normal range of motion.     Cervical back: Normal range of motion and neck supple.  Neurological: He is alert.  Skin: Skin is warm and dry. No erythema.  Psychiatric:  He has a normal mood and affect.   Results of the Epworth flowsheet 05/16/2019 06/12/2016  Sitting and reading 3 0  Watching TV 1 1  Sitting, inactive in a public place (e.g. a theatre or a meeting) 0 0  As a passenger in a car for an hour without a break 2 0  Lying down to rest in the afternoon when circumstances permit 3 1  Sitting  and talking to someone 0 0  Sitting quietly after a lunch without alcohol 3 0  In a car, while stopped for a few minutes in traffic 0 0  Total score 12 2    Data Reviewed: Compliance data shows 100% compliance with CPAP use, average hours of 8 hours and 35 Machine set between 10 and 20 auto titrating AHI of 0.3  Previous record reviewed with AHI of 79 noted Assessment:  Severe obstructive sleep apnea -Excellent compliance with CPAP use -Not deriving as significant benefit -Sleep not as restorative  Machine may currently be dysfunctional/dated  Severe obesity -Continues to work on weight loss -Spends about 30 minutes on an elliptical regularly  Pathophysiology of sleep disordered breathing discussed Treatment options discussed Importance of weight control discussed Behavioral modifications including elevation of the head of the bed, lateral position sleeping discussed  Plan/Recommendations: We will obtain a home sleep study to reconfirm presence of severe obstructive sleep apnea Encouraged to continue weight loss efforts Risk with not adequately treating obstructive sleep apnea was discussed  We will initiate auto titrating CPAP following evaluation of this sleep study  I will see him back in about 3 months and if stable, yearly follow-up  Sherrilyn Rist MD [facility] 05/16/2019, 9:53 AM  CC: Biagio Borg, MD

## 2019-05-16 NOTE — Patient Instructions (Signed)
History of obstructive sleep apnea Dated machine  Very good compliance with machine appearing to be effective  We will obtain a home sleep study-to qualify for new machine  Continue regular activities Continue weight loss efforts  I will see you back in 3 months Call with significant concerns

## 2019-05-23 ENCOUNTER — Telehealth: Payer: Self-pay | Admitting: Pulmonary Disease

## 2019-05-23 DIAGNOSIS — G4733 Obstructive sleep apnea (adult) (pediatric): Secondary | ICD-10-CM

## 2019-05-23 NOTE — Telephone Encounter (Signed)
I will call the patient once I get the read machine to work properly IT should be coming today

## 2019-05-24 NOTE — Telephone Encounter (Signed)
Patient has been resc for thursday

## 2019-05-26 ENCOUNTER — Ambulatory Visit: Payer: 59

## 2019-05-26 ENCOUNTER — Other Ambulatory Visit: Payer: Self-pay

## 2019-05-26 DIAGNOSIS — G4733 Obstructive sleep apnea (adult) (pediatric): Secondary | ICD-10-CM

## 2019-05-30 DIAGNOSIS — G4733 Obstructive sleep apnea (adult) (pediatric): Secondary | ICD-10-CM

## 2019-05-31 ENCOUNTER — Telehealth: Payer: Self-pay | Admitting: Pulmonary Disease

## 2019-05-31 DIAGNOSIS — G4733 Obstructive sleep apnea (adult) (pediatric): Secondary | ICD-10-CM

## 2019-05-31 NOTE — Telephone Encounter (Signed)
Call patient   Sleep study result  Date of study: 05/26/2019   Impression: Moderate obstructive sleep apnea Mild oxygen desaturations Prior study was significant for severe obstructive sleep apnea  Recommendation: Recommend CPAP therapy for moderate obstructive sleep apnea Auto titrating CPAP with pressure settings of 5-20 will be appropriate  Follow-up as scheduled

## 2019-05-31 NOTE — Telephone Encounter (Signed)
Dr. Ander Slade has reviewed the home sleep test, this showed:  Moderate obstructive sleep apnea  Mild oxygen desaturations  Past study was significant for severe obstructive sleep apnea.  Recommendations   Recommend CPAP therapy for moderate obstructive sleep apnea.  Treatment options are CPAP with the settings auto titrating to 5-20 cm H2O.   Close clinical follow up with compliance monitoring to optimize therapeutic efficiency.  Weight loss measures encouraged.  Advise against driving while sleepy & against medication with sedative side effects.    Patient telephoned and identification verified. Results of home sleep study reviewed. Patient is willing to start CPAP, order placed.

## 2019-06-15 ENCOUNTER — Telehealth: Payer: Self-pay | Admitting: Pulmonary Disease

## 2019-06-15 NOTE — Telephone Encounter (Signed)
Order was placed 05/31/19.  Called and spoke with pt who states he has not heard anything from [facility] about CPAP. Stated to pt that we would check with [facility] for status update and then call him back as soon as we could. Pt verbalized understanding.  PCCs, please advise.

## 2019-06-15 NOTE — Telephone Encounter (Signed)
Sent a high piority messge to [facility] order was sent there on 5/4 Joellen Jersey

## 2019-06-17 ENCOUNTER — Telehealth: Payer: Self-pay | Admitting: Pulmonary Disease

## 2019-06-17 NOTE — Telephone Encounter (Signed)
Has anything been sent back from [facility] yet on this? thanks

## 2019-06-17 NOTE — Telephone Encounter (Signed)
I called spoke to the patient

## 2019-06-17 NOTE — Telephone Encounter (Signed)
Tony Savage, Christal  Joellen Jersey  Order received       Previous Messages   ----- Message -----  From: Joellen Jersey  Sent: 06/15/2019  3:35 PM EDT  To: Rex Kras, Loreen Freud, Christal Goff   Pt calling about order we sent on 05/31/19 did you get it    I will call the patient and make them aware left a message for the patient

## 2019-06-17 NOTE — Telephone Encounter (Signed)
Spoke to the patient he has spoke to [facility] they are processing the order and should contact him in a few days

## 2019-07-09 ENCOUNTER — Other Ambulatory Visit: Payer: Self-pay | Admitting: Internal Medicine

## 2019-07-09 NOTE — Telephone Encounter (Signed)
Please refill as per office routine med refill policy (all routine meds refilled for 3 mo or monthly per pt preference up to one year from last visit, then month to month grace period for 3 mo, then further med refills will have to be denied)  

## 2019-07-15 ENCOUNTER — Encounter: Payer: 59 | Admitting: Internal Medicine

## 2019-07-19 ENCOUNTER — Other Ambulatory Visit (INDEPENDENT_AMBULATORY_CARE_PROVIDER_SITE_OTHER): Payer: 59

## 2019-07-19 DIAGNOSIS — E559 Vitamin D deficiency, unspecified: Secondary | ICD-10-CM | POA: Diagnosis not present

## 2019-07-19 DIAGNOSIS — E538 Deficiency of other specified B group vitamins: Secondary | ICD-10-CM | POA: Diagnosis not present

## 2019-07-19 DIAGNOSIS — R7302 Impaired glucose tolerance (oral): Secondary | ICD-10-CM

## 2019-07-19 DIAGNOSIS — Z0000 Encounter for general adult medical examination without abnormal findings: Secondary | ICD-10-CM | POA: Diagnosis not present

## 2019-07-19 DIAGNOSIS — E611 Iron deficiency: Secondary | ICD-10-CM | POA: Diagnosis not present

## 2019-07-19 LAB — LIPID PANEL
Cholesterol: 137 mg/dL (ref 0–200)
HDL: 45.8 mg/dL (ref >39.00)
LDL Cholesterol: 68 mg/dL (ref 0–99)
NonHDL: 90.78
Total CHOL/HDL Ratio: 3
Triglycerides: 112 mg/dL (ref 0.0–149.0)
VLDL: 22.4 mg/dL (ref 0.0–40.0)

## 2019-07-19 LAB — HEPATIC FUNCTION PANEL
ALT: 19 U/L (ref 0–53)
AST: 24 U/L (ref 0–37)
Albumin: 4.3 g/dL (ref 3.5–5.2)
Alkaline Phosphatase: 79 U/L (ref 39–117)
Bilirubin, Direct: 0.2 mg/dL (ref 0.0–0.3)
Total Bilirubin: 0.8 mg/dL (ref 0.2–1.2)
Total Protein: 6.4 g/dL (ref 6.0–8.3)

## 2019-07-19 LAB — CBC WITH DIFFERENTIAL/PLATELET
Basophils Absolute: 0 10*3/uL (ref 0.0–0.1)
Basophils Relative: 0.7 % (ref 0.0–3.0)
Eosinophils Absolute: 0.3 10*3/uL (ref 0.0–0.7)
Eosinophils Relative: 4.4 % (ref 0.0–5.0)
HCT: 34.5 % — ABNORMAL LOW (ref 39.0–52.0)
Hemoglobin: 11.7 g/dL — ABNORMAL LOW (ref 13.0–17.0)
Lymphocytes Relative: 32.9 % (ref 12.0–46.0)
Lymphs Abs: 2 10*3/uL (ref 0.7–4.0)
MCHC: 34 g/dL (ref 30.0–36.0)
MCV: 99.4 fl (ref 78.0–100.0)
Monocytes Absolute: 0.6 10*3/uL (ref 0.1–1.0)
Monocytes Relative: 10 % (ref 3.0–12.0)
Neutro Abs: 3.2 10*3/uL (ref 1.4–7.7)
Neutrophils Relative %: 52 % (ref 43.0–77.0)
Platelets: 184 10*3/uL (ref 150.0–400.0)
RBC: 3.47 Mil/uL — ABNORMAL LOW (ref 4.22–5.81)
RDW: 13 % (ref 11.5–15.5)
WBC: 6.2 10*3/uL (ref 4.0–10.5)

## 2019-07-19 LAB — URINALYSIS, ROUTINE W REFLEX MICROSCOPIC
Bilirubin Urine: NEGATIVE
Hgb urine dipstick: NEGATIVE
Ketones, ur: NEGATIVE
Leukocytes,Ua: NEGATIVE
Nitrite: NEGATIVE
RBC / HPF: NONE SEEN (ref 0–?)
Specific Gravity, Urine: 1.02 (ref 1.000–1.030)
Total Protein, Urine: NEGATIVE
Urine Glucose: NEGATIVE
Urobilinogen, UA: 2 — AB (ref 0.0–1.0)
WBC, UA: NONE SEEN (ref 0–?)
pH: 6.5 (ref 5.0–8.0)

## 2019-07-19 LAB — BASIC METABOLIC PANEL
BUN: 22 mg/dL (ref 6–23)
CO2: 28 mEq/L (ref 19–32)
Calcium: 9.4 mg/dL (ref 8.4–10.5)
Chloride: 103 mEq/L (ref 96–112)
Creatinine, Ser: 1.4 mg/dL (ref 0.40–1.50)
GFR: 62.1 mL/min (ref >60.00)
Glucose, Bld: 100 mg/dL — ABNORMAL HIGH (ref 70–99)
Potassium: 4 mEq/L (ref 3.5–5.1)
Sodium: 140 mEq/L (ref 135–145)

## 2019-07-19 LAB — TSH: TSH: 3.79 u[IU]/mL (ref 0.35–4.50)

## 2019-07-19 LAB — IBC PANEL
Iron: 143 ug/dL (ref 42–165)
Saturation Ratios: 33.7 % (ref 20.0–50.0)
Transferrin: 303 mg/dL (ref 212.0–360.0)

## 2019-07-19 LAB — HEMOGLOBIN A1C: Hgb A1c MFr Bld: 5.1 % (ref 4.6–6.5)

## 2019-07-19 LAB — PSA: PSA: 1.78 ng/mL (ref 0.10–4.00)

## 2019-07-19 LAB — VITAMIN B12: Vitamin B-12: 265 pg/mL (ref 211–911)

## 2019-07-19 LAB — VITAMIN D 25 HYDROXY (VIT D DEFICIENCY, FRACTURES): VITD: 54.35 ng/mL (ref 30.00–100.00)

## 2019-07-22 ENCOUNTER — Other Ambulatory Visit: Payer: Self-pay

## 2019-07-22 ENCOUNTER — Ambulatory Visit (INDEPENDENT_AMBULATORY_CARE_PROVIDER_SITE_OTHER): Payer: 59 | Admitting: Internal Medicine

## 2019-07-22 ENCOUNTER — Encounter: Payer: Self-pay | Admitting: Internal Medicine

## 2019-07-22 VITALS — BP 128/76 | HR 60 | Temp 98.1°F | Ht 72.0 in | Wt 307.0 lb

## 2019-07-22 DIAGNOSIS — E785 Hyperlipidemia, unspecified: Secondary | ICD-10-CM

## 2019-07-22 DIAGNOSIS — M5416 Radiculopathy, lumbar region: Secondary | ICD-10-CM

## 2019-07-22 DIAGNOSIS — M7122 Synovial cyst of popliteal space [Baker], left knee: Secondary | ICD-10-CM | POA: Insufficient documentation

## 2019-07-22 DIAGNOSIS — I10 Essential (primary) hypertension: Secondary | ICD-10-CM

## 2019-07-22 DIAGNOSIS — Z0001 Encounter for general adult medical examination with abnormal findings: Secondary | ICD-10-CM

## 2019-07-22 DIAGNOSIS — R7302 Impaired glucose tolerance (oral): Secondary | ICD-10-CM | POA: Diagnosis not present

## 2019-07-22 DIAGNOSIS — Z23 Encounter for immunization: Secondary | ICD-10-CM | POA: Diagnosis not present

## 2019-07-22 NOTE — Patient Instructions (Addendum)
You had the Tdap tetanus shot today  Please continue all other medications as before, and refills have been done if requested.  Please have the pharmacy call with any other refills you may need.  Please continue your efforts at being more active, low cholesterol diet, and weight control.  You are otherwise up to date with prevention measures today.  Please keep your appointments with your specialists as you may have planned  You will be contacted regarding the referral for: Sports Medicine for the left knee cyst  Please make an Appointment to return in 6 months, or sooner if needed, also with Lab Appointment for testing done 3-5 days before at the [facility] (so this is for TWO appointments - please see the scheduling desk as you leave)

## 2019-07-22 NOTE — Progress Notes (Signed)
Subjective:    Patient ID: Tony Savage, male    DOB: 1955/09/01, 64 y.o.   MRN: [id number]  HPI  Here for wellness and f/u;  Overall doing ok;  Pt denies Chest pain, worsening SOB, DOE, wheezing, orthopnea, PND, worsening LE edema, palpitations, dizziness or syncope.  Pt denies neurological change such as new headache, facial or extremity weakness.  Pt denies polydipsia, polyuria, or low sugar symptoms. Pt states overall good compliance with treatment and medications, good tolerability, and has been trying to follow appropriate diet.  Pt denies worsening depressive symptoms, suicidal ideation or panic. No fever, night sweats, wt loss, loss of appetite, or other constitutional symptoms.  Pt states good ability with ADL's, has low fall risk, home safety reviewed and adequate, no other significant changes in hearing or vision, and only occasionally active with exercise. Also with post left knee knot with pain reprodicible so that he knows exactly how far he can walk the dog before too much pain. Also has mild worsening right lumbar radiculopathy symptoms with recurrent proximal and occasional distal RLE pain, weakness, numbness without giveaways or falls. Past Medical History:  Diagnosis Date  . ABSCESS, GLUTEAL 06/14/2009  . 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 Anal fissure   . BACK PAIN 01/16/2010  . Diverticulosis   . ERECTILE DYSFUNCTION 12/31/2006  . FEVER UNSPECIFIED 06/08/2007  . GERD 09/29/2006  . GLUCOSE INTOLERANCE 12/31/2006  . HEMORRHOIDS 09/29/2006  . HYPERLIPIDEMIA 09/29/2006  . HYPERSOMNIA 04/30/2009  . HYPERTENSION 09/29/2006  . Impaired fasting glucose 01/01/2007  . KNEE PAIN, LEFT 10/18/2008  . LOW BACK PAIN 12/31/2006  . OBESITY 09/29/2006  . Sleep apnea    cpap  . SYNCOPE, VASOVAGAL 03/14/2008  . VERTEBRAL FRACTURE 09/29/2006   Past Surgical History:  Procedure Laterality Date  . COLONOSCOPY    . drainage of recurrent perirectal abceses    . fistula-in-ano repair    .  HEMORRHOID SURGERY      reports that he quit smoking about 22 years ago. His smoking use included cigarettes. He has a 37.50 pack-year smoking history. He has never used smokeless tobacco. He reports current alcohol use of about 14.0 standard drinks of alcohol per week. He reports that he does not use drugs. family history includes Allergies in his sister; Colon cancer (age of onset: 107) in his father; Colon polyps in his brother; Diabetes in his mother; Heart disease in his father; Prostate cancer in his father. No Known Allergies Current Outpatient Medications on File Prior to Visit  Medication Sig Dispense Refill  . aspirin 81 MG EC tablet Take 81 mg by mouth daily.      . fenofibrate 160 MG tablet Take 1 tablet (160 mg total) by mouth daily. Annual appt w/labs is due must see provider for future refills 30 tablet 0  . gabapentin (NEURONTIN) 300 MG capsule Take 1 capsule (300 mg total) by mouth 3 (three) times daily. 270 capsule 1  . lisinopril-hydrochlorothiazide (ZESTORETIC) 20-12.5 MG tablet TAKE 2 TABLETS BY MOUTH EVERY DAY 60 tablet 5  . pantoprazole (PROTONIX) 40 MG tablet TAKE 1 TABLET(40 MG) BY MOUTH TWICE DAILY 180 tablet 3  . potassium chloride (MICRO-K) 10 MEQ CR capsule Take 1 capsule (10 mEq total) by mouth daily. 90 capsule 3  . rosuvastatin (CRESTOR) 10 MG tablet Take 1 tablet (10 mg total) by mouth daily. Annual appt w/labs is due must see provider for future refills 30 tablet 0  . Vitamin D, Ergocalciferol, (DRISDOL) 1.25 MG (  50000 UT) CAPS capsule Take 1 capsule (50,000 Units total) by mouth every 7 (seven) days. 12 capsule 0   No current facility-administered medications on file prior to visit.   Review of Systems All otherwise neg per pt    Objective:   Physical Exam BP 128/76 (BP Location: Left Arm, Patient Position: Sitting, Cuff Size: Large)   Pulse 60   Temp 98.1 F (36.7 C) (Oral)   Ht 6' (1.829 m)   Wt (!) 307 lb (139.3 kg)   SpO2 97%   BMI 41.64 kg/m  VS  noted,  Constitutional: Pt appears in NAD HENT: Head: NCAT.  Right Ear: External ear normal.  Left Ear: External ear normal.  Eyes: . Pupils are equal, round, and reactive to light. Conjunctivae and EOM are normal Nose: without d/c or deformity Neck: Neck supple. Gross normal ROM Cardiovascular: Normal rate and regular rhythm.   Pulmonary/Chest: Effort normal and breath sounds without rales or wheezing.  Abd:  Soft, NT, ND, + BS, no organomegaly Left knee with mod egg sized post synovial cyst like structuve mod tender Neurological: Pt is alert. At baseline orientation, motor grossly intact Skin: Skin is warm. No rashes, other new lesions, no LE edema Psychiatric: Pt behavior is normal without agitation  All otherwise neg per pt Lab Results  Component Value Date   WBC 6.2 07/19/2019   HGB 11.7 (L) 07/19/2019   HCT 34.5 (L) 07/19/2019   PLT 184.0 07/19/2019   GLUCOSE 100 (H) 07/19/2019   CHOL 137 07/19/2019   TRIG 112.0 07/19/2019   HDL 45.80 07/19/2019   LDLDIRECT 134.8 04/29/2011   LDLCALC 68 07/19/2019   ALT 19 07/19/2019   AST 24 07/19/2019   NA 140 07/19/2019   K 4.0 07/19/2019   CL 103 07/19/2019   CREATININE 1.40 07/19/2019   BUN 22 07/19/2019   CO2 28 07/19/2019   TSH 3.79 07/19/2019   PSA 1.78 07/19/2019   HGBA1C 5.1 07/19/2019   MICROALBUR 0.7 07/25/2015      Assessment & Plan:

## 2019-07-23 ENCOUNTER — Encounter: Payer: Self-pay | Admitting: Internal Medicine

## 2019-07-23 NOTE — Assessment & Plan Note (Signed)
stable overall by history and exam, recent data reviewed with pt, and pt to continue medical treatment as before,  to f/u any worsening symptoms or concerns  

## 2019-07-23 NOTE — Assessment & Plan Note (Addendum)
increassingly symptomatic, for pain control, also refer to sport med  I spent 31 minutes in addition to time for CPX wellness examination in preparing to see the patient by review of recent labs, imaging and procedures, obtaining and reviewing separately obtained history, communicating with the patient and family or caregiver, ordering medications, tests or procedures, and documenting clinical information in the EHR including the differential Dx, treatment, and any further evaluation and other management of left knee pain synovical cyst, right lumbar radiculopathy, hyperglycemia, htn, hld

## 2019-07-23 NOTE — Assessment & Plan Note (Signed)

## 2019-07-23 NOTE — Assessment & Plan Note (Signed)
With mild worsening frequency and severity, but declines f/u MRI or surgical referral

## 2019-07-26 ENCOUNTER — Ambulatory Visit: Payer: Self-pay

## 2019-07-26 ENCOUNTER — Other Ambulatory Visit: Payer: Self-pay

## 2019-07-26 ENCOUNTER — Ambulatory Visit: Payer: 59 | Admitting: Family Medicine

## 2019-07-26 ENCOUNTER — Encounter: Payer: Self-pay | Admitting: Family Medicine

## 2019-07-26 VITALS — BP 120/72 | HR 61 | Ht 72.0 in | Wt 309.4 lb

## 2019-07-26 DIAGNOSIS — M25562 Pain in left knee: Secondary | ICD-10-CM

## 2019-07-26 DIAGNOSIS — M4317 Spondylolisthesis, lumbosacral region: Secondary | ICD-10-CM

## 2019-07-26 NOTE — Progress Notes (Signed)
Subjective:    I'm seeing this patient as a consultation for:  Dr. Jenny Reichmann. Note will be routed back to referring provider/PCP.  CC: L knee pain  I, Molly Weber, LAT, ATC, am serving as scribe for Dr. Lynne Leader.  HPI: Pt is a 64 y/o male presenting w/ c/o L post knee swelling.   X 2 weeks w/ no known MOI.  Swelling is located the posterior lateral knee.  He notes it does not bother him.  No significant pain.  Not particularly worse with any activity.  He denies any leg swelling.  He denies locking catching or giving way.  Has not tried any treatments yet.  He also notes persistent pain radiating down his right leg.  He has a known history of spondylolisthesis impacting L5 nerve root on the right.  He has not had much treatment for this.  He denies any physical therapy or epidural steroid injections.  He notes pain is bad enough that it is interfering with his ability to walk normally.  He can only walk about 1 block before he starts having pain.  Pain is typically relieved by sitting.  Past medical history, Surgical history, Family history, Social history, Allergies, and medications have been entered into the medical record, reviewed.   Review of Systems: No new headache, visual changes, nausea, vomiting, diarrhea, constipation, dizziness, abdominal pain, skin rash, fevers, chills, night sweats, weight loss, swollen lymph nodes, body aches, joint swelling, muscle aches, chest pain, shortness of breath, mood changes, visual or auditory hallucinations.   Objective:    Vitals:   07/26/19 0750  BP: 120/72  Pulse: 61  SpO2: 96%   General: Well Developed, well nourished, and in no acute distress.  Neuro/Psych: Alert and oriented x3, extra-ocular muscles intact, able to move all 4 extremities, sensation grossly intact. Skin: Warm and dry, no rashes noted.  Respiratory: Not using accessory muscles, speaking in full sentences, trachea midline.  Cardiovascular: Pulses palpable, no extremity  edema. Abdomen: Does not appear distended. MSK:  Left knee mild fullness posterior knee otherwise normal-appearing without significant effusion. Range of motion 0-120 degrees with minimal crepitation. Nontender. Stable ligamentous exam.  L-spine normal motion of flexion limited extension. Lower extremity strength reflexes and sensation are intact distally bilateral lower extremities.  Lab and Radiology Results  Diagnostic Limited MSK Ultrasound of: Left knee Quad tendon intact normal-appearing Trace joint effusion superior patellar space. Patellar tendon is intact with slight hyperechoic change at distal patellar tendon insertion. Medial joint line narrowed degenerative appearing Lateral joint line mildly narrowed mildly degenerative appearing. Posterior knee no Baker's cyst. Area of fullness is musculature posterior knee. Posterior knee veins are compressible. Impression: DJD.  No Baker's cyst.   EXAM: LUMBAR SPINE - COMPLETE 4+ VIEW  COMPARISON:  MRI 06/11/2011  FINDINGS: Lumbar spine numbered as per prior MRI. Lumbar spine scoliosis concave left. Diffuse multilevel degenerative change. Stable grade 1 spondylolisthesis L5-S1 .  IMPRESSION: Stable L5-S1 grade 1 spondylolisthesis. Lumbar spine scoliosis concave left with diffuse multilevel degenerative change again noted. No acute abnormality identified. Similar findings noted on prior exam.   Electronically Signed   By: [place]   On: 09/22/2016 09:34 I, Lynne Leader, personally (independently) visualized and performed the interpretation of the images attached in this note.   Impression and Recommendations:    Assessment and Plan: 64 y.o. male with left posterior knee fullness.  No Baker's cyst.  DVT is also quite unlikely given absence of leg swelling.  Discussed options.  Plan for watchful waiting with patient.  Could proceed with further work-up if needed.  More pertinent issue today is his right  leg pain and numbness.  This occurs with standing and walking and is very likely due to known spondylolisthesis.  Plan for trial of physical therapy to work on core stabilization.  Recheck back in 6 weeks if not better likely will proceed with repeat lumbar MRI to further evaluate cause of pain and for epidural steroid injection planning. Claudication possible but less likely..   Orders Placed This Encounter  Procedures  . [place] LIMITED JOINT SPACE STRUCTURES LOW LEFT(NO LINKED CHARGES)    Order Specific Question:   Reason for Exam (SYMPTOM  OR DIAGNOSIS REQUIRED)    Answer:   L knee pain    Order Specific Question:   Preferred imaging location?    Answer:   [place]  . Ambulatory referral to Physical Therapy    Referral Priority:   Routine    Referral Type:   Physical Medicine    Referral Reason:   Specialty Services Required    Requested Specialty:   Physical Therapy   No orders of the defined types were placed in this encounter.   Discussed warning signs or symptoms. Please see discharge instructions. Patient expresses understanding.   The above documentation has been reviewed and is accurate and complete Lynne Leader, M.D.

## 2019-07-26 NOTE — Patient Instructions (Signed)
Thank you for coming in today. Plan for PT for your back.  Your knee is not a cyst.  Ok to keep an eye on the knee and proceed to more workup if worsening.  Recheck in 6 weeks after trying some PT.    Spondylolisthesis Rehab Ask your health care provider which exercises are safe for you. Do exercises exactly as told by your health care provider and adjust them as directed. It is normal to feel mild stretching, pulling, tightness, or discomfort as you do these exercises. Stop right away if you feel sudden pain or your pain gets worse. Do not begin these exercises until told by your health care provider. Stretching and range-of-motion exercises These exercises warm up your muscles and joints and improve the movement and flexibility of your hips and back. These exercises may also help to relieve pain, numbness, and tingling. Single knee to chest  1. Lie on your back on a firm surface with both legs straight. 2. Bend one of your knees. Use your hands to move your knee up toward your chest until you feel a gentle stretch in your lower back and buttock. ? Hold your leg in this position by holding on to the front of your knee. ? Keep your other leg as straight as possible. 3. Hold for __________ seconds. 4. Slowly return to the starting position. 5. Repeat this exercise with your other leg. Repeat __________ times. Complete this exercise __________ times a day. Double knee to chest  1. Lie on your back on a firm surface with both legs straight. 2. Bend one of your knees and move it toward your chest until you feel a gentle stretch in your lower back and buttock. 3. Tense your abdominal muscles and repeat the previous step with your other leg. 4. Hold both of your legs in this position by holding on to the backs of your thighs or the fronts of your knees. 5. Hold for __________ seconds. 6. Tense your abdominal muscles and slowly move your legs back to the floor, one leg at a time. Repeat  __________ times. Complete this exercise __________ times a day. Strengthening exercises These exercises build strength and endurance in your back. Endurance is the ability to use your muscles for a long time, even after they get tired. Pelvic tilt This exercise strengthens the muscles that lie deep in the abdomen (deep abdominals). 1. Lie on your back on a firm surface. Bend your knees and keep your feet flat. 2. Tense your abdominal muscles. Tip your pelvis up toward the ceiling and flatten your lower back into the floor. ? To help with this exercise, you may place a small towel under your lower back and try to push your back into the towel. 3. Hold for __________ seconds. 4. Let your muscles relax completely before you repeat this exercise. Repeat __________ times. Complete this exercise __________ times a day. Abdominal crunch  1. Lie on your back on a firm surface. Bend your knees and keep your feet flat. Cross your arms over your chest. 2. Tuck your chin down toward your chest, without bending your neck. 3. Use your abdominal muscles to lift your upper body off the ground, straight up into the air. ? Try to lift yourself until your shoulder blades are off the ground. You may need to work up to this. ? Keep your lower back on the ground while you crunch upward. ? Do not hold your breath. 4. Slowly lower yourself down. Keep your  abdominal muscles tense until you are back to the starting position. Repeat __________ times. Complete this exercise __________ times a day. Alternating arm and leg raises  1. Get on your hands and knees on a firm surface. If you are on a hard floor, you may want to use padding, such as an exercise mat, to cushion your knees. 2. Line up your arms and legs. Your hands should be directly below your shoulders, and your knees should be directly below your hips. 3. Lift your left leg behind you. At the same time, raise your right arm and straighten it in front of  you. ? Do not lift your leg higher than your hip. ? Do not lift your arm higher than your shoulder. ? Keep your abdominal and back muscles tight. ? Keep your hips facing the ground. ? Do not arch your back. ? Keep your balance carefully, and do not hold your breath. 4. Hold for __________ seconds. 5. Slowly return to the starting position. 6. Repeat with your right leg and your left arm. Repeat __________ times. Complete this exercise __________ times a day. Posture and body mechanics Good posture and healthy body mechanics can help to relieve stress in your body's tissues and joints. Body mechanics refers to the movements and positions of your body while you do your daily activities. Posture is part of body mechanics. Good posture means:  Your spine is in its natural S-curve position (neutral).  Your shoulders are pulled back slightly.  Your head is not tipped forward. Follow these guidelines to improve your posture and body mechanics in your everyday activities. Standing   When standing, keep your spine neutral and your feet about hip width apart. Keep a slight bend in your knees. Your ears, shoulders, and hips should line up.  When you do a task in which you stand in one place for a long time, place one foot up on a stable object that is 2-4 inches (5-10 cm) high, such as a footstool. This helps keep your spine neutral. Sitting   When sitting, keep your spine neutral and keep your feet flat on the floor. Use a footrest, if necessary, and keep your thighs parallel to the floor. Avoid rounding your shoulders, and avoid tilting your head forward.  When working at a desk or a computer, keep your desk at a height where your hands are slightly lower than your elbows. Slide your chair under your desk so you are close enough to maintain good posture.  When working at a computer, place your monitor at a height where you are looking straight ahead and you do not have to tilt your head  forward or downward to look at the screen. Resting   When lying down and resting, avoid positions that are most painful for you.  If you have pain with activities such as sitting, bending, stooping, or squatting (flexion-basedactivities), lie in a position in which your body does not bend very much. For example, avoid curling up on your side with your arms and knees near your chest (fetal position).  If you have pain with activities such as standing for a long time or reaching with your arms (extension-basedactivities), lie with your spine in a neutral position and bend your knees slightly. Try the following positions: ? Lying on your side with a pillow between your knees. ? Lying on your back with a pillow under your knees. Lifting   When lifting objects, keep your feet at least shoulder width apart and  tighten your abdominal muscles.  Bend your knees and hips and keep your spine neutral. It is important to lift using the strength of your legs, not your back. Do not lock your knees straight out.  Always ask for help to lift heavy or awkward objects. This information is not intended to replace advice given to you by your health care provider. Make sure you discuss any questions you have with your health care provider. Document Revised: 05/06/2018 Document Reviewed: 03/10/2018 [employer] Patient Education  [employer].

## 2019-08-04 ENCOUNTER — Encounter: Payer: 59 | Admitting: Internal Medicine

## 2019-08-08 ENCOUNTER — Other Ambulatory Visit: Payer: Self-pay

## 2019-08-08 ENCOUNTER — Encounter: Payer: Self-pay | Admitting: Rehabilitative and Restorative Service Providers"

## 2019-08-08 ENCOUNTER — Ambulatory Visit (INDEPENDENT_AMBULATORY_CARE_PROVIDER_SITE_OTHER): Payer: 59 | Admitting: Rehabilitative and Restorative Service Providers"

## 2019-08-08 ENCOUNTER — Other Ambulatory Visit: Payer: Self-pay | Admitting: Internal Medicine

## 2019-08-08 DIAGNOSIS — R262 Difficulty in walking, not elsewhere classified: Secondary | ICD-10-CM | POA: Diagnosis not present

## 2019-08-08 DIAGNOSIS — M6281 Muscle weakness (generalized): Secondary | ICD-10-CM | POA: Diagnosis not present

## 2019-08-08 DIAGNOSIS — R293 Abnormal posture: Secondary | ICD-10-CM

## 2019-08-08 DIAGNOSIS — M545 Low back pain: Secondary | ICD-10-CM | POA: Diagnosis not present

## 2019-08-08 DIAGNOSIS — G8929 Other chronic pain: Secondary | ICD-10-CM

## 2019-08-08 NOTE — Therapy (Signed)
[facility] [address] [place], [place], [address] Phone: [phone]   Fax:  [phone]  Physical Therapy Evaluation  Patient Details  Name: Tony Savage MRN: [id number] Date of Birth: 08-11-1955 Referring Provider (PT): Dr. Lynne Leader   Encounter Date: 08/08/2019   PT End of Session - 08/08/19 1438    Visit Number 1    Number of Visits 12    Date for PT Re-Evaluation 09/19/19    Progress Note Due on Visit 10    PT Start Time 1300    PT Stop Time 1345    PT Time Calculation (min) 45 min    Activity Tolerance Patient tolerated treatment well    Behavior During Therapy [facility] for tasks assessed/performed           Past Medical History:  Diagnosis Date  . ABSCESS, GLUTEAL 06/14/2009  . 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 Anal fissure   . BACK PAIN 01/16/2010  . Diverticulosis   . ERECTILE DYSFUNCTION 12/31/2006  . FEVER UNSPECIFIED 06/08/2007  . GERD 09/29/2006  . GLUCOSE INTOLERANCE 12/31/2006  . HEMORRHOIDS 09/29/2006  . HYPERLIPIDEMIA 09/29/2006  . HYPERSOMNIA 04/30/2009  . HYPERTENSION 09/29/2006  . Impaired fasting glucose 01/01/2007  . KNEE PAIN, LEFT 10/18/2008  . LOW BACK PAIN 12/31/2006  . OBESITY 09/29/2006  . Sleep apnea    cpap  . SYNCOPE, VASOVAGAL 03/14/2008  . VERTEBRAL FRACTURE 09/29/2006    Past Surgical History:  Procedure Laterality Date  . COLONOSCOPY    . drainage of recurrent perirectal abceses    . fistula-in-ano repair    . HEMORRHOID SURGERY      There were no vitals filed for this visit.    Subjective Assessment - 08/08/19 1303    Subjective Pt. indicated some complaints of numbness in Rt leg with standing/walking as primary complaint.  Pt. stated also having some complaints of pain/tightness in posterior knee and lower leg.  Mostly Rt leg but also has Lt leg at times.  Accompanies lower back.  Pt. indicated he has limits in ability shopping, limiting in walking exercise routine (has dog).  Has stationary  bike some without the complaints.    Pertinent History Spondylolisthesis lumbarxray showing L5/S1 Grade 1, lumbar spine scoliosis concave on LtMSK [place] Lt knee, DJD, no baker's cyst    Limitations Standing;Walking    Diagnostic tests xray, MSK [place]    Patient Stated Goals Reduce pain, walking    Currently in Pain? Yes    Pain Location Back   bilateral LE   Pain Descriptors / Indicators Aching;Tightness;Numbness    Pain Type Chronic pain    Pain Radiating Towards Rt LE numbness    Pain Onset More than a month ago    Pain Frequency Intermittent    Aggravating Factors  standing/walking prolonged > 20 mins    Pain Relieving Factors Sitting, rest    Effect of Pain on Daily Activities Limited in walking/standing.              [facility] PT Assessment - 08/08/19 0001      Assessment   Medical Diagnosis Lumbar pain, spondylolisthesis    Referring Provider (PT) Dr. Lynne Leader    Onset Date/Surgical Date 02/08/19    Hand Dominance Right      Precautions   Precautions None      Restrictions   Weight Bearing Restrictions No      Balance Screen   Has the patient fallen in the past 6 months No  Has the patient had a decrease in activity level because of a fear of falling?  Yes   2/2 due to pain   Is the patient reluctant to leave their home because of a fear of falling?  No      Home Ec[employer] residence    Living Arrangements Spouse/significant other    Home Layout Two level    Alternate Level Stairs-Number of Steps 14    Alternate Level Stairs-Rails Right      Prior Function   Level of [place] Retired    Leisure walking       Cognition   Overall Cognitive Status Within Functional Limits for tasks assessed      Observation/Other Assessments   Observations Mild Lt trunk rotation to Lt, lumbar spine scoliosis concave on Lt      Sensation   Light Touch Appears Intact      ROM / Strength   AROM / PROM / Strength  Strength;PROM;AROM      AROM   AROM Assessment Site Lumbar    Lumbar Flexion to mid shin with no complaint    Lumbar Extension 75% WFL ERP, REIS x 5 reduced Rt thigh numbness    Lumbar - Right Side Bend to knee jt, back tightness    Lumbar - Left Side Bend to knee jt, back tightness      Strength   Overall Strength Comments Pelvic floor activation fair, < 10 second steady hold    Strength Assessment Site Ankle;Knee;Hip    Right/Left Hip Left;Right    Right Hip Flexion 5/5    Left Hip Flexion 5/5    Right/Left Knee Left;Right    Right Knee Flexion 5/5    Right Knee Extension 5/5    Left Knee Flexion 5/5    Left Knee Extension 5/5    Right/Left Ankle Left;Right    Right Ankle Dorsiflexion 5/5    Left Ankle Dorsiflexion 5/5      Flexibility   Soft Tissue Assessment /Muscle Length yes    Hamstrings passive SLR Lt 45 deg, Rt 50 deg (increased posterior leg symptoms compared to Lt)      Special Tests    Special Tests Lumbar    Lumbar Tests Straight Leg Raise;Slump Test      Slump test   Comment (-) bilateral      Straight Leg Raise   Comment (-) Crossed SLR bilateral                      Objective measurements completed on examination: See above findings.       [facility] Adult PT Treatment/Exercise - 08/08/19 0001      Self-Care   Self-Care Other Self-Care Comments    Other Self-Care Comments  Self care instruction for centralization techniques c HEP, adjustment of posture positioning in standing dishes, brushing teeth etc. for symptom management.       Exercises   Exercises Other Exercises    Other Exercises  HEP instruction/performance consisting of lumbar ext x 5, supine LTR stretch 15 sec x 5 bilateral, supine bridge x 15, supine TrA contraction 10 sec x 5, wall stretch gastroc 30 sec x 3 c cues for all                  PT Education - 08/08/19 1251    Education Details HEP, POC    Person(s) Educated Patient    Methods  Explanation;Demonstration;Handout    Comprehension Verbalized understanding;Returned demonstration               PT Long Term Goals - 08/08/19 1254      PT LONG TERM GOAL #1   Title Patient will demonstrate/report pain at worst less than or equal to 2/10 to facilitate minimal limitation in daily activity secondary to pain symptoms.    Time 6    Period Weeks    Status New    Target Date 09/19/19      PT LONG TERM GOAL #2   Title Patient will demonstrate independent use of home exercise program to facilitate ability to maintain/progress functional gains from skilled physical therapy services.    Time 6    Period Weeks    Status New    Target Date 09/19/19      PT LONG TERM GOAL #3   Title Pt. will demonstrate lumbar ext 100 % WFL s symptoms to facilitate upright standing/walking posture at PLOF.    Time 6    Period Weeks    Status New    Target Date 09/19/19      PT LONG TERM GOAL #4   Title Pt. will demonstrate ability to walk unrestricted for workout activity.    Time 6    Period Weeks    Status New    Target Date 09/19/19      PT LONG TERM GOAL #5   Title Pt. will demonstrate passive SLR > 60 deg to facilitate improved mobility for daily activity.    Time 6    Period Weeks    Status New    Target Date 09/19/19                  Plan - 08/08/19 1252    Clinical Impression Statement Patient is a  64 y.o. male who comes to clinic with complaints of low back pain c Rt LE symptoms c apparent directional preference for extension in clinic today with mobility, strength deficits that impair their ability to perform usual daily and recreational functional activities without increase difficulty/symptoms at this time.  Patient to benefit from skilled PT services to address impairments and limitations to improve to previous level of function without restriction secondary to condition.    Personal Factors and Comorbidities Comorbidity 3+    Comorbidities HTN,  Hyperlipidema, obesity    Examination-Activity Limitations Stand;Hygiene/Grooming;Bathing;Other   walking   Examination-Participation Restrictions Community Activity;Other   exercise   Stability/Clinical Decision Making Stable/Uncomplicated    Clinical Decision Making Low    Rehab Potential Good    PT Frequency 2x / week    PT Duration 6 weeks    PT Treatment/Interventions ADLs/Self Care Home Management;Electrical Stimulation;Moist Heat;Traction;Balance training;Therapeutic exercise;Therapeutic activities;Iontophoresis 4mg /ml Dexamethasone;Functional mobility training;Stair training;Gait training;Ultrasound;Neuromuscular re-education;Patient/family education;Manual techniques;Taping;Joint Manipulations;Spinal Manipulations;Passive range of motion;Dry needling    PT Next Visit Plan Review HEP, reassess any centralization, progress core stability    PT Home Exercise Plan [email or web address]    Consulted and Agree with Plan of Care Patient;Family member/caregiver    Family Member Consulted wife           Patient will benefit from skilled therapeutic intervention in order to improve the following deficits and impairments:  Decreased endurance, Hypomobility, Impaired sensation, Obesity, Decreased activity tolerance, Decreased strength, Pain, Decreased mobility, Difficulty walking, Postural dysfunction, Impaired flexibility, Decreased coordination, Decreased range of motion  Visit Diagnosis: Chronic bilateral low back pain, unspecified whether sciatica present  Muscle weakness (generalized)  Difficulty  in walking, not elsewhere classified  Abnormal posture     Problem List Patient Active Problem List   Diagnosis Date Noted  . Spondylolisthesis of lumbosacral region 07/26/2019  . Lumbar spinal stenosis 09/22/2016  . Bradycardia 08/01/2016  . PAD (peripheral artery disease) ([place]) 11/28/2014  . Right leg pain 07/27/2014  . Meralgia paresthetica of right side 06/16/2013  . Chronic low back pain  11/01/2011  . Right lumbar radiculopathy 05/06/2011  . Edema 06/14/2010  . Impaired glucose tolerance 05/02/2010  . Encounter for well adult exam with abnormal findings 05/02/2010  . Hematochezia 05/02/2010  . Obstructive sleep apnea 05/29/2009  . HYPERSOMNIA 04/30/2009  . 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 ERECTILE DYSFUNCTION 12/31/2006  . LOW BACK PAIN 12/31/2006  . Hyperlipidemia 09/29/2006  . OBESITY 09/29/2006  . Essential hypertension 09/29/2006  . HEMORRHOIDS 09/29/2006  . GERD 09/29/2006  . VERTEBRAL FRACTURE 09/29/2006    Scot Jun, PT, DPT, OCS, ATC 08/08/19  2:40 PM    [facility] [address] [place], [place], [address] Phone: [phone]   Fax:  [phone]  Name: Tony Savage MRN: [id number] Date of Birth: [birth year]

## 2019-08-08 NOTE — Patient Instructions (Signed)
Access Code: [email or web address] URL: [email or web address] Date: 08/08/2019 Prepared by: Scot Jun  Exercises Supine Lower Trunk Rotation - 2 x daily - 7 x weekly - 5 reps - 1 sets - 15 hold Supine Bridge - 2 x daily - 7 x weekly - 3 sets - 10 reps - 2 hold Gastroc Stretch on Wall - 2 x daily - 7 x weekly - 5 reps - 1 sets - 30 hold Supine Transversus Abdominis Bracing with Pelvic Floor Contraction - 2 x daily - 7 x weekly - 10 reps - 1 sets - 10 hold

## 2019-08-10 ENCOUNTER — Other Ambulatory Visit: Payer: Self-pay | Admitting: Internal Medicine

## 2019-08-10 NOTE — Telephone Encounter (Signed)
Please refill as per office routine med refill policy (all routine meds refilled for 3 mo or monthly per pt preference up to one year from last visit, then month to month grace period for 3 mo, then further med refills will have to be denied)  

## 2019-08-11 ENCOUNTER — Encounter: Payer: 59 | Admitting: Physical Therapy

## 2019-08-17 ENCOUNTER — Other Ambulatory Visit: Payer: Self-pay | Admitting: Internal Medicine

## 2019-08-22 ENCOUNTER — Ambulatory Visit (INDEPENDENT_AMBULATORY_CARE_PROVIDER_SITE_OTHER): Payer: 59 | Admitting: Physical Therapy

## 2019-08-22 ENCOUNTER — Other Ambulatory Visit: Payer: Self-pay

## 2019-08-22 ENCOUNTER — Encounter: Payer: Self-pay | Admitting: Physical Therapy

## 2019-08-22 DIAGNOSIS — R293 Abnormal posture: Secondary | ICD-10-CM | POA: Diagnosis not present

## 2019-08-22 DIAGNOSIS — G8929 Other chronic pain: Secondary | ICD-10-CM

## 2019-08-22 DIAGNOSIS — M545 Low back pain: Secondary | ICD-10-CM

## 2019-08-22 DIAGNOSIS — R262 Difficulty in walking, not elsewhere classified: Secondary | ICD-10-CM | POA: Diagnosis not present

## 2019-08-22 DIAGNOSIS — M6281 Muscle weakness (generalized): Secondary | ICD-10-CM | POA: Diagnosis not present

## 2019-08-22 NOTE — Therapy (Addendum)
[facility] [address] [place], [place], [address] Phone: [phone]   Fax:  [phone]  Physical Therapy Treatment/Discharge addendum PHYSICAL THERAPY DISCHARGE SUMMARY  Visits from Start of Care: 2  Current functional level related to goals / functional outcomes: See below   Remaining deficits: See below   Education / Equipment: HEP  Plan: Patient agrees to discharge.  Patient goals were not met. Patient is being discharged due to not returning since the last visit.  ?????  Elsie Ra, PT, DPT 11/14/19 10:54 AM      Patient Details  Name: Tony Savage MRN: [id number] Date of Birth: 1955/03/09 Referring Provider (PT): Dr. Lynne Leader   Encounter Date: 08/22/2019   PT End of Session - 08/22/19 0820    Visit Number 2    Number of Visits 12    Date for PT Re-Evaluation 09/19/19    Progress Note Due on Visit 10    PT Start Time 0800    PT Stop Time 0845    PT Time Calculation (min) 45 min    Activity Tolerance Patient tolerated treatment well    Behavior During Therapy [facility] for tasks assessed/performed           Past Medical History:  Diagnosis Date  . ABSCESS, GLUTEAL 06/14/2009  . 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 Anal fissure   . BACK PAIN 01/16/2010  . Diverticulosis   . ERECTILE DYSFUNCTION 12/31/2006  . FEVER UNSPECIFIED 06/08/2007  . GERD 09/29/2006  . GLUCOSE INTOLERANCE 12/31/2006  . HEMORRHOIDS 09/29/2006  . HYPERLIPIDEMIA 09/29/2006  . HYPERSOMNIA 04/30/2009  . HYPERTENSION 09/29/2006  . Impaired fasting glucose 01/01/2007  . KNEE PAIN, LEFT 10/18/2008  . LOW BACK PAIN 12/31/2006  . OBESITY 09/29/2006  . Sleep apnea    cpap  . SYNCOPE, VASOVAGAL 03/14/2008  . VERTEBRAL FRACTURE 09/29/2006    Past Surgical History:  Procedure Laterality Date  . COLONOSCOPY    . drainage of recurrent perirectal abceses    . fistula-in-ano repair    . HEMORRHOID SURGERY      There were no vitals filed for this visit.    Subjective Assessment - 08/22/19 0806    Subjective relays no pain upon arrival, woke up feeling good. However this weekend he had a lot of pain while trying to stand while cooking but if he sat down on worked on his exercises it helped.    Pertinent History Spondylolisthesis lumbarxray showing L5/S1 Grade 1, lumbar spine scoliosis concave on LtMSK [place] Lt knee, DJD, no baker's cyst    Limitations Standing;Walking    Diagnostic tests xray, MS[facility] [place]    Patient Stated Goals Reduce pain, walking    Pain Onset More than a month ago                             [facility] Adult PT Treatment/Exercise - 08/22/19 0001      Exercises   Exercises Lumbar      Lumbar Exercises: Stretches   Single Knee to Chest Stretch Right;Left;2 reps;30 seconds    Lower Trunk Rotation 10 seconds;5 reps    Piriformis Stretch Right;Left;2 reps;30 seconds      Lumbar Exercises: Aerobic   Recumbent Bike 5 min       Lumbar Exercises: Machines for Strengthening   Leg Press 100 lbs bilat push 30 reps    Other Lumbar Machine Exercise Lat pull 35 lbs X 20 reps  Lumbar Exercises: Standing   Row Both;20 reps    Theraband Level (Row) Level 3 (Green)    Shoulder Extension Both;20 reps    Theraband Level (Shoulder Extension) Level 3 (Green)    Other Standing Lumbar Exercises lumbar extension 10 reps      Lumbar Exercises: Supine   Clam 20 reps    Clam Limitations green    Bridge Limitations 2 sets of 10      Manual Therapy   Manual therapy comments long axis distraction to Rt LE                       PT Long Term Goals - 08/08/19 1254      PT LONG TERM GOAL #1   Title Patient will demonstrate/report pain at worst less than or equal to 2/10 to facilitate minimal limitation in daily activity secondary to pain symptoms.    Time 6    Period Weeks    Status New    Target Date 09/19/19      PT LONG TERM GOAL #2   Title Patient will demonstrate independent use of home exercise program  to facilitate ability to maintain/progress functional gains from skilled physical therapy services.    Time 6    Period Weeks    Status New    Target Date 09/19/19      PT LONG TERM GOAL #3   Title Pt. will demonstrate lumbar ext 100 % WFL s symptoms to facilitate upright standing/walking posture at PLOF.    Time 6    Period Weeks    Status New    Target Date 09/19/19      PT LONG TERM GOAL #4   Title Pt. will demonstrate ability to walk unrestricted for workout activity.    Time 6    Period Weeks    Status New    Target Date 09/19/19      PT LONG TERM GOAL #5   Title Pt. will demonstrate passive SLR > 60 deg to facilitate improved mobility for daily activity.    Time 6    Period Weeks    Status New    Target Date 09/19/19                 Plan - 08/22/19 7654    Clinical Impression Statement He appears to be responding well to PT and HEP with overall decreased pain and reduced radicular symptoms into his Rt LE. He had good tolerance to exercises today without complaints. PT will continue to progress as able toward his functional goals.    Personal Factors and Comorbidities Comorbidity 3+    Comorbidities HTN, Hyperlipidema, obesity    Examination-Activity Limitations Stand;Hygiene/Grooming;Bathing;Other   walking   Examination-Participation Restrictions Community Activity;Other   exercise   Stability/Clinical Decision Making Stable/Uncomplicated    Rehab Potential Good    PT Frequency 2x / week    PT Duration 6 weeks    PT Treatment/Interventions ADLs/Self Care Home Management;Electrical Stimulation;Moist Heat;Traction;Balance training;Therapeutic exercise;Therapeutic activities;Iontophoresis 58m/ml Dexamethasone;Functional mobility training;Stair training;Gait training;Ultrasound;Neuromuscular re-education;Patient/family education;Manual techniques;Taping;Joint Manipulations;Spinal Manipulations;Passive range of motion;Dry needling    PT Next Visit Plan reassess any  centralization, progress core stability    PT Home Exercise Plan [email or web address]   Consulted and Agree with Plan of Care Patient;Family member/caregiver    Family Member Consulted wife           Patient will benefit from skilled therapeutic intervention in order to improve the following deficits  and impairments:  Decreased endurance, Hypomobility, Impaired sensation, Obesity, Decreased activity tolerance, Decreased strength, Pain, Decreased mobility, Difficulty walking, Postural dysfunction, Impaired flexibility, Decreased coordination, Decreased range of motion  Visit Diagnosis: Chronic bilateral low back pain, unspecified whether sciatica present  Muscle weakness (generalized)  Difficulty in walking, not elsewhere classified  Abnormal posture     Problem List Patient Active Problem List   Diagnosis Date Noted  . Spondylolisthesis of lumbosacral region 07/26/2019  . Lumbar spinal stenosis 09/22/2016  . Bradycardia 08/01/2016  . PAD (peripheral artery disease) ([place]) 11/28/2014  . Right leg pain 07/27/2014  . Meralgia paresthetica of right side 06/16/2013  . Chronic low back pain 11/01/2011  . Right lumbar radiculopathy 05/06/2011  . Edema 06/14/2010  . Impaired glucose tolerance 05/02/2010  . Encounter for well adult exam with abnormal findings 05/02/2010  . Hematochezia 05/02/2010  . Obstructive sleep apnea 05/29/2009  . HYPERSOMNIA 04/30/2009  . 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 ERECTILE DYSFUNCTION 12/31/2006  . LOW BACK PAIN 12/31/2006  . Hyperlipidemia 09/29/2006  . OBESITY 09/29/2006  . Essential hypertension 09/29/2006  . HEMORRHOIDS 09/29/2006  . GERD 09/29/2006  . VERTEBRAL FRACTURE 09/29/2006    Silvestre Mesi 08/22/2019, 8:44 AM  [facility] [address] [place], [place], [address] Phone: [phone]   Fax:  [phone]  Name: Tony Savage MRN: [id number] Date of Birth: 1955-08-11

## 2019-08-25 ENCOUNTER — Encounter: Payer: 59 | Admitting: Rehabilitative and Restorative Service Providers"

## 2019-08-30 ENCOUNTER — Encounter: Payer: 59 | Admitting: Rehabilitative and Restorative Service Providers"

## 2019-09-02 ENCOUNTER — Encounter: Payer: 59 | Admitting: Rehabilitative and Restorative Service Providers"

## 2019-09-05 ENCOUNTER — Encounter: Payer: 59 | Admitting: Physical Therapy

## 2019-09-06 ENCOUNTER — Ambulatory Visit: Payer: 59 | Admitting: Family Medicine

## 2019-09-07 ENCOUNTER — Other Ambulatory Visit: Payer: Self-pay | Admitting: Internal Medicine

## 2019-09-07 NOTE — Telephone Encounter (Signed)
Please refill as per office routine med refill policy (all routine meds refilled for 3 mo or monthly per pt preference up to one year from last visit, then month to month grace period for 3 mo, then further med refills will have to be denied)  

## 2019-09-08 ENCOUNTER — Encounter: Payer: 59 | Admitting: Rehabilitative and Restorative Service Providers"

## 2019-09-12 ENCOUNTER — Encounter: Payer: 59 | Admitting: Physical Therapy

## 2019-09-15 ENCOUNTER — Encounter: Payer: 59 | Admitting: Rehabilitative and Restorative Service Providers"

## 2019-09-21 ENCOUNTER — Ambulatory Visit: Payer: 59 | Admitting: Family Medicine

## 2019-10-07 ENCOUNTER — Other Ambulatory Visit: Payer: Self-pay | Admitting: Internal Medicine

## 2019-11-12 ENCOUNTER — Ambulatory Visit (INDEPENDENT_AMBULATORY_CARE_PROVIDER_SITE_OTHER): Payer: 59

## 2019-11-12 DIAGNOSIS — Z23 Encounter for immunization: Secondary | ICD-10-CM | POA: Diagnosis not present

## 2019-11-16 ENCOUNTER — Other Ambulatory Visit: Payer: Self-pay

## 2019-12-13 ENCOUNTER — Telehealth: Payer: Self-pay | Admitting: Internal Medicine

## 2019-12-13 NOTE — Telephone Encounter (Signed)
potassium chloride (MICRO-K) 10 MEQ CR capsule Patient states the medication is making him feel sick and he was wondering if he needs to stop it or be switched to something else. Patient # [id number]

## 2019-12-13 NOTE — Telephone Encounter (Signed)
Sent to Dr. [name]. 

## 2019-12-14 MED ORDER — POTASSIUM CHLORIDE ER 10 MEQ PO TBCR: 10.0000 meq | EXTENDED_RELEASE_TABLET | Freq: Every day | ORAL | 3 refills | Status: DC

## 2019-12-14 NOTE — Telephone Encounter (Signed)
Ok done to [facility] 

## 2019-12-31 ENCOUNTER — Ambulatory Visit: Payer: 59 | Attending: Internal Medicine

## 2019-12-31 DIAGNOSIS — Z23 Encounter for immunization: Secondary | ICD-10-CM

## 2019-12-31 NOTE — Progress Notes (Signed)
   Covid-19 Vaccination Clinic  Name:  MAYAN KLOEPFER    MRN: [id number] DOB: February 27, 1955  12/31/2019  Mr. Carreno was observed post Covid-19 immunization for 15 minutes without incident. He was provided with Vaccine Information Sheet and instruction to access the V-Safe system.   Mr. Rickey was instructed to call 911 with any severe reactions post vaccine: [name] Kitchen Difficulty breathing  . Swelling of face and throat  . A fast heartbeat  . A bad rash all over body  . Dizziness and weakness   Immunizations Administered    Name Date Dose VIS Date Route   Pfizer COVID-19 Vaccine 12/31/2019  9:48 AM 0.3 mL 11/16/2019 Intramuscular   Manufacturer: [place]   Lot: [id number]   NDC: [address]-8

## 2020-01-24 ENCOUNTER — Ambulatory Visit: Payer: 59 | Admitting: Internal Medicine

## 2020-01-30 ENCOUNTER — Ambulatory Visit: Payer: 59 | Admitting: Internal Medicine

## 2020-01-31 ENCOUNTER — Other Ambulatory Visit (INDEPENDENT_AMBULATORY_CARE_PROVIDER_SITE_OTHER): Payer: 59

## 2020-01-31 DIAGNOSIS — R7302 Impaired glucose tolerance (oral): Secondary | ICD-10-CM | POA: Diagnosis not present

## 2020-01-31 DIAGNOSIS — E785 Hyperlipidemia, unspecified: Secondary | ICD-10-CM | POA: Diagnosis not present

## 2020-01-31 LAB — BASIC METABOLIC PANEL
BUN: 18 mg/dL (ref 6–23)
CO2: 27 mEq/L (ref 19–32)
Calcium: 9.9 mg/dL (ref 8.4–10.5)
Chloride: 101 mEq/L (ref 96–112)
Creatinine, Ser: 1.37 mg/dL (ref 0.40–1.50)
GFR: 55.21 mL/min — ABNORMAL LOW (ref >60.00)
Glucose, Bld: 108 mg/dL — ABNORMAL HIGH (ref 70–99)
Potassium: 3.5 mEq/L (ref 3.5–5.1)
Sodium: 139 mEq/L (ref 135–145)

## 2020-01-31 LAB — HEMOGLOBIN A1C: Hgb A1c MFr Bld: 5.1 % (ref 4.6–6.5)

## 2020-01-31 LAB — LIPID PANEL
Cholesterol: 153 mg/dL (ref 0–200)
HDL: 48 mg/dL (ref >39.00)
LDL Cholesterol: 79 mg/dL (ref 0–99)
NonHDL: 105.03
Total CHOL/HDL Ratio: 3
Triglycerides: 132 mg/dL (ref 0.0–149.0)
VLDL: 26.4 mg/dL (ref 0.0–40.0)

## 2020-01-31 LAB — HEPATIC FUNCTION PANEL
ALT: 24 U/L (ref 0–53)
AST: 29 U/L (ref 0–37)
Albumin: 4.7 g/dL (ref 3.5–5.2)
Alkaline Phosphatase: 75 U/L (ref 39–117)
Bilirubin, Direct: 0.2 mg/dL (ref 0.0–0.3)
Total Bilirubin: 1 mg/dL (ref 0.2–1.2)
Total Protein: 7.1 g/dL (ref 6.0–8.3)

## 2020-02-01 ENCOUNTER — Other Ambulatory Visit: Payer: Self-pay

## 2020-02-02 ENCOUNTER — Encounter: Payer: Self-pay | Admitting: Internal Medicine

## 2020-02-02 ENCOUNTER — Ambulatory Visit: Payer: 59 | Admitting: Internal Medicine

## 2020-02-02 VITALS — BP 110/80 | HR 70 | Temp 98.3°F | Ht 72.0 in | Wt 303.0 lb

## 2020-02-02 DIAGNOSIS — I10 Essential (primary) hypertension: Secondary | ICD-10-CM

## 2020-02-02 DIAGNOSIS — R7302 Impaired glucose tolerance (oral): Secondary | ICD-10-CM

## 2020-02-02 DIAGNOSIS — E7849 Other hyperlipidemia: Secondary | ICD-10-CM | POA: Diagnosis not present

## 2020-02-02 DIAGNOSIS — M5441 Lumbago with sciatica, right side: Secondary | ICD-10-CM | POA: Diagnosis not present

## 2020-02-02 DIAGNOSIS — G8929 Other chronic pain: Secondary | ICD-10-CM

## 2020-02-02 DIAGNOSIS — E559 Vitamin D deficiency, unspecified: Secondary | ICD-10-CM

## 2020-02-02 DIAGNOSIS — E538 Deficiency of other specified B group vitamins: Secondary | ICD-10-CM

## 2020-02-02 DIAGNOSIS — N289 Disorder of kidney and ureter, unspecified: Secondary | ICD-10-CM

## 2020-02-02 DIAGNOSIS — Z0000 Encounter for general adult medical examination without abnormal findings: Secondary | ICD-10-CM

## 2020-02-02 DIAGNOSIS — E876 Hypokalemia: Secondary | ICD-10-CM

## 2020-02-02 MED ORDER — TRAMADOL HCL 50 MG PO TABS: 50.0000 mg | ORAL_TABLET | Freq: Four times a day (QID) | ORAL | 2 refills | Status: DC | PRN

## 2020-02-02 NOTE — Patient Instructions (Signed)
Please take all new medication as prescribed - the tramadol as needed for pain - to [facility]  Please continue all other medications as before, and refills have been done if requested.  Please have the pharmacy call with any other refills you may need.  Please continue your efforts at being more active, low cholesterol diet, and weight control.  Please keep your appointments with your specialists as you may have planned  Please make an Appointment to return in 6 months, or sooner if needed, also with Lab Appointment for testing done 3-5 days before at the FIRST FLOOR Lab (so this is for TWO appointments - please see the scheduling desk as you leave)  Due to the ongoing Covid 19 pandemic, our lab now requires an appointment for any labs done at our office.  If you need labs done and do not have an appointment, please call our office ahead of time to schedule before presenting to the lab for your testing.

## 2020-02-02 NOTE — Progress Notes (Signed)
Established Patient Office Visit  Subjective:  Patient ID: Tony Savage, male    DOB: 02/21/1955  Age: 65 y.o. MRN: [id number]      Chief Complaint:  follow up HTN, HLD and hyperglycemia, acute on chronic LBP, hypokalemia, and renal insufficiency       HPI:  Tony Savage is a 65 y.o. male here to f/u; overall doing ok, c/o acute on chronic LBP with bilateral sciatica and numbness in the past wk with intermittent weakness, followed by sport med, worse now on the left more then right despite advil, excercises.  Taking 300 bid gabapentin only due to memory trouble when takes more, has handicapped plate for car, declines MRI or ortho for now, hoping for better pain control only.  Also of note, K has been stopped recenlty, and mild renal insufficiency noted for first time to labs.  Otherwise  Pt denies chest pain, increasing sob or doe, wheezing, orthopnea, PND, increased LE swelling, palpitations, dizziness or syncope.  Pt denies new neurological symptoms such as new headache, or facial or extremity weakness or numbness.  Pt denies polydipsia, polyuria, or symptomatic low sugars. Pt states overall good compliance with meds, mostly trying to follow appropriate diet, with wt overall stable,  but little exercise however. .        Wt Readings from Last 3 Encounters:  02/02/20 (!) 303 lb (137.4 kg)  07/26/19 (!) 309 lb 6.4 oz (140.3 kg)  07/22/19 (!) 307 lb (139.3 kg)   BP Readings from Last 3 Encounters:  02/02/20 110/80  07/26/19 120/72  07/22/19 128/76      Past Medical History:  Diagnosis Date  . ABSCESS, GLUTEAL 06/14/2009  . 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 Anal fissure   . BACK PAIN 01/16/2010  . Diverticulosis   . ERECTILE DYSFUNCTION 12/31/2006  . FEVER UNSPECIFIED 06/08/2007  . GERD 09/29/2006  . GLUCOSE INTOLERANCE 12/31/2006  . HEMORRHOIDS 09/29/2006  . HYPERLIPIDEMIA 09/29/2006  . HYPERSOMNIA 04/30/2009  . HYPERTENSION 09/29/2006  . Impaired fasting glucose 01/01/2007  .  KNEE PAIN, LEFT 10/18/2008  . LOW BACK PAIN 12/31/2006  . OBESITY 09/29/2006  . Sleep apnea    cpap  . SYNCOPE, VASOVAGAL 03/14/2008  . VERTEBRAL FRACTURE 09/29/2006   Past Surgical History:  Procedure Laterality Date  . COLONOSCOPY    . drainage of recurrent perirectal abceses    . fistula-in-ano repair    . HEMORRHOID SURGERY      reports that he quit smoking about 22 years ago. His smoking use included cigarettes. He has a 37.50 pack-year smoking history. He has never used smokeless tobacco. He reports current alcohol use of about 14.0 standard drinks of alcohol per week. He reports that he does not use drugs. family history includes Allergies in his sister; Colon cancer (age of onset: 4980) in his father; Colon polyps in his brother; Diabetes in his mother; Heart disease in his father; Prostate cancer in his father. No Known Allergies Current Outpatient Medications on File Prior to Visit  Medication Sig Dispense Refill  . aspirin 81 MG EC tablet Take 81 mg by mouth daily.    . fenofibrate 160 MG tablet TAKE 1 TABLET BY MOUTH EVERY DAY 30 tablet 5  . gabapentin (NEURONTIN) 300 MG capsule TAKE 1 CAPSULE(300 MG) BY MOUTH THREE TIMES DAILY 270 capsule 1  . lisinopril-hydrochlorothiazide (ZESTORETIC) 20-12.5 MG tablet TAKE 2 TABLETS BY MOUTH DAILY 180 tablet 1  . pantoprazole (PROTONIX) 40 MG tablet TAKE 1 TABLET(40 MG)  BY MOUTH TWICE DAILY 180 tablet 3  . rosuvastatin (CRESTOR) 10 MG tablet TAKE 1 TABLET BY MOUTH EVERY DAY 30 tablet 5   No current facility-administered medications on file prior to visit.        ROS:  All others reviewed and negative.  Objective        PE:  BP 110/80 (BP Location: Left Arm, Patient Position: Sitting, Cuff Size: Large)   Pulse 70   Temp 98.3 F (36.8 C) (Oral)   Ht 6' (1.829 m)   Wt (!) 303 lb (137.4 kg)   SpO2 97%   BMI 41.09 kg/m                 Constitutional: Pt appears in NAD               HENT: Head: NCAT.                Right Ear: External  ear normal.                 Left Ear: External ear normal.                Eyes: . Pupils are equal, round, and reactive to light. Conjunctivae and EOM are normal               Nose: without d/c or deformity               Neck: Neck supple. Gross normal ROM               Cardiovascular: Normal rate and regular rhythm.                 Pulmonary/Chest: Effort normal and breath sounds without rales or wheezing.                Abd:  Soft, NT, ND, + BS, no organomegaly               Neurological: Pt is alert. At baseline orientation, motor grossly intact               Skin: Skin is warm. No rashes, no other new lesions, LE edema - none               Psychiatric: Pt behavior is normal without agitation   Assessment/Plan:  Tony Savage is a 65 y.o. Black or African American [2] male with  has a past medical history of ABSCESS, GLUTEAL (06/14/2009), Acute gouty arthropathy (10/18/2008), Anal fissure, BACK PAIN (01/16/2010), Diverticulosis, ERECTILE DYSFUNCTION (12/31/2006), FEVER UNSPECIFIED (06/08/2007), GERD (09/29/2006), GLUCOSE INTOLERANCE (12/31/2006), HEMORRHOIDS (09/29/2006), HYPERLIPIDEMIA (09/29/2006), HYPERSOMNIA (04/30/2009), HYPERTENSION (09/29/2006), Impaired fasting glucose (01/01/2007), KNEE PAIN, LEFT (10/18/2008), LOW BACK PAIN (12/31/2006), OBESITY (09/29/2006), Sleep apnea, SYNCOPE, VASOVAGAL (03/14/2008), and VERTEBRAL FRACTURE (09/29/2006).   Assessment Plan  See problem oriented assessment and plan Labs reviewed for each problem: Lab Results  Component Value Date   WBC 6.2 07/19/2019   HGB 11.7 (L) 07/19/2019   HCT 34.5 (L) 07/19/2019   PLT 184.0 07/19/2019   GLUCOSE 108 (H) 01/31/2020   CHOL 153 01/31/2020   TRIG 132.0 01/31/2020   HDL 48.00 01/31/2020   LDLDIRECT 134.8 04/29/2011   LDLCALC 79 01/31/2020   ALT 24 01/31/2020   AST 29 01/31/2020   NA 139 01/31/2020   K 3.5 01/31/2020   CL 101 01/31/2020   CREATININE 1.37 01/31/2020   BUN 18 01/31/2020   CO2 27 01/31/2020   TSH 3.79  07/19/2019  PSA 1.78 07/19/2019   HGBA1C 5.1 01/31/2020   MICROALBUR 0.7 07/25/2015    Micro: none  Cardiac tracings I have personally interpreted today:  none  Pertinent Radiological findings (summarize): none    There are no preventive care reminders to display for this patient.  Lab Results  Component Value Date   TSH 3.79 07/19/2019   Lab Results  Component Value Date   WBC 6.2 07/19/2019   HGB 11.7 (L) 07/19/2019   HCT 34.5 (L) 07/19/2019   MCV 99.4 07/19/2019   PLT 184.0 07/19/2019   Lab Results  Component Value Date   NA 139 01/31/2020   K 3.5 01/31/2020   CO2 27 01/31/2020   GLUCOSE 108 (H) 01/31/2020   BUN 18 01/31/2020   CREATININE 1.37 01/31/2020   BILITOT 1.0 01/31/2020   ALKPHOS 75 01/31/2020   AST 29 01/31/2020   ALT 24 01/31/2020   PROT 7.1 01/31/2020   ALBUMIN 4.7 01/31/2020   CALCIUM 9.9 01/31/2020   ANIONGAP 9 09/29/2014   GFR 55.21 (L) 01/31/2020   Lab Results  Component Value Date   CHOL 153 01/31/2020   Lab Results  Component Value Date   HDL 48.00 01/31/2020   Lab Results  Component Value Date   LDLCALC 79 01/31/2020   Lab Results  Component Value Date   TRIG 132.0 01/31/2020   Lab Results  Component Value Date   CHOLHDL 3 01/31/2020   Lab Results  Component Value Date   HGBA1C 5.1 01/31/2020      Assessment & Plan:   Problem List Items Addressed This Visit      High   Chronic low back pain    With acute on chronic flare, ok for tramadol qid prn,  to f/u any worsening symptoms or concerns with sport med      Relevant Medications   traMADol (ULTRAM) 50 MG tablet     Medium   Impaired glucose tolerance - Primary    Lab Results  Component Value Date   HGBA1C 5.1 01/31/2020   Stable, pt to continue current medical treatment - diet       Relevant Orders   Hemoglobin A1c   Hyperlipidemia    Lab Results  Component Value Date   LDLCALC 79 01/31/2020   Stable, pt to continue current statin crestor 10        Relevant Orders   Lipid panel   Basic metabolic panel   Hepatic function panel   CBC with Differential/Platelet   TSH   Urinalysis, Routine w reflex microscopic   Essential hypertension    BP Readings from Last 3 Encounters:  02/02/20 110/80  07/26/19 120/72  07/22/19 128/76   Stable, pt to continue medical treatment lisinopril HCT         Low   Renal insufficiency    Recent mild, ? New ckd 3- for f/u lab      Hypokalemia    Recent mild, for f/u lab       Other Visit Diagnoses    Preventative health care       Relevant Orders   PSA   Vitamin D deficiency       Relevant Orders   VITAMIN D 25 Hydroxy (Vit-D Deficiency, Fractures)   B12 deficiency       Relevant Orders   Vitamin B12      Meds ordered this encounter  Medications  . traMADol (ULTRAM) 50 MG tablet    Sig: Take 1 tablet (50 mg  total) by mouth every 6 (six) hours as needed.    Dispense:  60 tablet    Refill:  2    Follow-up: Return in about 6 months (around 08/01/2020).   Cathlean Cower, MD 02/02/2020 9:17 AM [facility]

## 2020-02-13 ENCOUNTER — Encounter: Payer: Self-pay | Admitting: Internal Medicine

## 2020-02-13 DIAGNOSIS — E876 Hypokalemia: Secondary | ICD-10-CM | POA: Insufficient documentation

## 2020-02-13 DIAGNOSIS — N289 Disorder of kidney and ureter, unspecified: Secondary | ICD-10-CM | POA: Insufficient documentation

## 2020-02-13 NOTE — Assessment & Plan Note (Signed)
Lab Results  Component Value Date   HGBA1C 5.1 01/31/2020   Stable, pt to continue current medical treatment - diet

## 2020-02-13 NOTE — Assessment & Plan Note (Signed)
Recent mild, for f/u lab

## 2020-02-13 NOTE — Assessment & Plan Note (Signed)
Lab Results  Component Value Date   LDLCALC 79 01/31/2020   Stable, pt to continue current statin crestor 10

## 2020-02-13 NOTE — Assessment & Plan Note (Signed)
Recent mild, ? New ckd 3- for f/u lab

## 2020-02-13 NOTE — Assessment & Plan Note (Signed)
BP Readings from Last 3 Encounters:  02/02/20 110/80  07/26/19 120/72  07/22/19 128/76   Stable, pt to continue medical treatment lisinopril HCT

## 2020-02-13 NOTE — Assessment & Plan Note (Signed)
With acute on chronic flare, ok for tramadol qid prn,  to f/u any worsening symptoms or concerns with sport med

## 2020-02-19 ENCOUNTER — Other Ambulatory Visit: Payer: Self-pay | Admitting: Internal Medicine

## 2020-02-19 NOTE — Telephone Encounter (Signed)
Please refill as per office routine med refill policy (all routine meds refilled for 3 mo or monthly per pt preference up to one year from last visit, then month to month grace period for 3 mo, then further med refills will have to be denied)  

## 2020-03-27 ENCOUNTER — Other Ambulatory Visit: Payer: Self-pay | Admitting: Internal Medicine

## 2020-03-27 NOTE — Telephone Encounter (Signed)
Please refill as per office routine med refill policy (all routine meds refilled for 3 mo or monthly per pt preference up to one year from last visit, then month to month grace period for 3 mo, then further med refills will have to be denied)  

## 2020-05-21 ENCOUNTER — Other Ambulatory Visit: Payer: Self-pay | Admitting: Internal Medicine

## 2020-06-03 ENCOUNTER — Other Ambulatory Visit: Payer: Self-pay | Admitting: Internal Medicine

## 2020-06-03 NOTE — Telephone Encounter (Signed)
Please refill as per office routine med refill policy (all routine meds refilled for 3 mo or monthly per pt preference up to one year from last visit, then month to month grace period for 3 mo, then further med refills will have to be denied)  

## 2020-08-06 ENCOUNTER — Encounter: Payer: 59 | Admitting: Internal Medicine

## 2020-08-15 ENCOUNTER — Other Ambulatory Visit (INDEPENDENT_AMBULATORY_CARE_PROVIDER_SITE_OTHER): Payer: 59

## 2020-08-15 DIAGNOSIS — R7302 Impaired glucose tolerance (oral): Secondary | ICD-10-CM

## 2020-08-15 DIAGNOSIS — Z0000 Encounter for general adult medical examination without abnormal findings: Secondary | ICD-10-CM | POA: Diagnosis not present

## 2020-08-15 DIAGNOSIS — E538 Deficiency of other specified B group vitamins: Secondary | ICD-10-CM | POA: Diagnosis not present

## 2020-08-15 DIAGNOSIS — E7849 Other hyperlipidemia: Secondary | ICD-10-CM

## 2020-08-15 DIAGNOSIS — E559 Vitamin D deficiency, unspecified: Secondary | ICD-10-CM | POA: Diagnosis not present

## 2020-08-15 LAB — CBC WITH DIFFERENTIAL/PLATELET
Basophils Absolute: 0 10*3/uL (ref 0.0–0.1)
Basophils Relative: 0.8 % (ref 0.0–3.0)
Eosinophils Absolute: 0.3 10*3/uL (ref 0.0–0.7)
Eosinophils Relative: 4.7 % (ref 0.0–5.0)
HCT: 37.5 % — ABNORMAL LOW (ref 39.0–52.0)
Hemoglobin: 12.7 g/dL — ABNORMAL LOW (ref 13.0–17.0)
Lymphocytes Relative: 28.5 % (ref 12.0–46.0)
Lymphs Abs: 1.7 10*3/uL (ref 0.7–4.0)
MCHC: 33.8 g/dL (ref 30.0–36.0)
MCV: 97.8 fl (ref 78.0–100.0)
Monocytes Absolute: 0.6 10*3/uL (ref 0.1–1.0)
Monocytes Relative: 9.5 % (ref 3.0–12.0)
Neutro Abs: 3.3 10*3/uL (ref 1.4–7.7)
Neutrophils Relative %: 56.5 % (ref 43.0–77.0)
Platelets: 219 10*3/uL (ref 150.0–400.0)
RBC: 3.84 Mil/uL — ABNORMAL LOW (ref 4.22–5.81)
RDW: 13.2 % (ref 11.5–15.5)
WBC: 5.8 10*3/uL (ref 4.0–10.5)

## 2020-08-15 LAB — HEPATIC FUNCTION PANEL
ALT: 20 U/L (ref 0–53)
AST: 24 U/L (ref 0–37)
Albumin: 4.3 g/dL (ref 3.5–5.2)
Alkaline Phosphatase: 82 U/L (ref 39–117)
Bilirubin, Direct: 0.2 mg/dL (ref 0.0–0.3)
Total Bilirubin: 0.9 mg/dL (ref 0.2–1.2)
Total Protein: 6.8 g/dL (ref 6.0–8.3)

## 2020-08-15 LAB — URINALYSIS, ROUTINE W REFLEX MICROSCOPIC
Bilirubin Urine: NEGATIVE
Hgb urine dipstick: NEGATIVE
Ketones, ur: NEGATIVE
Leukocytes,Ua: NEGATIVE
Nitrite: NEGATIVE
RBC / HPF: NONE SEEN (ref 0–?)
Specific Gravity, Urine: 1.02 (ref 1.000–1.030)
Total Protein, Urine: NEGATIVE
Urine Glucose: NEGATIVE
Urobilinogen, UA: 2 — AB (ref 0.0–1.0)
pH: 6 (ref 5.0–8.0)

## 2020-08-15 LAB — BASIC METABOLIC PANEL
BUN: 20 mg/dL (ref 6–23)
CO2: 26 mEq/L (ref 19–32)
Calcium: 9.9 mg/dL (ref 8.4–10.5)
Chloride: 102 mEq/L (ref 96–112)
Creatinine, Ser: 1.52 mg/dL — ABNORMAL HIGH (ref 0.40–1.50)
GFR: 48.55 mL/min — ABNORMAL LOW (ref >60.00)
Glucose, Bld: 107 mg/dL — ABNORMAL HIGH (ref 70–99)
Potassium: 4 mEq/L (ref 3.5–5.1)
Sodium: 139 mEq/L (ref 135–145)

## 2020-08-15 LAB — LIPID PANEL
Cholesterol: 155 mg/dL (ref 0–200)
HDL: 48.3 mg/dL (ref >39.00)
LDL Cholesterol: 81 mg/dL (ref 0–99)
NonHDL: 106.62
Total CHOL/HDL Ratio: 3
Triglycerides: 127 mg/dL (ref 0.0–149.0)
VLDL: 25.4 mg/dL (ref 0.0–40.0)

## 2020-08-15 LAB — TSH: TSH: 5.07 u[IU]/mL (ref 0.35–5.50)

## 2020-08-15 LAB — HEMOGLOBIN A1C: Hgb A1c MFr Bld: 5.1 % (ref 4.6–6.5)

## 2020-08-15 LAB — VITAMIN B12: Vitamin B-12: 302 pg/mL (ref 211–911)

## 2020-08-15 LAB — PSA: PSA: 2.38 ng/mL (ref 0.10–4.00)

## 2020-08-15 LAB — VITAMIN D 25 HYDROXY (VIT D DEFICIENCY, FRACTURES): VITD: 68.02 ng/mL (ref 30.00–100.00)

## 2020-08-20 ENCOUNTER — Other Ambulatory Visit: Payer: Self-pay

## 2020-08-20 ENCOUNTER — Ambulatory Visit (INDEPENDENT_AMBULATORY_CARE_PROVIDER_SITE_OTHER): Payer: 59 | Admitting: Internal Medicine

## 2020-08-20 VITALS — BP 138/86 | HR 68 | Temp 98.4°F | Ht 72.0 in | Wt 298.0 lb

## 2020-08-20 DIAGNOSIS — N1831 Chronic kidney disease, stage 3a: Secondary | ICD-10-CM

## 2020-08-20 DIAGNOSIS — Z0001 Encounter for general adult medical examination with abnormal findings: Secondary | ICD-10-CM | POA: Diagnosis not present

## 2020-08-20 DIAGNOSIS — R7302 Impaired glucose tolerance (oral): Secondary | ICD-10-CM

## 2020-08-20 DIAGNOSIS — I10 Essential (primary) hypertension: Secondary | ICD-10-CM

## 2020-08-20 DIAGNOSIS — E7800 Pure hypercholesterolemia, unspecified: Secondary | ICD-10-CM

## 2020-08-20 DIAGNOSIS — K219 Gastro-esophageal reflux disease without esophagitis: Secondary | ICD-10-CM

## 2020-08-20 MED ORDER — ROSUVASTATIN CALCIUM 20 MG PO TABS: 20.0000 mg | ORAL_TABLET | Freq: Every day | ORAL | 3 refills | Status: DC

## 2020-08-20 MED ORDER — FAMOTIDINE 20 MG PO TABS: 20.0000 mg | ORAL_TABLET | Freq: Two times a day (BID) | ORAL | 3 refills | Status: DC

## 2020-08-20 NOTE — Patient Instructions (Addendum)
Please take all new medication as prescribed - the pepcid twice per day  Also, please increase the crestor to 20 mg per day  Please continue all other medications as before, and refills have been done if requested.  Please have the pharmacy call with any other refills you may need.  Please continue your efforts at being more active, low cholesterol diet, and weight control.  You are otherwise up to date with prevention measures today.  Please keep your appointments with your specialists as you may have planned  Please make an Appointment to return in 6 months, or sooner if needed, also with Lab Appointment for testing done 3-5 days before at the [facility] (so this is for TWO appointments - please see the scheduling desk as you leave)  Due to the ongoing Covid 19 pandemic, our lab now requires an appointment for any labs done at our office.  If you need labs done and do not have an appointment, please call our office ahead of time to schedule before presenting to the lab for your testing.

## 2020-08-20 NOTE — Progress Notes (Addendum)
Chief Complaint:: wellness exam and gerd, new ckd, hld uncontrolled and hyperglycemia       HPI:  Tony Savage is a 65 y.o. male here for wellness exam; declines covid vax, shingrix, o/w up to date with preventive referrals and immunizations                        Also has had 2 mo much worsening reflux, but no abd pain, dysphagia, n/v, bowel change or blood, despite working on anti-reflux diet and losing several lbs. Was also some improved with stopping ETOH.  Pt denies no ohterchest pain, increased sob or doe, wheezing, orthopnea, PND, increased LE swelling, palpitations, dizziness or syncope - in fact does stationary bike at home with HR up to 140s without CP several times per wk.  Trying to follow lower chol diet.   Pt denies polydipsia, polyuria, or new focal neuro s/s.   Pt denies fever, night sweats, loss of appetite, or other constitutional symptoms  .     Wt Readings from Last 3 Encounters:  08/20/20 298 lb (135.2 kg)  02/02/20 (!) 303 lb (137.4 kg)  07/26/19 (!) 309 lb 6.4 oz (140.3 kg)   BP Readings from Last 3 Encounters:  08/20/20 138/86  02/02/20 110/80  07/26/19 120/72   Immunization History  Administered Date(s) Administered   Influenza,inj,Quad PF,6+ Mos 11/12/2019   Influenza,inj,Quad PF,6-35 Mos 10/28/2018   Influenza-Unspecified 12/03/2015   PFIZER(Purple Top)SARS-COV-2 Vaccination 04/03/2019, 05/03/2019, 12/31/2019   Td 03/14/2008   Tdap 07/22/2019   There are no preventive care reminders to display for this patient.     Past Medical History:  Diagnosis Date   ABSCESS, GLUTEAL 06/14/2009   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   Anal fissure    BACK PAIN 01/16/2010   Diverticulosis    ERECTILE DYSFUNCTION 12/31/2006   FEVER UNSPECIFIED 06/08/2007   GERD 09/29/2006   GLUCOSE INTOLERANCE 12/31/2006   HEMORRHOIDS 09/29/2006   HYPERLIPIDEMIA 09/29/2006   HYPERSOMNIA 04/30/2009   HYPERTENSION 09/29/2006   Impaired fasting glucose 01/01/2007   KNEE PAIN, LEFT  10/18/2008   LOW BACK PAIN 12/31/2006   OBESITY 09/29/2006   Sleep apnea    cpap   SYNCOPE, VASOVAGAL 03/14/2008   VERTEBRAL FRACTURE 09/29/2006   Past Surgical History:  Procedure Laterality Date   COLONOSCOPY     drainage of recurrent perirectal abceses     fistula-in-ano repair     HEMORRHOID SURGERY      reports that he quit smoking about 23 years ago. His smoking use included cigarettes. He has a 37.50 pack-year smoking history. He has never used smokeless tobacco. He reports current alcohol use of about 14.0 standard drinks of alcohol per week. He reports that he does not use drugs. family history includes Allergies in his sister; Colon cancer (age of onset: 82) in his father; Colon polyps in his brother; Diabetes in his mother; Heart disease in his father; Prostate cancer in his father. No Known Allergies Current Outpatient Medications on File Prior to Visit  Medication Sig Dispense Refill   aspirin 81 MG EC tablet Take 81 mg by mouth daily.     fenofibrate 160 MG tablet TAKE 1 TABLET BY MOUTH EVERY DAY 30 tablet 5   gabapentin (NEURONTIN) 300 MG capsule TAKE 1 CAPSULE(300 MG) BY MOUTH THREE TIMES DAILY 270 capsule 1   pantoprazole (PROTONIX) 40 MG tablet TAKE 1 TABLET(40 MG) BY MOUTH TWICE DAILY 180 tablet 3  traMADol (ULTRAM) 50 MG tablet Take 1 tablet (50 mg total) by mouth every 6 (six) hours as needed. 60 tablet 2   No current facility-administered medications on file prior to visit.        ROS:  All others reviewed and negative.  Objective        PE:  BP 138/86   Pulse 68   Temp 98.4 F (36.9 C) (Oral)   Ht 6' (1.829 m)   Wt 298 lb (135.2 kg)   SpO2 98%   BMI 40.42 kg/m                 Constitutional: Pt appears in NAD               HENT: Head: NCAT.                Right Ear: External ear normal.                 Left Ear: External ear normal.                Eyes: . Pupils are equal, round, and reactive to light. Conjunctivae and EOM are normal               Nose:  without d/c or deformity               Neck: Neck supple. Gross normal ROM               Cardiovascular: Normal rate and regular rhythm.                 Pulmonary/Chest: Effort normal and breath sounds without rales or wheezing.                Abd:  Soft, NT, ND, + BS, no organomegaly               Neurological: Pt is alert. At baseline orientation, motor grossly intact               Skin: Skin is warm. No rashes, no other new lesions, LE edema - none               Psychiatric: Pt behavior is normal without agitation   Micro: none  Cardiac tracings I have personally interpreted today:  none  Pertinent Radiological findings (summarize): none   Lab Results  Component Value Date   WBC 5.8 08/15/2020   HGB 12.7 (L) 08/15/2020   HCT 37.5 (L) 08/15/2020   PLT 219.0 08/15/2020   GLUCOSE 107 (H) 08/15/2020   CHOL 155 08/15/2020   TRIG 127.0 08/15/2020   HDL 48.30 08/15/2020   LDLDIRECT 134.8 04/29/2011   LDLCALC 81 08/15/2020   ALT 20 08/15/2020   AST 24 08/15/2020   NA 139 08/15/2020   K 4.0 08/15/2020   CL 102 08/15/2020   CREATININE 1.52 (H) 08/15/2020   BUN 20 08/15/2020   CO2 26 08/15/2020   TSH 5.07 08/15/2020   PSA 2.38 08/15/2020   HGBA1C 5.1 08/15/2020   MICROALBUR 0.7 07/25/2015   Assessment/Plan:  Tony Savage is a 65 y.o. Black or African American [2] male with  has a past medical history of ABSCESS, GLUTEAL (06/14/2009), Acute gouty arthropathy (10/18/2008), Anal fissure, BACK PAIN (01/16/2010), Diverticulosis, ERECTILE DYSFUNCTION (12/31/2006), FEVER UNSPECIFIED (06/08/2007), GERD (09/29/2006), GLUCOSE INTOLERANCE (12/31/2006), HEMORRHOIDS (09/29/2006), HYPERLIPIDEMIA (09/29/2006), HYPERSOMNIA (04/30/2009), HYPERTENSION (09/29/2006), Impaired fasting glucose (01/01/2007), KNEE PAIN, LEFT (10/18/2008), LOW BACK PAIN (12/31/2006), OBESITY (09/29/2006), Sleep  apnea, SYNCOPE, VASOVAGAL (03/14/2008), and VERTEBRAL FRACTURE (09/29/2006).  Encounter for well adult exam with abnormal  findings Age and sex appropriate education and counseling updated with regular exercise and diet Referrals for preventative services - none needed Immunizations addressed - declines covid booster and shingrix Smoking counseling  - none needed Evidence for depression or other mood disorder - none significant Most recent labs reviewed. I have personally reviewed and have noted: 1) the patient's medical and social history 2) The patient's current medications and supplements 3) The patient's height, weight, and BMI have been recorded in the chart   Impaired glucose tolerance Lab Results  Component Value Date   HGBA1C 5.1 08/15/2020   Stable, pt to continue current medical treatment  - diet, wt control   Hyperlipidemia Lab Results  Component Value Date   [place] 81 08/15/2020   Uncontrolled, goal ldl < 70, pt to increase crestor to 20 mg qd,  to f/u any worsening symptoms or concerns, cont low chol diet   GERD Symptomatically uncontrolled - for add pepcid 20 bid, continue Protonix, cont anti-reflux diet, consider GI referral if not improved  Essential hypertension BP Readings from Last 3 Encounters:  08/20/20 138/86  02/02/20 110/80  07/26/19 120/72   Stable, pt to continue medical treatment zestoretic   CKD (chronic kidney disease) stage 3, GFR 30-59 ml/min ([place]) New worsening,  Lab Results  Component Value Date   CREATININE 1.52 (H) 08/15/2020   Stable overall, cont to avoid nephrotoxins  Followup: Return in about 6 months (around 02/20/2021).  Cathlean Cower, MD 08/26/2020 11:30 AM [facility]

## 2020-08-23 ENCOUNTER — Other Ambulatory Visit: Payer: Self-pay | Admitting: Internal Medicine

## 2020-08-26 ENCOUNTER — Encounter: Payer: Self-pay | Admitting: Internal Medicine

## 2020-08-26 DIAGNOSIS — N183 Chronic kidney disease, stage 3 unspecified: Secondary | ICD-10-CM | POA: Insufficient documentation

## 2020-08-26 NOTE — Assessment & Plan Note (Signed)
New worsening,  Lab Results  Component Value Date   CREATININE 1.52 (H) 08/15/2020   Stable overall, cont to avoid nephrotoxins

## 2020-08-26 NOTE — Assessment & Plan Note (Addendum)
Symptomatically uncontrolled - for add pepcid 20 bid, continue Protonix, cont anti-reflux diet, consider GI referral if not improved

## 2020-08-26 NOTE — Assessment & Plan Note (Signed)
BP Readings from Last 3 Encounters:  08/20/20 138/86  02/02/20 110/80  07/26/19 120/72   Stable, pt to continue medical treatment zestoretic

## 2020-08-26 NOTE — Assessment & Plan Note (Signed)
Age and sex appropriate education and counseling updated with regular exercise and diet Referrals for preventative services - none needed Immunizations addressed - declines covid booster and shingrix Smoking counseling  - none needed Evidence for depression or other mood disorder - none significant Most recent labs reviewed. I have personally reviewed and have noted: 1) the patient's medical and social history 2) The patient's current medications and supplements 3) The patient's height, weight, and BMI have been recorded in the chart  

## 2020-08-26 NOTE — Assessment & Plan Note (Signed)
Lab Results  Component Value Date   HGBA1C 5.1 08/15/2020   Stable, pt to continue current medical treatment  - diet, wt control

## 2020-08-26 NOTE — Assessment & Plan Note (Signed)
Lab Results  Component Value Date   LDLCALC 81 08/15/2020   Uncontrolled, goal ldl < 70, pt to increase crestor to 20 mg qd,  to f/u any worsening symptoms or concerns, cont low chol diet

## 2020-08-30 ENCOUNTER — Telehealth: Payer: Self-pay | Admitting: Internal Medicine

## 2020-08-30 MED ORDER — NIRMATRELVIR/RITONAVIR (PAXLOVID) TABLET (RENAL DOSING): 2.0000 | ORAL_TABLET | Freq: Two times a day (BID) | ORAL | 0 refills | Status: AC

## 2020-08-30 NOTE — Telephone Encounter (Signed)
Patient tested covid+, home test 8/4 Patient reports runny nose, no other symptoms  Seeking advice

## 2020-08-30 NOTE — Telephone Encounter (Signed)
Elsa for [employer]  - done erx to [address]

## 2020-09-27 ENCOUNTER — Other Ambulatory Visit: Payer: Self-pay | Admitting: Internal Medicine

## 2020-09-27 NOTE — Telephone Encounter (Signed)
[facility] for routine fenofibrate only - thanks

## 2020-12-27 DIAGNOSIS — H269 Unspecified cataract: Secondary | ICD-10-CM

## 2020-12-27 HISTORY — DX: Unspecified cataract: H26.9

## 2021-01-28 ENCOUNTER — Other Ambulatory Visit: Payer: Self-pay | Admitting: Internal Medicine

## 2021-02-03 ENCOUNTER — Other Ambulatory Visit: Payer: Self-pay | Admitting: Internal Medicine

## 2021-02-03 NOTE — Telephone Encounter (Signed)
Please refill as per office routine med refill policy (all routine meds to be refilled for 3 mo or monthly (per pt preference) up to one year from last visit, then month to month grace period for 3 mo, then further med refills will have to be denied) ? ?

## 2021-02-18 ENCOUNTER — Other Ambulatory Visit (INDEPENDENT_AMBULATORY_CARE_PROVIDER_SITE_OTHER): Payer: 59

## 2021-02-18 DIAGNOSIS — E7800 Pure hypercholesterolemia, unspecified: Secondary | ICD-10-CM

## 2021-02-18 DIAGNOSIS — R7302 Impaired glucose tolerance (oral): Secondary | ICD-10-CM | POA: Diagnosis not present

## 2021-02-18 LAB — HEPATIC FUNCTION PANEL
ALT: 15 U/L (ref 0–53)
AST: 22 U/L (ref 0–37)
Albumin: 4.3 g/dL (ref 3.5–5.2)
Alkaline Phosphatase: 77 U/L (ref 39–117)
Bilirubin, Direct: 0.2 mg/dL (ref 0.0–0.3)
Total Bilirubin: 0.9 mg/dL (ref 0.2–1.2)
Total Protein: 6.8 g/dL (ref 6.0–8.3)

## 2021-02-18 LAB — BASIC METABOLIC PANEL
BUN: 27 mg/dL — ABNORMAL HIGH (ref 6–23)
CO2: 27 mEq/L (ref 19–32)
Calcium: 9.5 mg/dL (ref 8.4–10.5)
Chloride: 103 mEq/L (ref 96–112)
Creatinine, Ser: 1.59 mg/dL — ABNORMAL HIGH (ref 0.40–1.50)
GFR: 45.84 mL/min — ABNORMAL LOW (ref >60.00)
Glucose, Bld: 103 mg/dL — ABNORMAL HIGH (ref 70–99)
Potassium: 3.6 mEq/L (ref 3.5–5.1)
Sodium: 140 mEq/L (ref 135–145)

## 2021-02-18 LAB — CBC WITH DIFFERENTIAL/PLATELET
Basophils Absolute: 0 10*3/uL (ref 0.0–0.1)
Basophils Relative: 0.7 % (ref 0.0–3.0)
Eosinophils Absolute: 0.2 10*3/uL (ref 0.0–0.7)
Eosinophils Relative: 3.3 % (ref 0.0–5.0)
HCT: 34.4 % — ABNORMAL LOW (ref 39.0–52.0)
Hemoglobin: 11.2 g/dL — ABNORMAL LOW (ref 13.0–17.0)
Lymphocytes Relative: 29.3 % (ref 12.0–46.0)
Lymphs Abs: 1.8 10*3/uL (ref 0.7–4.0)
MCHC: 32.7 g/dL (ref 30.0–36.0)
MCV: 99.4 fl (ref 78.0–100.0)
Monocytes Absolute: 0.6 10*3/uL (ref 0.1–1.0)
Monocytes Relative: 9.9 % (ref 3.0–12.0)
Neutro Abs: 3.5 10*3/uL (ref 1.4–7.7)
Neutrophils Relative %: 56.8 % (ref 43.0–77.0)
Platelets: 204 10*3/uL (ref 150.0–400.0)
RBC: 3.46 Mil/uL — ABNORMAL LOW (ref 4.22–5.81)
RDW: 13.6 % (ref 11.5–15.5)
WBC: 6.1 10*3/uL (ref 4.0–10.5)

## 2021-02-18 LAB — LIPID PANEL
Cholesterol: 133 mg/dL (ref 0–200)
HDL: 56.1 mg/dL (ref >39.00)
LDL Cholesterol: 61 mg/dL (ref 0–99)
NonHDL: 76.5
Total CHOL/HDL Ratio: 2
Triglycerides: 80 mg/dL (ref 0.0–149.0)
VLDL: 16 mg/dL (ref 0.0–40.0)

## 2021-02-18 LAB — HEMOGLOBIN A1C: Hgb A1c MFr Bld: 4.9 % (ref 4.6–6.5)

## 2021-02-21 ENCOUNTER — Other Ambulatory Visit: Payer: Self-pay

## 2021-02-21 ENCOUNTER — Ambulatory Visit: Payer: 59 | Admitting: Internal Medicine

## 2021-02-21 ENCOUNTER — Encounter: Payer: Self-pay | Admitting: Internal Medicine

## 2021-02-21 VITALS — BP 128/72 | HR 61 | Ht 72.0 in | Wt 290.0 lb

## 2021-02-21 DIAGNOSIS — E559 Vitamin D deficiency, unspecified: Secondary | ICD-10-CM

## 2021-02-21 DIAGNOSIS — G8929 Other chronic pain: Secondary | ICD-10-CM

## 2021-02-21 DIAGNOSIS — M5441 Lumbago with sciatica, right side: Secondary | ICD-10-CM

## 2021-02-21 DIAGNOSIS — I10 Essential (primary) hypertension: Secondary | ICD-10-CM

## 2021-02-21 DIAGNOSIS — N1831 Chronic kidney disease, stage 3a: Secondary | ICD-10-CM

## 2021-02-21 DIAGNOSIS — E538 Deficiency of other specified B group vitamins: Secondary | ICD-10-CM | POA: Diagnosis not present

## 2021-02-21 DIAGNOSIS — R7302 Impaired glucose tolerance (oral): Secondary | ICD-10-CM

## 2021-02-21 DIAGNOSIS — Z0001 Encounter for general adult medical examination with abnormal findings: Secondary | ICD-10-CM

## 2021-02-21 DIAGNOSIS — M48062 Spinal stenosis, lumbar region with neurogenic claudication: Secondary | ICD-10-CM

## 2021-02-21 DIAGNOSIS — K219 Gastro-esophageal reflux disease without esophagitis: Secondary | ICD-10-CM

## 2021-02-21 DIAGNOSIS — E7800 Pure hypercholesterolemia, unspecified: Secondary | ICD-10-CM

## 2021-02-21 NOTE — Patient Instructions (Signed)
OK to take the OTC TUMS or [place] for breakthrough reflux as needed  Ok to stop the gabapentin  Please continue all other medications as before, and refills have been done if requested.  Please have the pharmacy call with any other refills you may need.  Please continue your efforts at being more active, low cholesterol diet, and weight control.  You are otherwise up to date with prevention measures today.  Please keep your appointments with your specialists as you may have planned  Please make an Appointment to return in 6 months, or sooner if needed, also with Lab Appointment for testing done 3-5 days before at the [facility] (so this is for TWO appointments - please see the scheduling desk as you leave)  Due to the ongoing Covid 19 pandemic, our lab now requires an appointment for any labs done at our office.  If you need labs done and do not have an appointment, please call our office ahead of time to schedule before presenting to the lab for your testing.

## 2021-02-21 NOTE — Progress Notes (Signed)
Patient ID: Tony Savage, male   DOB: [birth year], 66 y.o.   MRN: [id number]         Chief Complaint:: wellness exam and Follow-up  Tony Savage, chronic lbp, ckd, hld       HPI:  Tony Savage is a 66 y.o. male here for wellness exam; declines covid booster, shingrix, o/w up to date                        Also wt down to 290 with more bicycle lately.  Pt denies chest pain, increased sob or doe, wheezing, orthopnea, PND, increased LE swelling, palpitations, dizziness or syncope.   Pt denies polydipsia, polyuria, or new focal neuro s/s.  Despite some wt loss, has has mild worsening reflux and upper abd pain,but no dysphagia, n/v, bowel change or blood for several months, no radiation to the back, only take protonix on occasion.  Pt continues to have recurring LBP without change in severity, bowel or bladder change, fever, wt loss,  worsening LE pain/numbness/weakness, gait change or falls, and overall improved recently, asks to hold on taking further gabapentin for now.   Pt denies fever, night sweats, loss of appetite, or other constitutional symptoms.     Wt Readings from Last 3 Encounters:  02/21/21 290 lb (131.5 kg)  08/20/20 298 lb (135.2 kg)  02/02/20 (!) 303 lb (137.4 kg)   BP Readings from Last 3 Encounters:  02/21/21 128/72  08/20/20 138/86  02/02/20 110/80   Immunization History  Administered Date(s) Administered   Influenza,inj,Quad PF,6+ Mos 11/12/2019   Influenza,inj,Quad PF,6-35 Mos 10/28/2018   Influenza-Unspecified 12/03/2015, 01/07/2021   PFIZER(Purple Top)SARS-COV-2 Vaccination 04/03/2019, 05/03/2019, 12/31/2019   Td 03/14/2008   Tdap 07/22/2019   There are no preventive care reminders to display for this patient.     Past Medical History:  Diagnosis Date   ABSCESS, GLUTEAL 06/14/2009   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   Anal fissure    BACK PAIN 01/16/2010   Diverticulosis    ERECTILE DYSFUNCTION 12/31/2006   FEVER UNSPECIFIED 06/08/2007   GERD 09/29/2006    GLUCOSE INTOLERANCE 12/31/2006   HEMORRHOIDS 09/29/2006   HYPERLIPIDEMIA 09/29/2006   HYPERSOMNIA 04/30/2009   HYPERTENSION 09/29/2006   Impaired fasting glucose 01/01/2007   KNEE PAIN, LEFT 10/18/2008   LOW BACK PAIN 12/31/2006   OBESITY 09/29/2006   Sleep apnea    cpap   SYNCOPE, VASOVAGAL 03/14/2008   VERTEBRAL FRACTURE 09/29/2006   Past Surgical History:  Procedure Laterality Date   COLONOSCOPY     drainage of recurrent perirectal abceses     fistula-in-ano repair     HEMORRHOID SURGERY      reports that he quit smoking about 23 years ago. His smoking use included cigarettes. He has a 37.50 pack-year smoking history. He has never used smokeless tobacco. He reports current alcohol use of about 14.0 standard drinks per week. He reports that he does not use drugs. family history includes Allergies in his sister; Colon cancer (age of onset: 78) in his father; Colon polyps in his brother; Diabetes in his mother; Heart disease in his father; Prostate cancer in his father. No Known Allergies Current Outpatient Medications on File Prior to Visit  Medication Sig Dispense Refill   aspirin 81 MG EC tablet Take 81 mg by mouth daily.     famotidine (PEPCID) 20 MG tablet Take 1 tablet (20 mg total) by mouth 2 (two) times daily. 180 tablet 3   fenofibrate  160 MG tablet TAKE 1 TABLET BY MOUTH EVERY DAY 30 tablet 5   lisinopril-hydrochlorothiazide (ZESTORETIC) 20-12.5 MG tablet TAKE 2 TABLETS BY MOUTH DAILY 180 tablet 1   pantoprazole (PROTONIX) 40 MG tablet TAKE 1 TABLET(40 MG) BY MOUTH TWICE DAILY 180 tablet 3   prednisoLONE acetate (PRED FORTE) 1 % ophthalmic suspension Place 1 drop into the left eye 4 (four) times daily.     rosuvastatin (CRESTOR) 20 MG tablet Take 1 tablet (20 mg total) by mouth daily. 90 tablet 3   traMADol (ULTRAM) 50 MG tablet Take 1 tablet (50 mg total) by mouth every 6 (six) hours as needed. 60 tablet 2   No current facility-administered medications on file prior to visit.         ROS:  All others reviewed and negative.  Objective        PE:  BP 128/72 (BP Location: Left Arm, Patient Position: Sitting, Cuff Size: Large)    Pulse 61    Ht 6' (1.829 m)    Wt 290 lb (131.5 kg)    SpO2 99%    BMI 39.33 kg/m                 Constitutional: Pt appears in NAD               HENT: Head: NCAT.                Right Ear: External ear normal.                 Left Ear: External ear normal.                Eyes: . Pupils are equal, round, and reactive to light. Conjunctivae and EOM are normal               Nose: without d/c or deformity               Neck: Neck supple. Gross normal ROM               Cardiovascular: Normal rate and regular rhythm.                 Pulmonary/Chest: Effort normal and breath sounds without rales or wheezing.                Abd:  Soft, NT, ND, + BS, no organomegaly               Neurological: Pt is alert. At baseline orientation, motor grossly intact               Skin: Skin is warm. No rashes, no other new lesions, LE edema - none               Psychiatric: Pt behavior is normal without agitation   Micro: none  Cardiac tracings I have personally interpreted today:  none  Pertinent Radiological findings (summarize): none   Lab Results  Component Value Date   WBC 6.1 02/18/2021   HGB 11.2 (L) 02/18/2021   HCT 34.4 (L) 02/18/2021   PLT 204.0 02/18/2021   GLUCOSE 103 (H) 02/18/2021   CHOL 133 02/18/2021   TRIG 80.0 02/18/2021   HDL 56.10 02/18/2021   LDLDIRECT 134.8 04/29/2011   LDLCALC 61 02/18/2021   ALT 15 02/18/2021   AST 22 02/18/2021   NA 140 02/18/2021   K 3.6 02/18/2021   CL 103 02/18/2021   CREATININE 1.59 (H) 02/18/2021   BUN 27 (  H) 02/18/2021   CO2 27 02/18/2021   TSH 5.07 08/15/2020   PSA 2.38 08/15/2020   HGBA1C 4.9 02/18/2021   MICROALBUR 0.7 07/25/2015   Assessment/Plan:  Tony Savage is a 66 y.o. Black or African American [2] male with  has a past medical history of ABSCESS, GLUTEAL (06/14/2009), Acute gouty  arthropathy (10/18/2008), Anal fissure, BACK PAIN (01/16/2010), Diverticulosis, ERECTILE DYSFUNCTION (12/31/2006), FEVER UNSPECIFIED (06/08/2007), GERD (09/29/2006), GLUCOSE INTOLERANCE (12/31/2006), HEMORRHOIDS (09/29/2006), HYPERLIPIDEMIA (09/29/2006), HYPERSOMNIA (04/30/2009), HYPERTENSION (09/29/2006), Impaired fasting glucose (01/01/2007), KNEE PAIN, LEFT (10/18/2008), LOW BACK PAIN (12/31/2006), OBESITY (09/29/2006), Sleep apnea, SYNCOPE, VASOVAGAL (03/14/2008), and VERTEBRAL FRACTURE (09/29/2006).  Chronic low back pain With some recent symptomatic improement, ok to hold on taking gabapentin for now  Encounter for well adult exam with abnormal findings Age and sex appropriate education and counseling updated with regular exercise and diet Referrals for preventative services - none needed Immunizations addressed - declines covid booster, shingrix Smoking counseling  - none needed Evidence for depression or other mood disorder - none significant Most recent labs reviewed. I have personally reviewed and have noted: 1) the patient's medical and social history 2) The patient's current medications and supplements 3) The patient's height, weight, and BMI have been recorded in the chart   CKD (chronic kidney disease) stage 3, GFR 30-59 ml/min (HCC) Lab Results  Component Value Date   CREATININE 1.59 (H) 02/18/2021   Stable overall, cont to avoid nephrotoxins   Essential hypertension BP Readings from Last 3 Encounters:  02/21/21 128/72  08/20/20 138/86  02/02/20 110/80   Stable, pt to continue medical treatment zestoretic   GERD With mild recent worsening uncontrolled, to take the PPI daily, TUMS prn, anti reflux diet and continue wt control efforts  Hyperlipidemia . Lab Results  Component Value Date   LDLCALC 61 02/18/2021   Stable, pt to continue current statin crestor   Impaired glucose tolerance Lab Results  Component Value Date   HGBA1C 4.9 02/18/2021   Stable, pt to continue current  medical treatment  - diet  Followup: Return in about 6 months (around 08/21/2021).  Cathlean Cower, MD 02/23/2021 7:41 AM [facility]

## 2021-02-23 ENCOUNTER — Encounter: Payer: Self-pay | Admitting: Internal Medicine

## 2021-02-23 NOTE — Assessment & Plan Note (Signed)
. °  Lab Results  Component Value Date   LDLCALC 61 02/18/2021   Stable, pt to continue current statin crestor

## 2021-02-23 NOTE — Assessment & Plan Note (Signed)
Lab Results  Component Value Date   CREATININE 1.59 (H) 02/18/2021   Stable overall, cont to avoid nephrotoxins

## 2021-02-23 NOTE — Assessment & Plan Note (Signed)
BP Readings from Last 3 Encounters:  02/21/21 128/72  08/20/20 138/86  02/02/20 110/80   Stable, pt to continue medical treatment zestoretic

## 2021-02-23 NOTE — Assessment & Plan Note (Signed)
With mild recent worsening uncontrolled, to take the PPI daily, TUMS prn, anti reflux diet and continue wt control efforts

## 2021-02-23 NOTE — Assessment & Plan Note (Signed)

## 2021-02-23 NOTE — Assessment & Plan Note (Signed)
Lab Results  Component Value Date   HGBA1C 4.9 02/18/2021   Stable, pt to continue current medical treatment  - diet

## 2021-02-23 NOTE — Assessment & Plan Note (Signed)
With some recent symptomatic improement, ok to hold on taking gabapentin for now

## 2021-03-26 ENCOUNTER — Other Ambulatory Visit: Payer: Self-pay | Admitting: Internal Medicine

## 2021-03-26 NOTE — Telephone Encounter (Signed)
Please refill as per office routine med refill policy (all routine meds to be refilled for 3 mo or monthly (per pt preference) up to one year from last visit, then month to month grace period for 3 mo, then further med refills will have to be denied) ? ?

## 2021-05-17 ENCOUNTER — Other Ambulatory Visit: Payer: Self-pay | Admitting: Internal Medicine

## 2021-05-17 NOTE — Telephone Encounter (Signed)
Please refill as per office routine med refill policy (all routine meds to be refilled for 3 mo or monthly (per pt preference) up to one year from last visit, then month to month grace period for 3 mo, then further med refills will have to be denied) ? ?

## 2021-07-05 ENCOUNTER — Other Ambulatory Visit: Payer: Self-pay | Admitting: Internal Medicine

## 2021-07-05 ENCOUNTER — Encounter: Payer: Self-pay | Admitting: Internal Medicine

## 2021-07-05 ENCOUNTER — Ambulatory Visit: Payer: 59 | Admitting: Internal Medicine

## 2021-07-05 VITALS — BP 124/70 | HR 65 | Temp 97.7°F | Ht 72.0 in | Wt 287.0 lb

## 2021-07-05 DIAGNOSIS — E7800 Pure hypercholesterolemia, unspecified: Secondary | ICD-10-CM | POA: Diagnosis not present

## 2021-07-05 DIAGNOSIS — E538 Deficiency of other specified B group vitamins: Secondary | ICD-10-CM | POA: Diagnosis not present

## 2021-07-05 DIAGNOSIS — Z0001 Encounter for general adult medical examination with abnormal findings: Secondary | ICD-10-CM

## 2021-07-05 DIAGNOSIS — N1831 Chronic kidney disease, stage 3a: Secondary | ICD-10-CM | POA: Diagnosis not present

## 2021-07-05 DIAGNOSIS — R000 Tachycardia, unspecified: Secondary | ICD-10-CM | POA: Diagnosis not present

## 2021-07-05 DIAGNOSIS — E559 Vitamin D deficiency, unspecified: Secondary | ICD-10-CM | POA: Diagnosis not present

## 2021-07-05 DIAGNOSIS — R7302 Impaired glucose tolerance (oral): Secondary | ICD-10-CM | POA: Diagnosis not present

## 2021-07-05 DIAGNOSIS — R972 Elevated prostate specific antigen [PSA]: Secondary | ICD-10-CM

## 2021-07-05 DIAGNOSIS — Z860100 Personal history of colon polyps, unspecified: Secondary | ICD-10-CM | POA: Insufficient documentation

## 2021-07-05 DIAGNOSIS — R002 Palpitations: Secondary | ICD-10-CM | POA: Insufficient documentation

## 2021-07-05 DIAGNOSIS — I10 Essential (primary) hypertension: Secondary | ICD-10-CM | POA: Diagnosis not present

## 2021-07-05 LAB — URINALYSIS, ROUTINE W REFLEX MICROSCOPIC
Bilirubin Urine: NEGATIVE
Hgb urine dipstick: NEGATIVE
Ketones, ur: NEGATIVE
Leukocytes,Ua: NEGATIVE
Nitrite: NEGATIVE
RBC / HPF: NONE SEEN (ref 0–?)
Specific Gravity, Urine: 1.02 (ref 1.000–1.030)
Total Protein, Urine: NEGATIVE
Urine Glucose: NEGATIVE
Urobilinogen, UA: 1 (ref 0.0–1.0)
pH: 5.5 (ref 5.0–8.0)

## 2021-07-05 LAB — PSA: PSA: 7.2 ng/mL — ABNORMAL HIGH (ref 0.10–4.00)

## 2021-07-05 LAB — CBC WITH DIFFERENTIAL/PLATELET
Basophils Absolute: 0 10*3/uL (ref 0.0–0.1)
Basophils Relative: 0.7 % (ref 0.0–3.0)
Eosinophils Absolute: 0.2 10*3/uL (ref 0.0–0.7)
Eosinophils Relative: 2.5 % (ref 0.0–5.0)
HCT: 35.9 % — ABNORMAL LOW (ref 39.0–52.0)
Hemoglobin: 11.9 g/dL — ABNORMAL LOW (ref 13.0–17.0)
Lymphocytes Relative: 27.3 % (ref 12.0–46.0)
Lymphs Abs: 1.7 10*3/uL (ref 0.7–4.0)
MCHC: 33 g/dL (ref 30.0–36.0)
MCV: 99.9 fl (ref 78.0–100.0)
Monocytes Absolute: 0.6 10*3/uL (ref 0.1–1.0)
Monocytes Relative: 10.3 % (ref 3.0–12.0)
Neutro Abs: 3.7 10*3/uL (ref 1.4–7.7)
Neutrophils Relative %: 59.2 % (ref 43.0–77.0)
Platelets: 203 10*3/uL (ref 150.0–400.0)
RBC: 3.6 Mil/uL — ABNORMAL LOW (ref 4.22–5.81)
RDW: 13.1 % (ref 11.5–15.5)
WBC: 6.2 10*3/uL (ref 4.0–10.5)

## 2021-07-05 LAB — TSH: TSH: 2.18 u[IU]/mL (ref 0.35–5.50)

## 2021-07-05 LAB — LIPID PANEL
Cholesterol: 135 mg/dL (ref 0–200)
HDL: 56.2 mg/dL (ref >39.00)
LDL Cholesterol: 62 mg/dL (ref 0–99)
NonHDL: 78.68
Total CHOL/HDL Ratio: 2
Triglycerides: 85 mg/dL (ref 0.0–149.0)
VLDL: 17 mg/dL (ref 0.0–40.0)

## 2021-07-05 LAB — VITAMIN B12: Vitamin B-12: 317 pg/mL (ref 211–911)

## 2021-07-05 LAB — HEPATIC FUNCTION PANEL
ALT: 22 U/L (ref 0–53)
AST: 29 U/L (ref 0–37)
Albumin: 4.5 g/dL (ref 3.5–5.2)
Alkaline Phosphatase: 81 U/L (ref 39–117)
Bilirubin, Direct: 0.3 mg/dL (ref 0.0–0.3)
Total Bilirubin: 1 mg/dL (ref 0.2–1.2)
Total Protein: 7.3 g/dL (ref 6.0–8.3)

## 2021-07-05 LAB — MICROALBUMIN / CREATININE URINE RATIO
Creatinine,U: 106.1 mg/dL
Microalb Creat Ratio: 0.7 mg/g (ref 0.0–30.0)
Microalb, Ur: 0.8 mg/dL (ref 0.0–1.9)

## 2021-07-05 LAB — VITAMIN D 25 HYDROXY (VIT D DEFICIENCY, FRACTURES): VITD: 72.74 ng/mL (ref 30.00–100.00)

## 2021-07-05 LAB — BASIC METABOLIC PANEL
BUN: 23 mg/dL (ref 6–23)
CO2: 27 mEq/L (ref 19–32)
Calcium: 10 mg/dL (ref 8.4–10.5)
Chloride: 100 mEq/L (ref 96–112)
Creatinine, Ser: 1.54 mg/dL — ABNORMAL HIGH (ref 0.40–1.50)
GFR: 47.5 mL/min — ABNORMAL LOW (ref >60.00)
Glucose, Bld: 93 mg/dL (ref 70–99)
Potassium: 3.8 mEq/L (ref 3.5–5.1)
Sodium: 138 mEq/L (ref 135–145)

## 2021-07-05 LAB — HEMOGLOBIN A1C: Hgb A1c MFr Bld: 5 % (ref 4.6–6.5)

## 2021-07-05 MED ORDER — DOXYCYCLINE HYCLATE 100 MG PO TABS: 100.0000 mg | ORAL_TABLET | Freq: Two times a day (BID) | ORAL | 0 refills | Status: AC

## 2021-07-05 NOTE — Patient Instructions (Addendum)
Please check at the pharmacy about the Shingles shot  You will be contacted regarding the referral for: visit with Dr [name] Plan due to the complicated nature of follow up for the previous colon polyp  You will be contacted regarding the referral for: Cardiac Event Monitor for 4 weeks  Please continue all other medications as before, and refills have been done if requested.  Please have the pharmacy call with any other refills you may need.  Please continue your efforts at being more active, low cholesterol diet, and weight control.  You are otherwise up to date with prevention measures today.  Please keep your appointments with your specialists as you may have planned  Please go to the LAB at the blood drawing area for the tests to be done  You will be contacted by phone if any changes need to be made immediately.  Otherwise, you will receive a letter about your results with an explanation, but please check with MyChart first.  Please remember to sign up for MyChart if you have not done so, as this will be important to you in the future with finding out test results, communicating by private email, and scheduling acute appointments online when needed.  Ok to follow up in July 2023 as planned

## 2021-07-05 NOTE — Progress Notes (Unsigned)
Patient ID: Tony Savage, male   DOB: 03-14-1955, 66 y.o.   MRN: [id number]        Chief Complaint: follow up HTN, tachycardia, ckd, hyperclyemia, hx of colon polyp       HPI:  Tony Savage is a 66 y.o. male here to f/u, BP at home last wk was in the 180s for unclear reason, and improved again this wk.  Pt denies chest pain, increased sob or doe, wheezing, orthopnea, PND, increased LE swelling, palpitations, dizziness or syncope, though has noted elevated HR at home in what sounds like discrete episodes with random start/stop to 140's, last minutes only usually, hard to say how often maybe once or a few times per month, ,but sometimes less as well in the past several yrs.   Pt denies polydipsia, polyuria, or new focal neuro s/s.    Pt denies fever, wt loss, night sweats, loss of appetite, or other constitutional symptoms  Ride stat bike every am 30 min but no CP, decliens stress test.  Has been losing wt to 287 from peak of 303 at home.  Has less back pain and RLE numbness since more excerise and losing wt.  Trying to avoid surgury per ortho for DDD.  Also has hx of colon polyp, asks for referral to Dr [name] Plan GI as last was complicated.   Wt Readings from Last 3 Encounters:  07/05/21 287 lb (130.2 kg)  02/21/21 290 lb (131.5 kg)  08/20/20 298 lb (135.2 kg)   BP Readings from Last 3 Encounters:  07/05/21 124/70  02/21/21 128/72  08/20/20 138/86         Past Medical History:  Diagnosis Date   ABSCESS, GLUTEAL 06/14/2009   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   Anal fissure    BACK PAIN 01/16/2010   Diverticulosis    ERECTILE DYSFUNCTION 12/31/2006   FEVER UNSPECIFIED 06/08/2007   GERD 09/29/2006   GLUCOSE INTOLERANCE 12/31/2006   HEMORRHOIDS 09/29/2006   HYPERLIPIDEMIA 09/29/2006   HYPERSOMNIA 04/30/2009   HYPERTENSION 09/29/2006   Impaired fasting glucose 01/01/2007   KNEE PAIN, LEFT 10/18/2008   LOW BACK PAIN 12/31/2006   OBESITY 09/29/2006   Sleep apnea    cpap   SYNCOPE, VASOVAGAL 03/14/2008    VERTEBRAL FRACTURE 09/29/2006   Past Surgical History:  Procedure Laterality Date   COLONOSCOPY     drainage of recurrent perirectal abceses     fistula-in-ano repair     HEMORRHOID SURGERY      reports that he quit smoking about 24 years ago. His smoking use included cigarettes. He has a 37.50 pack-year smoking history. He has never used smokeless tobacco. He reports current alcohol use of about 14.0 standard drinks of alcohol per week. He reports that he does not use drugs. family history includes Allergies in his sister; Colon cancer (age of onset: 61) in his father; Colon polyps in his brother; Diabetes in his mother; Heart disease in his father; Prostate cancer in his father. No Known Allergies Current Outpatient Medications on File Prior to Visit  Medication Sig Dispense Refill   aspirin 81 MG EC tablet Take 81 mg by mouth daily.     fenofibrate 160 MG tablet TAKE 1 TABLET BY MOUTH EVERY DAY 90 tablet 3   gabapentin (NEURONTIN) 300 MG capsule Take 300 mg by mouth 3 (three) times daily.     lisinopril-hydrochlorothiazide (ZESTORETIC) 20-12.5 MG tablet TAKE 2 TABLETS BY MOUTH DAILY 180 tablet 1   pantoprazole (PROTONIX) 40 MG tablet TAKE  1 TABLET(40 MG) BY MOUTH TWICE DAILY 180 tablet 3   prednisoLONE acetate (PRED FORTE) 1 % ophthalmic suspension Place 1 drop into the left eye 4 (four) times daily.     rosuvastatin (CRESTOR) 20 MG tablet Take 1 tablet (20 mg total) by mouth daily. 90 tablet 3   traMADol (ULTRAM) 50 MG tablet Take 1 tablet (50 mg total) by mouth every 6 (six) hours as needed. 60 tablet 2   No current facility-administered medications on file prior to visit.        ROS:  All others reviewed and negative.  Objective        PE:  BP 124/70 (BP Location: Right Arm, Patient Position: Sitting, Cuff Size: Large)   Pulse 65   Temp 97.7 F (36.5 C) (Oral)   Ht 6' (1.829 m)   Wt 287 lb (130.2 kg)   SpO2 100%   BMI 38.92 kg/m                 Constitutional: Pt appears  in NAD               HENT: Head: NCAT.                Right Ear: External ear normal.                 Left Ear: External ear normal.                Eyes: . Pupils are equal, round, and reactive to light. Conjunctivae and EOM are normal               Nose: without d/c or deformity               Neck: Neck supple. Gross normal ROM               Cardiovascular: Normal rate and regular rhythm.                 Pulmonary/Chest: Effort normal and breath sounds without rales or wheezing.                Abd:  Soft, NT, ND, + BS, no organomegaly               Neurological: Pt is alert. At baseline orientation, motor grossly intact               Skin: Skin is warm. No rashes, no other new lesions, LE edema - none               Psychiatric: Pt behavior is normal without agitation   Micro: none  Cardiac tracings I have personally interpreted today:  none  Pertinent Radiological findings (summarize): none   Lab Results  Component Value Date   WBC 6.2 07/05/2021   HGB 11.9 (L) 07/05/2021   HCT 35.9 (L) 07/05/2021   PLT 203.0 07/05/2021   GLUCOSE 93 07/05/2021   CHOL 135 07/05/2021   TRIG 85.0 07/05/2021   HDL 56.20 07/05/2021   LDLDIRECT 134.8 04/29/2011   LDLCALC 62 07/05/2021   ALT 22 07/05/2021   AST 29 07/05/2021   NA 138 07/05/2021   K 3.8 07/05/2021   CL 100 07/05/2021   CREATININE 1.54 (H) 07/05/2021   BUN 23 07/05/2021   CO2 27 07/05/2021   TSH 2.18 07/05/2021   PSA 7.20 (H) 07/05/2021   HGBA1C 5.0 07/05/2021   MICROALBUR 0.8 07/05/2021   Assessment/Plan:  Tony Savage is  a 66 y.o. Black or African American [2] male with  has a past medical history of ABSCESS, GLUTEAL (06/14/2009), Acute gouty arthropathy (10/18/2008), Anal fissure, BACK PAIN (01/16/2010), Diverticulosis, ERECTILE DYSFUNCTION (12/31/2006), FEVER UNSPECIFIED (06/08/2007), GERD (09/29/2006), GLUCOSE INTOLERANCE (12/31/2006), HEMORRHOIDS (09/29/2006), HYPERLIPIDEMIA (09/29/2006), HYPERSOMNIA (04/30/2009), HYPERTENSION  (09/29/2006), Impaired fasting glucose (01/01/2007), KNEE PAIN, LEFT (10/18/2008), LOW BACK PAIN (12/31/2006), OBESITY (09/29/2006), Sleep apnea, SYNCOPE, VASOVAGAL (03/14/2008), and VERTEBRAL FRACTURE (09/29/2006).  History of colonic polyps Complicated procedure requirements per pt - for refer OV with Dr [name] Plan to discuss  Tachycardia Etiology unclear, for cardiac Event monitor,  to f/u any worsening symptoms or concerns  Impaired glucose tolerance Lab Results  Component Value Date   HGBA1C 5.0 07/05/2021   Stable, pt to continue current medical treatment  - diet, wt control   Hyperlipidemia Lab Results  Component Value Date   [place] 62 07/05/2021   Stable, pt to continue current statin crestor 20 mg qd   Essential hypertension BP Readings from Last 3 Encounters:  07/05/21 124/70  02/21/21 128/72  08/20/20 138/86   Stable, pt to continue medical treatment lisiniopril hct 20-12.5 qd   CKD (chronic kidney disease) stage 3, GFR 30-59 ml/min (HCC) Lab Results  Component Value Date   CREATININE 1.54 (H) 07/05/2021   Stable overall, cont to avoid nephrotoxins  Palpitations As above  Followup: Return if symptoms worsen or fail to improve.  Cathlean Cower, MD 07/07/2021 2:27 PM [facility]

## 2021-07-05 NOTE — Assessment & Plan Note (Signed)
Complicated procedure requirements per pt - for refer OV with Dr [name] Plan to discuss

## 2021-07-07 ENCOUNTER — Encounter: Payer: Self-pay | Admitting: Internal Medicine

## 2021-07-07 NOTE — Assessment & Plan Note (Signed)
Lab Results  Component Value Date   CREATININE 1.54 (H) 07/05/2021   Stable overall, cont to avoid nephrotoxins

## 2021-07-07 NOTE — Assessment & Plan Note (Signed)
Lab Results  Component Value Date   HGBA1C 5.0 07/05/2021   Stable, pt to continue current medical treatment  - diet, wt control

## 2021-07-07 NOTE — Assessment & Plan Note (Signed)
BP Readings from Last 3 Encounters:  07/05/21 124/70  02/21/21 128/72  08/20/20 138/86   Stable, pt to continue medical treatment lisiniopril hct 20-12.5 qd

## 2021-07-07 NOTE — Assessment & Plan Note (Signed)
As above.

## 2021-07-07 NOTE — Assessment & Plan Note (Signed)
Etiology unclear, for cardiac Event monitor,  to f/u any worsening symptoms or concerns

## 2021-07-07 NOTE — Assessment & Plan Note (Signed)
Lab Results  Component Value Date   LDLCALC 62 07/05/2021   Stable, pt to continue current statin crestor 20 mg qd

## 2021-07-17 ENCOUNTER — Encounter: Payer: Self-pay | Admitting: Gastroenterology

## 2021-07-22 ENCOUNTER — Ambulatory Visit (INDEPENDENT_AMBULATORY_CARE_PROVIDER_SITE_OTHER): Payer: 59

## 2021-07-22 DIAGNOSIS — R002 Palpitations: Secondary | ICD-10-CM

## 2021-07-24 ENCOUNTER — Other Ambulatory Visit (INDEPENDENT_AMBULATORY_CARE_PROVIDER_SITE_OTHER): Payer: 59

## 2021-07-24 DIAGNOSIS — R972 Elevated prostate specific antigen [PSA]: Secondary | ICD-10-CM

## 2021-07-24 LAB — PSA: PSA: 2.27 ng/mL (ref 0.10–4.00)

## 2021-07-25 ENCOUNTER — Telehealth: Payer: Self-pay | Admitting: Internal Medicine

## 2021-07-25 NOTE — Telephone Encounter (Signed)
Pt called and stated a heart monitor was ordered for him. He said it is irritating his skin and he is unable to wear it. Pt would like to know should he continue trying to use it or should he turn it back in?  Please advise.  CB:[phone]

## 2021-07-25 NOTE — Telephone Encounter (Signed)
No sorry, he probably needs to return it, as if the skin was irritated the first time, it will just happen again .

## 2021-07-26 NOTE — Telephone Encounter (Signed)
Patient notified

## 2021-08-02 ENCOUNTER — Telehealth: Payer: Self-pay | Admitting: Internal Medicine

## 2021-08-02 MED ORDER — LISINOPRIL-HYDROCHLOROTHIAZIDE 20-12.5 MG PO TABS: 2.0000 | ORAL_TABLET | Freq: Every day | ORAL | 2 refills | Status: DC

## 2021-08-02 MED ORDER — ROSUVASTATIN CALCIUM 20 MG PO TABS: 20.0000 mg | ORAL_TABLET | Freq: Every day | ORAL | 2 refills | Status: DC

## 2021-08-02 NOTE — Telephone Encounter (Signed)
Caller & Relationship to patient: Norwin Aleman  Call back number: [phone]  Date of last office visit: 07/05/21  Date of next office visit:   Medication(s) to be refilled:  lisinopril-hydrochlorothiazide (ZESTORETIC) 20-12.5 MG tablet  rosuvastatin (CRESTOR) 20 MG tablet   Preferred Pharmacy:  [facility] [place], [place] [address] Phone:  [phone]  Fax:  [phone]

## 2021-08-22 ENCOUNTER — Ambulatory Visit: Payer: 59 | Admitting: Internal Medicine

## 2021-09-04 ENCOUNTER — Encounter: Payer: 59 | Admitting: Gastroenterology

## 2021-09-25 ENCOUNTER — Telehealth: Payer: Self-pay | Admitting: Internal Medicine

## 2021-09-25 MED ORDER — GABAPENTIN 300 MG PO CAPS
300.0000 mg | ORAL_CAPSULE | Freq: Three times a day (TID) | ORAL | 1 refills | Status: DC
Start: 2021-09-25 — End: 2022-06-02

## 2021-09-25 NOTE — Telephone Encounter (Signed)
Patient needs a refill on his gabapentin -  - please send to [facility] on [address]

## 2021-10-02 ENCOUNTER — Ambulatory Visit: Payer: 59 | Admitting: Internal Medicine

## 2022-01-01 ENCOUNTER — Ambulatory Visit: Payer: 59 | Admitting: Internal Medicine

## 2022-01-01 ENCOUNTER — Encounter: Payer: Self-pay | Admitting: Gastroenterology

## 2022-01-01 VITALS — BP 132/80 | HR 95 | Temp 97.8°F | Ht 72.0 in | Wt 289.0 lb

## 2022-01-01 DIAGNOSIS — M5416 Radiculopathy, lumbar region: Secondary | ICD-10-CM | POA: Diagnosis not present

## 2022-01-01 DIAGNOSIS — I10 Essential (primary) hypertension: Secondary | ICD-10-CM

## 2022-01-01 DIAGNOSIS — E559 Vitamin D deficiency, unspecified: Secondary | ICD-10-CM

## 2022-01-01 DIAGNOSIS — R7302 Impaired glucose tolerance (oral): Secondary | ICD-10-CM | POA: Diagnosis not present

## 2022-01-01 DIAGNOSIS — E7800 Pure hypercholesterolemia, unspecified: Secondary | ICD-10-CM

## 2022-01-01 DIAGNOSIS — I739 Peripheral vascular disease, unspecified: Secondary | ICD-10-CM

## 2022-01-01 LAB — LIPID PANEL
Cholesterol: 138 mg/dL (ref 0–200)
HDL: 61.9 mg/dL (ref >39.00)
LDL Cholesterol: 60 mg/dL (ref 0–99)
NonHDL: 75.62
Total CHOL/HDL Ratio: 2
Triglycerides: 77 mg/dL (ref 0.0–149.0)
VLDL: 15.4 mg/dL (ref 0.0–40.0)

## 2022-01-01 LAB — HEPATIC FUNCTION PANEL
ALT: 19 U/L (ref 0–53)
AST: 30 U/L (ref 0–37)
Albumin: 4.4 g/dL (ref 3.5–5.2)
Alkaline Phosphatase: 78 U/L (ref 39–117)
Bilirubin, Direct: 0.2 mg/dL (ref 0.0–0.3)
Total Bilirubin: 0.9 mg/dL (ref 0.2–1.2)
Total Protein: 7.1 g/dL (ref 6.0–8.3)

## 2022-01-01 LAB — CBC WITH DIFFERENTIAL/PLATELET
Basophils Absolute: 0 10*3/uL (ref 0.0–0.1)
Basophils Relative: 0.6 % (ref 0.0–3.0)
Eosinophils Absolute: 0.2 10*3/uL (ref 0.0–0.7)
Eosinophils Relative: 3 % (ref 0.0–5.0)
HCT: 33.1 % — ABNORMAL LOW (ref 39.0–52.0)
Hemoglobin: 11.2 g/dL — ABNORMAL LOW (ref 13.0–17.0)
Lymphocytes Relative: 24.8 % (ref 12.0–46.0)
Lymphs Abs: 1.4 10*3/uL (ref 0.7–4.0)
MCHC: 33.6 g/dL (ref 30.0–36.0)
MCV: 99.3 fl (ref 78.0–100.0)
Monocytes Absolute: 0.5 10*3/uL (ref 0.1–1.0)
Monocytes Relative: 8.6 % (ref 3.0–12.0)
Neutro Abs: 3.5 10*3/uL (ref 1.4–7.7)
Neutrophils Relative %: 63 % (ref 43.0–77.0)
Platelets: 191 10*3/uL (ref 150.0–400.0)
RBC: 3.34 Mil/uL — ABNORMAL LOW (ref 4.22–5.81)
RDW: 13.7 % (ref 11.5–15.5)
WBC: 5.6 10*3/uL (ref 4.0–10.5)

## 2022-01-01 LAB — BASIC METABOLIC PANEL
BUN: 19 mg/dL (ref 6–23)
CO2: 29 mEq/L (ref 19–32)
Calcium: 9.6 mg/dL (ref 8.4–10.5)
Chloride: 103 mEq/L (ref 96–112)
Creatinine, Ser: 1.36 mg/dL (ref 0.40–1.50)
GFR: 54.95 mL/min — ABNORMAL LOW (ref >60.00)
Glucose, Bld: 90 mg/dL (ref 70–99)
Potassium: 3.6 mEq/L (ref 3.5–5.1)
Sodium: 140 mEq/L (ref 135–145)

## 2022-01-01 LAB — VITAMIN D 25 HYDROXY (VIT D DEFICIENCY, FRACTURES): VITD: 58.88 ng/mL (ref 30.00–100.00)

## 2022-01-01 LAB — HEMOGLOBIN A1C: Hgb A1c MFr Bld: 5.1 % (ref 4.6–6.5)

## 2022-01-01 NOTE — Progress Notes (Signed)
Patient ID: Tony Savage, male   DOB: 1955/02/28, 66 y.o.   MRN: [id number]        Chief Complaint: follow up HTN, HLD and DM, PAD right leg pain, worsening lumbar radiculopathy       HPI:  Tony Savage is a 66 y.o. male here with hx of mild pAD 2019 by [facility] [place], now with worsening right lower back and leg pain, numbness and weakness with near falling for 6 wks.  Worse to walk, better to rest.  Pt denies chest pain, increased sob or doe, wheezing, orthopnea, PND, increased LE swelling, palpitations, dizziness or syncope.   Pt denies polydipsia, polyuria, or new focal neuro s/s.    Pt denies fever, wt loss, night sweats, loss of appetite, or other constitutional symptoms  Plans to have shingrix at pharmacy       Wt Readings from Last 3 Encounters:  01/01/22 289 lb (131.1 kg)  07/05/21 287 lb (130.2 kg)  02/21/21 290 lb (131.5 kg)   BP Readings from Last 3 Encounters:  01/01/22 132/80  07/05/21 124/70  02/21/21 128/72         Past Medical History:  Diagnosis Date   ABSCESS, GLUTEAL 06/14/2009   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   Anal fissure    BACK PAIN 01/16/2010   Diverticulosis    ERECTILE DYSFUNCTION 12/31/2006   FEVER UNSPECIFIED 06/08/2007   GERD 09/29/2006   GLUCOSE INTOLERANCE 12/31/2006   HEMORRHOIDS 09/29/2006   HYPERLIPIDEMIA 09/29/2006   HYPERSOMNIA 04/30/2009   HYPERTENSION 09/29/2006   Impaired fasting glucose 01/01/2007   KNEE PAIN, LEFT 10/18/2008   LOW BACK PAIN 12/31/2006   OBESITY 09/29/2006   Sleep apnea    cpap   SYNCOPE, VASOVAGAL 03/14/2008   VERTEBRAL FRACTURE 09/29/2006   Past Surgical History:  Procedure Laterality Date   COLONOSCOPY     drainage of recurrent perirectal abceses     fistula-in-ano repair     HEMORRHOID SURGERY      reports that he quit smoking about 24 years ago. His smoking use included cigarettes. He has a 37.50 pack-year smoking history. He has never used smokeless tobacco. He reports current alcohol use of about 14.0 standard drinks of  alcohol per week. He reports that he does not use drugs. family history includes Allergies in his sister; Colon cancer (age of onset: 43) in his father; Colon polyps in his brother; Diabetes in his mother; Heart disease in his father; Prostate cancer in his father. No Known Allergies Current Outpatient Medications on File Prior to Visit  Medication Sig Dispense Refill   aspirin 81 MG EC tablet Take 81 mg by mouth daily.     fenofibrate 160 MG tablet TAKE 1 TABLET BY MOUTH EVERY DAY 90 tablet 3   gabapentin (NEURONTIN) 300 MG capsule Take 1 capsule (300 mg total) by mouth 3 (three) times daily. 270 capsule 1   lisinopril-hydrochlorothiazide (ZESTORETIC) 20-12.5 MG tablet Take 2 tablets by mouth daily. 180 tablet 2   pantoprazole (PROTONIX) 40 MG tablet TAKE 1 TABLET(40 MG) BY MOUTH TWICE DAILY 180 tablet 3   prednisoLONE acetate (PRED FORTE) 1 % ophthalmic suspension Place 1 drop into the left eye 4 (four) times daily.     rosuvastatin (CRESTOR) 20 MG tablet Take 1 tablet (20 mg total) by mouth daily. 90 tablet 2   traMADol (ULTRAM) 50 MG tablet Take 1 tablet (50 mg total) by mouth every 6 (six) hours as needed. 60 tablet 2   No current  facility-administered medications on file prior to visit.        ROS:  All others reviewed and negative.  Objective        PE:  BP 132/80 (BP Location: Left Arm, Patient Position: Sitting, Cuff Size: Large)   Pulse 95   Temp 97.8 F (36.6 C) (Oral)   Ht 6' (1.829 m)   Wt 289 lb (131.1 kg)   SpO2 97%   BMI 39.20 kg/m                 Constitutional: Pt appears in NAD               HENT: Head: NCAT.                Right Ear: External ear normal.                 Left Ear: External ear normal.                Eyes: . Pupils are equal, round, and reactive to light. Conjunctivae and EOM are normal               Nose: without d/c or deformity               Neck: Neck supple. Gross normal ROM               Cardiovascular: Normal rate and regular rhythm.                  Pulmonary/Chest: Effort normal and breath sounds without rales or wheezing.                Abd:  Soft, NT, ND, + BS, no organomegaly               Neurological: Pt is alert. At baseline orientation, motor with 4/5 motor weakness on right               Skin: Skin is warm. No rashes, no other new lesions, LE edema - trace bilateral, dorsalis pedis trace on right, !+ on left               Psychiatric: Pt behavior is normal without agitation   Micro: none  Cardiac tracings I have personally interpreted today:  none  Pertinent Radiological findings (summarize): none   Lab Results  Component Value Date   WBC 5.6 01/01/2022   HGB 11.2 (L) 01/01/2022   HCT 33.1 (L) 01/01/2022   PLT 191.0 01/01/2022   GLUCOSE 90 01/01/2022   CHOL 138 01/01/2022   TRIG 77.0 01/01/2022   HDL 61.90 01/01/2022   LDLDIRECT 134.8 04/29/2011   LDLCALC 60 01/01/2022   ALT 19 01/01/2022   AST 30 01/01/2022   NA 140 01/01/2022   K 3.6 01/01/2022   CL 103 01/01/2022   CREATININE 1.36 01/01/2022   BUN 19 01/01/2022   CO2 29 01/01/2022   TSH 2.18 07/05/2021   PSA 2.27 07/24/2021   HGBA1C 5.1 01/01/2022   MICROALBUR 0.8 07/05/2021   Assessment/Plan:  Tony Savage is a 66 y.o. Black or African American [2] male with  has a past medical history of ABSCESS, GLUTEAL (06/14/2009), Acute gouty arthropathy (10/18/2008), Anal fissure, BACK PAIN (01/16/2010), Diverticulosis, ERECTILE DYSFUNCTION (12/31/2006), FEVER UNSPECIFIED (06/08/2007), GERD (09/29/2006), GLUCOSE INTOLERANCE (12/31/2006), HEMORRHOIDS (09/29/2006), HYPERLIPIDEMIA (09/29/2006), HYPERSOMNIA (04/30/2009), HYPERTENSION (09/29/2006), Impaired fasting glucose (01/01/2007), KNEE PAIN, LEFT (10/18/2008), LOW BACK PAIN (12/31/2006), OBESITY (09/29/2006), Sleep apnea, SYNCOPE, VASOVAGAL (03/14/2008), and  VERTEBRAL FRACTURE (09/29/2006).  PAD (peripheral artery disease) ([place]) Can't r/o worsening with RLE, for f/u arterial study  Essential hypertension BP Readings from  Last 3 Encounters:  01/01/22 132/80  07/05/21 124/70  02/21/21 128/72   Stable, pt to continue medical treatment zestoretic 20 12.5 qd   Hyperlipidemia Lab Results  Component Value Date   LDLCALC 60 01/01/2022   Stable, pt to continue current statin crestor 20 mg, fenofibrate 160 qd, also for cardiac CT score  Impaired glucose tolerance Lab Results  Component Value Date   HGBA1C 5.1 01/01/2022   Stable, pt to continue current medical treatment - diet, wt control, excercsie   Lumbar radiculopathy With worsening back and leg pain and weakness neuro change, unable to walk the dog more than 300 ft, for MRI LS spine  Followup: Return in about 1 month (around 02/01/2022).  Cathlean Cower, MD 01/04/2022 2:46 PM [facility]

## 2022-01-01 NOTE — Patient Instructions (Signed)
Please remember to call again about your colonoscopy, as the last testing was done May 2018 by our records  Please have your Shingrix (shingles) shots done at your local pharmacy.  Please continue all other medications as before, and refills have been done if requested.  Please have the pharmacy call with any other refills you may need.  Please continue your efforts at being more active, low cholesterol diet, and weight control.  Please keep your appointments with your specialists as you may have planned  You will be contacted regarding the referral for: circulation testing for the legs, the MRI for your lower back, and the Cardiac CT score testing  Please go to the LAB at the blood drawing area for the tests to be done  You will be contacted by phone if any changes need to be made immediately.  Otherwise, you will receive a letter about your results with an explanation, but please check with MyChart first.  Please remember to sign up for MyChart if you have not done so, as this will be important to you in the future with finding out test results, communicating by private email, and scheduling acute appointments online when needed.  Please make an Appointment to return in 4 months, or sooner if needed

## 2022-01-04 NOTE — Assessment & Plan Note (Signed)
BP Readings from Last 3 Encounters:  01/01/22 132/80  07/05/21 124/70  02/21/21 128/72   Stable, pt to continue medical treatment zestoretic 20 12.5 qd

## 2022-01-04 NOTE — Assessment & Plan Note (Signed)
Can't r/o worsening with RLE, for f/u arterial study

## 2022-01-04 NOTE — Assessment & Plan Note (Addendum)
Lab Results  Component Value Date   LDLCALC 60 01/01/2022   Stable, pt to continue current statin crestor 20 mg, fenofibrate 160 qd, also for cardiac CT score

## 2022-01-04 NOTE — Assessment & Plan Note (Signed)
Lab Results  Component Value Date   HGBA1C 5.1 01/01/2022   Stable, pt to continue current medical treatment - diet, wt control, excercsie

## 2022-01-04 NOTE — Assessment & Plan Note (Addendum)
With worsening back and leg pain and weakness neuro change, unable to walk the dog more than 300 ft, for MRI LS spine

## 2022-01-09 ENCOUNTER — Inpatient Hospital Stay (HOSPITAL_COMMUNITY): Admission: RE | Admit: 2022-01-09 | Payer: 59 | Source: Ambulatory Visit

## 2022-01-16 ENCOUNTER — Ambulatory Visit (AMBULATORY_SURGERY_CENTER): Payer: 59

## 2022-01-16 VITALS — Ht 72.0 in | Wt 289.0 lb

## 2022-01-16 DIAGNOSIS — Z86010 Personal history of colonic polyps: Secondary | ICD-10-CM

## 2022-01-16 MED ORDER — NA SULFATE-K SULFATE-MG SULF 17.5-3.13-1.6 GM/177ML PO SOLN: 1.0000 | Freq: Once | ORAL | 0 refills | Status: AC

## 2022-01-16 NOTE — Progress Notes (Signed)
Pre visit completed via phone call; Patient verified name, DOB, and address;  No egg or soy allergy known to patient;  No issues known to pt with past sedation with any surgeries or procedures; Patient denies ever being told they had issues or difficulty with intubation;  No FH of Malignant Hyperthermia; Pt is not on diet pills; Pt is not on home 02; Pt is not on blood thinners;  Pt denies issues with constipation  No A fib or A flutter; Have any cardiac testing pending--NO Pt instructed to use [email or web address] or GoodRx for a price reduction on prep   Insurance verified during [place] appt=[employer]  Patient's chart reviewed by Osvaldo Angst CNRA prior to previsit and patient appropriate for the [place].  Previsit completed and red dot placed by patient's name on their procedure day (on provider's schedule).    GoodRx sent with instructions via mail to patient;

## 2022-01-22 ENCOUNTER — Ambulatory Visit (HOSPITAL_COMMUNITY): Payer: 59

## 2022-01-28 ENCOUNTER — Telehealth: Payer: Self-pay | Admitting: Gastroenterology

## 2022-01-28 NOTE — Telephone Encounter (Signed)
PT needs to have prep instructions resent to mychart. Please advise.

## 2022-01-28 NOTE — Telephone Encounter (Signed)
PT is calling about coupon for prep medication. Please advise.

## 2022-01-29 NOTE — Telephone Encounter (Signed)
Resent to pt via MyChart and pt made aware

## 2022-01-29 NOTE — Telephone Encounter (Signed)
Spoke with patient about Suprep coupon for [employer]. He will come pick it up from front desk

## 2022-02-07 ENCOUNTER — Encounter: Payer: Self-pay | Admitting: Gastroenterology

## 2022-02-10 ENCOUNTER — Ambulatory Visit (HOSPITAL_COMMUNITY): Payer: 59

## 2022-02-11 ENCOUNTER — Ambulatory Visit (AMBULATORY_SURGERY_CENTER): Payer: 59 | Admitting: Gastroenterology

## 2022-02-11 ENCOUNTER — Encounter: Payer: Self-pay | Admitting: Gastroenterology

## 2022-02-11 VITALS — BP 136/66 | HR 62 | Temp 99.5°F | Resp 13 | Ht 72.0 in | Wt 289.0 lb

## 2022-02-11 DIAGNOSIS — D124 Benign neoplasm of descending colon: Secondary | ICD-10-CM | POA: Diagnosis not present

## 2022-02-11 DIAGNOSIS — Z86010 Personal history of colonic polyps: Secondary | ICD-10-CM | POA: Diagnosis not present

## 2022-02-11 DIAGNOSIS — K633 Ulcer of intestine: Secondary | ICD-10-CM

## 2022-02-11 DIAGNOSIS — D122 Benign neoplasm of ascending colon: Secondary | ICD-10-CM

## 2022-02-11 DIAGNOSIS — K529 Noninfective gastroenteritis and colitis, unspecified: Secondary | ICD-10-CM | POA: Diagnosis not present

## 2022-02-11 DIAGNOSIS — Z09 Encounter for follow-up examination after completed treatment for conditions other than malignant neoplasm: Secondary | ICD-10-CM

## 2022-02-11 MED ORDER — SODIUM CHLORIDE 0.9 % IV SOLN: 500.0000 mL | Freq: Once | INTRAVENOUS | Status: DC

## 2022-02-11 NOTE — Progress Notes (Signed)
Pt's states no medical or surgical changes since previsit or office visit. 

## 2022-02-11 NOTE — Progress Notes (Signed)
History & Physical  Primary Care Physician:  Tony Borg, MD Primary Gastroenterologist: Tony Edward, MD  CHIEF COMPLAINT:  Personal history of colon polyps   HPI: Tony Savage is a 67 y.o. male with a ersonal history of adenomatous colon polyps for colonoscopy.  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   Past Medical History:  Diagnosis Date   ABSCESS, GLUTEAL 06/14/2009   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   in bilateral hips R>L   Anal fissure    BACK PAIN 01/16/2010   Cataract 12/2020   bilaterl sx   Diverticulosis    ERECTILE DYSFUNCTION 12/31/2006   FEVER UNSPECIFIED 06/08/2007   GERD 09/29/2006   on meds   GLUCOSE INTOLERANCE 12/31/2006   HEMORRHOIDS 09/29/2006   HYPERLIPIDEMIA 09/29/2006   on meds   HYPERSOMNIA 04/30/2009   HYPERTENSION 09/29/2006   on meds   Impaired fasting glucose 01/01/2007   KNEE PAIN, LEFT 10/18/2008   LOW BACK PAIN 12/31/2006   OBESITY 09/29/2006   Peripheral neuropathy    on Gabapentin   Sleep apnea    uses CPAP   SYNCOPE, VASOVAGAL 03/14/2008   VERTEBRAL FRACTURE 09/29/2006    Past Surgical History:  Procedure Laterality Date   COLONOSCOPY  2018   MS-MAC-suprep(good)-no polyps   drainage of recurrent perirectal abceses     fistula-in-ano repair  2010   HEMORRHOID SURGERY      Prior to Admission medications   Medication Sig Start Date End Date Taking? Authorizing Provider  aspirin 81 MG EC tablet Take 81 mg by mouth daily.   Yes [provider]  fenofibrate 160 MG tablet TAKE 1 TABLET BY MOUTH EVERY DAY 03/26/21  Yes Tony Borg, MD  gabapentin (NEURONTIN) 300 MG capsule Take 1 capsule (300 mg total) by mouth 3 (three) times daily. 09/25/21  Yes Tony Borg, MD  lisinopril-hydrochlorothiazide (ZESTORETIC) 20-12.5 MG tablet Take 2 tablets by mouth daily. 08/02/21  Yes Tony Borg, MD  pantoprazole (PROTONIX) 40 MG tablet TAKE 1 TABLET(40 MG) BY MOUTH TWICE DAILY 05/17/21  Yes  Tony Borg, MD  rosuvastatin (CRESTOR) 20 MG tablet Take 1 tablet (20 mg total) by mouth daily. 08/02/21  Yes Tony Borg, MD  VITAMIN D PO Take 1 tablet by mouth daily at 6 (six) AM.   Yes [provider]    Current Outpatient Medications  Medication Sig Dispense Refill   aspirin 81 MG EC tablet Take 81 mg by mouth daily.     fenofibrate 160 MG tablet TAKE 1 TABLET BY MOUTH EVERY DAY 90 tablet 3   gabapentin (NEURONTIN) 300 MG capsule Take 1 capsule (300 mg total) by mouth 3 (three) times daily. 270 capsule 1   lisinopril-hydrochlorothiazide (ZESTORETIC) 20-12.5 MG tablet Take 2 tablets by mouth daily. 180 tablet 2   pantoprazole (PROTONIX) 40 MG tablet TAKE 1 TABLET(40 MG) BY MOUTH TWICE DAILY 180 tablet 3   rosuvastatin (CRESTOR) 20 MG tablet Take 1 tablet (20 mg total) by mouth daily. 90 tablet 2   VITAMIN D PO Take 1 tablet by mouth daily at 6 (six) AM.     Current Facility-Administered Medications  Medication Dose Route Frequency Provider Last Rate Last Admin   0.9 %  sodium chloride infusion  500 mL Intravenous Once Tony Artist, MD        Allergies as of 02/11/2022   (No Known Allergies)    Family History  Problem Relation Age of Onset  Diabetes Mother    Colon polyps Father 42   Heart disease Father        CHF   Prostate cancer Father        went from prostate to colon   Colon cancer Father 67   Colon polyps Sister 5   Allergies Sister    Colon polyps Brother 61   Esophageal cancer Neg Hx    Stomach cancer Neg Hx    Rectal cancer Neg Hx     Social History   Socioeconomic History   Marital status: Married    Spouse name: Not on file   Number of children: 1   Years of education: Not on file   Highest education level: Not on file  Occupational History   Occupation: Sales executive: UNEMPLOYED  Tobacco Use   Smoking status: Former    Packs/day: 1.50    Years: 25.00    Total pack years: 37.50    Types: Cigarettes     Quit date: 03/19/1997    Years since quitting: 24.9   Smokeless tobacco: Never   Tobacco comments:    1 and 1/2 ppd for 15 years, Quit 1999  Vaping Use   Vaping Use: Never used  Substance and Sexual Activity   Alcohol use: Not Currently    Alcohol/week: 0.0 - 20.0 standard drinks of alcohol   Drug use: No   Sexual activity: Not on file  Other Topics Concern   Not on file  Social History Narrative   Not on file   Social Determinants of Health   Financial Resource Strain: Not on file  Food Insecurity: Not on file  Transportation Needs: Not on file  Physical Activity: Not on file  Stress: Not on file  Social Connections: Not on file  Intimate Partner Violence: Not on file    Review of Systems:  All systems reviewed were negative except where noted in HPI.   Physical Exam: General:  Alert, well-developed, in NAD Head:  Normocephalic and atraumatic. Eyes:  Sclera clear, no icterus.   Conjunctiva pink. Ears:  Normal auditory acuity. Mouth:  No deformity or lesions.  Neck:  Supple; no masses . Lungs:  Clear throughout to auscultation.   No wheezes, crackles, or rhonchi. No acute distress. Heart:  Regular rate and rhythm; no murmurs. Abdomen:  Soft, nondistended, nontender. No masses, hepatomegaly. No obvious masses.  Normal bowel .    Rectal:  Deferred   Msk:  Symmetrical without gross deformities.. Pulses:  Normal pulses noted. Extremities:  Without edema. Neurologic:  Alert and  oriented x4;  grossly normal neurologically. Skin:  Intact without significant lesions or rashes. Psych:  Alert and cooperative. Normal mood and affect.  Impression / Savage:   Personal history of adenomatous colon polyps for colonoscopy.  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  Tony Savage. Tony Savage  02/11/2022, 8:59 AM See Tony Savage, [facility], to contact our on call provider

## 2022-02-11 NOTE — Progress Notes (Signed)
Sedate, gd SR, tolerated procedure well, VSS, report to RN 

## 2022-02-11 NOTE — Op Note (Signed)
[name] Procedure Date: 02/11/2022 8:59 AM MRN: [id number] Endoscopist: Ladene Artist , MD, [id number] Age: 67 Referring MD:  Date of Birth: 06-26-1955 Gender: Male Account #: [id number] Procedure:                Colonoscopy Indications:              Surveillance: Personal history of adenomatous                            polyps on last colonoscopy 5 years ago Medicines:                Monitored Anesthesia Care Procedure:                Pre-Anesthesia Assessment:                           - Prior to the procedure, a History and Physical                            was performed, and patient medications and                            allergies were reviewed. The patient's tolerance of                            previous anesthesia was also reviewed. The risks                            and benefits of the procedure and the sedation                            options and risks were discussed with the patient.                            All questions were answered, and informed consent                            was obtained. Prior Anticoagulants: The patient has                            taken no anticoagulant or antiplatelet agents. ASA                            Grade Assessment: III - A patient with severe                            systemic disease. After reviewing the risks and                            benefits, the patient was deemed in satisfactory                            condition to undergo the procedure.  After obtaining informed consent, the colonoscope                            was passed under direct vision. Throughout the                            procedure, the patient's blood pressure, pulse, and                            oxygen saturations were monitored continuously. The                            0441 PCF-H190TL Slim SB Colonoscope was introduced                            through the anus  and advanced to the the cecum,                            identified by appendiceal orifice and ileocecal                            valve. The ileocecal valve, appendiceal orifice,                            and rectum were photographed. The quality of the                            bowel preparation was good. The colonoscopy was                            performed without difficulty. The patient tolerated                            the procedure well. Scope In: 9:07:00 AM Scope Out: 9:24:55 AM Scope Withdrawal Time: 0 hours 11 minutes 41 seconds  Total Procedure Duration: 0 hours 17 minutes 55 seconds  Findings:                 The perianal and digital rectal examinations were                            normal.                           Multiple small-mouthed diverticula were found in                            the right colon. There was no evidence of                            diverticular bleeding.                           Multiple small-mouthed diverticula were found in  the left colon. There was narrowing of the colon in                            association with the diverticular opening. There                            was no evidence of diverticular bleeding.                           A benign-appearing, intrinsic moderate stenosis                            measuring 3 cm (in length) x 9 mm (inner diameter)                            was found in the sigmoid colon associated with                            diverticular disease and was traversed.                           A few localized non-bleeding erosions were found in                            the rectum. No stigmata of recent bleeding were                            seen. Biopsies were taken with a cold forceps for                            histology.                           Internal hemorrhoids were found during                            retroflexion. The hemorrhoids were moderate and                             Grade I (internal hemorrhoids that do not prolapse).                           The exam was otherwise without abnormality on                            direct and retroflexion views. Complications:            No immediate complications. Estimated blood loss:                            None. Estimated Blood Loss:     Estimated blood loss: none. Impression:               - Mild diverticulosis in the right colon. There was  no evidence of diverticular bleeding.                           - Severe diverticulosis in the left colon. There                            was narrowing of the colon in association with the                            diverticular opening. There was no evidence of                            diverticular bleeding.                           - Stricture in the sigmoid colon.                           - A few erosions in the rectum. Biopsied.                           - Internal hemorrhoids.                           - The examination was otherwise normal on direct                            and retroflexion views. Recommendation:           - Repeat colonoscopy in 10 years for surveillance                            based on pathology results.                           - Patient has a contact number available for                            emergencies. The signs and symptoms of potential                            delayed complications were discussed with the                            patient. Return to normal activities tomorrow.                            Written discharge instructions were provided to the                            patient.                           - High fiber diet.                           -  Continue present medications.                           - Await pathology results. Ladene Artist, MD 02/11/2022 9:30:42 AM This report has been signed electronically.

## 2022-02-11 NOTE — Patient Instructions (Addendum)
Information on hemorrhoids and diverticulosis given to you today.  Await pathology results.  Resume previous diet and medications.    YOU HAD AN ENDOSCOPIC PROCEDURE TODAY AT [facility]:   Refer to the procedure report that was given to you for any specific questions about what was found during the examination.  If the procedure report does not answer your questions, please call your gastroenterologist to clarify.  If you requested that your care partner not be given the details of your procedure findings, then the procedure report has been included in a sealed envelope for you to review at your convenience later.  YOU SHOULD EXPECT: Some feelings of bloating in the abdomen. Passage of more gas than usual.  Walking can help get rid of the air that was put into your GI tract during the procedure and reduce the bloating. If you had a lower endoscopy (such as a colonoscopy or flexible sigmoidoscopy) you may notice spotting of blood in your stool or on the toilet paper. If you underwent a bowel prep for your procedure, you may not have a normal bowel movement for a few days.  Please Note:  You might notice some irritation and congestion in your nose or some drainage.  This is from the oxygen used during your procedure.  There is no need for concern and it should clear up in a day or so.  SYMPTOMS TO REPORT IMMEDIATELY:  Following lower endoscopy (colonoscopy or flexible sigmoidoscopy):  Excessive amounts of blood in the stool  Significant tenderness or worsening of abdominal pains  Swelling of the abdomen that is new, acute  Fever of 100F or higher   For urgent or emergent issues, a gastroenterologist can be reached at any hour by calling [phone]. Do not use MyChart messaging for urgent concerns.    DIET:  We do recommend a small meal at first, but then you may proceed to your regular diet.  Drink plenty of fluids but you should avoid alcoholic beverages for 24  hours.  ACTIVITY:  You should plan to take it easy for the rest of today and you should NOT DRIVE or use heavy machinery until tomorrow (because of the sedation medicines used during the test).    FOLLOW UP: Our staff will call the number listed on your records the next business day following your procedure.  We will call around 7:15- 8:00 am to check on you and address any questions or concerns that you may have regarding the information given to you following your procedure. If we do not reach you, we will leave a message.     If any biopsies were taken you will be contacted by phone or by letter within the next 1-3 weeks.  Please call us at [phone] if you have not heard about the biopsies in 3 weeks.    SIGNATURES/CONFIDENTIALITY: You and/or your care partner have signed paperwork which will be entered into your electronic medical record.  These signatures attest to the fact that that the information above on your After Visit Summary has been reviewed and is understood.  Full responsibility of the confidentiality of this discharge information lies with you and/or your care-partner.

## 2022-02-11 NOTE — Progress Notes (Signed)
Called to room to assist during endoscopic procedure.  Patient ID and intended procedure confirmed with present staff. Received instructions for my participation in the procedure from the performing physician.

## 2022-02-12 ENCOUNTER — Telehealth: Payer: Self-pay

## 2022-02-12 NOTE — Telephone Encounter (Signed)
  Follow up Call-     02/11/2022    8:31 AM  Call back number  Post procedure Call Back phone  # [phone]  Permission to leave phone message Yes     Patient questions:  Do you have a fever, pain , or abdominal swelling? No. Pain Score  0 *  Have you tolerated food without any problems? Yes.    Have you been able to return to your normal activities? Yes.    Do you have any questions about your discharge instructions: Diet   No. Medications  No. Follow up visit  No.  Do you have questions or concerns about your Care? No.  Actions: * If pain score is 4 or above: No action needed, pain <4.

## 2022-02-25 ENCOUNTER — Telehealth: Payer: Self-pay | Admitting: Internal Medicine

## 2022-02-25 ENCOUNTER — Encounter: Payer: Self-pay | Admitting: Internal Medicine

## 2022-02-25 NOTE — Telephone Encounter (Signed)
Patient called wanting to know if Dr. Jenny Reichmann can write a letter for him to not go to jury duty because its a far walk for him and he is unable to walk the distance. Madaline Savage duty is on March 4th. Best call back number is [phone].

## 2022-02-25 NOTE — Telephone Encounter (Signed)
Please Print and Send recent Letter from today

## 2022-02-25 NOTE — Telephone Encounter (Signed)
Patient is requesting a letter to be exempt from jury duty because its a far walk for him and he is unable to walk the distance

## 2022-02-25 NOTE — Telephone Encounter (Signed)
Letter sent, patient informed

## 2022-02-26 ENCOUNTER — Encounter: Payer: Self-pay | Admitting: Gastroenterology

## 2022-02-28 ENCOUNTER — Telehealth: Payer: Self-pay | Admitting: Internal Medicine

## 2022-02-28 NOTE — Telephone Encounter (Signed)
Pt called wanted an excuse letter for jury duty advised pt the nurse will let him know when it is ready.

## 2022-02-28 NOTE — Telephone Encounter (Signed)
Informed patient that letter was written and mailed out on 1/30

## 2022-04-09 ENCOUNTER — Other Ambulatory Visit: Payer: Self-pay | Admitting: Internal Medicine

## 2022-04-22 ENCOUNTER — Other Ambulatory Visit: Payer: Self-pay

## 2022-04-22 MED ORDER — ROSUVASTATIN CALCIUM 20 MG PO TABS: 20.0000 mg | ORAL_TABLET | Freq: Every day | ORAL | 2 refills | Status: DC

## 2022-05-16 ENCOUNTER — Other Ambulatory Visit: Payer: Self-pay

## 2022-05-16 MED ORDER — LISINOPRIL-HYDROCHLOROTHIAZIDE 20-12.5 MG PO TABS: 2.0000 | ORAL_TABLET | Freq: Every day | ORAL | 2 refills | Status: DC

## 2022-06-02 ENCOUNTER — Telehealth: Payer: Self-pay | Admitting: Internal Medicine

## 2022-06-02 MED ORDER — GABAPENTIN 300 MG PO CAPS: 300.0000 mg | ORAL_CAPSULE | Freq: Three times a day (TID) | ORAL | 0 refills | Status: DC

## 2022-06-02 NOTE — Telephone Encounter (Signed)
Prescription Request  06/02/2022  LOV: 01/01/2022  What is the name of the medication or equipment?  gabapentin (NEURONTIN) 300 MG capsule  Have you contacted your pharmacy to request a refill? Yes   Which pharmacy would you like this sent to?  [id number] - [place], [place] - [address] AT [address] [place] [address] Phone: [phone] Fax: [phone]    Patient notified that their request is being sent to the clinical staff for review and that they should receive a response within 2 business days.   Please advise at Mobile [phone] (mobile)

## 2022-06-02 NOTE — Telephone Encounter (Signed)
Rx sent to [facility]../[email or web address]

## 2022-06-30 ENCOUNTER — Other Ambulatory Visit: Payer: Self-pay | Admitting: Internal Medicine

## 2022-07-14 DIAGNOSIS — G4733 Obstructive sleep apnea (adult) (pediatric): Secondary | ICD-10-CM | POA: Diagnosis not present

## 2022-07-15 ENCOUNTER — Encounter: Payer: Self-pay | Admitting: Internal Medicine

## 2022-07-15 ENCOUNTER — Ambulatory Visit (INDEPENDENT_AMBULATORY_CARE_PROVIDER_SITE_OTHER): Payer: Medicare PPO | Admitting: Internal Medicine

## 2022-07-15 VITALS — BP 126/78 | HR 59 | Temp 98.2°F | Ht 72.0 in | Wt 283.0 lb

## 2022-07-15 DIAGNOSIS — I10 Essential (primary) hypertension: Secondary | ICD-10-CM

## 2022-07-15 DIAGNOSIS — N1831 Chronic kidney disease, stage 3a: Secondary | ICD-10-CM | POA: Diagnosis not present

## 2022-07-15 DIAGNOSIS — R7302 Impaired glucose tolerance (oral): Secondary | ICD-10-CM

## 2022-07-15 DIAGNOSIS — Z0000 Encounter for general adult medical examination without abnormal findings: Secondary | ICD-10-CM

## 2022-07-15 DIAGNOSIS — M5416 Radiculopathy, lumbar region: Secondary | ICD-10-CM | POA: Diagnosis not present

## 2022-07-15 DIAGNOSIS — E538 Deficiency of other specified B group vitamins: Secondary | ICD-10-CM

## 2022-07-15 DIAGNOSIS — E7800 Pure hypercholesterolemia, unspecified: Secondary | ICD-10-CM

## 2022-07-15 DIAGNOSIS — Z125 Encounter for screening for malignant neoplasm of prostate: Secondary | ICD-10-CM | POA: Diagnosis not present

## 2022-07-15 DIAGNOSIS — E559 Vitamin D deficiency, unspecified: Secondary | ICD-10-CM | POA: Diagnosis not present

## 2022-07-15 DIAGNOSIS — Z0001 Encounter for general adult medical examination with abnormal findings: Secondary | ICD-10-CM

## 2022-07-15 LAB — BASIC METABOLIC PANEL
BUN: 29 mg/dL — ABNORMAL HIGH (ref 6–23)
CO2: 28 mEq/L (ref 19–32)
Calcium: 9.9 mg/dL (ref 8.4–10.5)
Chloride: 103 mEq/L (ref 96–112)
Creatinine, Ser: 1.65 mg/dL — ABNORMAL HIGH (ref 0.40–1.50)
GFR: 43.41 mL/min — ABNORMAL LOW (ref >60.00)
Glucose, Bld: 98 mg/dL (ref 70–99)
Potassium: 4.4 mEq/L (ref 3.5–5.1)
Sodium: 139 mEq/L (ref 135–145)

## 2022-07-15 LAB — LIPID PANEL
Cholesterol: 131 mg/dL (ref 0–200)
HDL: 46.6 mg/dL (ref >39.00)
LDL Cholesterol: 66 mg/dL (ref 0–99)
NonHDL: 84.18
Total CHOL/HDL Ratio: 3
Triglycerides: 89 mg/dL (ref 0.0–149.0)
VLDL: 17.8 mg/dL (ref 0.0–40.0)

## 2022-07-15 LAB — MICROALBUMIN / CREATININE URINE RATIO
Creatinine,U: 98.8 mg/dL
Microalb Creat Ratio: 0.7 mg/g (ref 0.0–30.0)
Microalb, Ur: <0.7 mg/dL (ref 0.0–1.9)

## 2022-07-15 LAB — URINALYSIS, ROUTINE W REFLEX MICROSCOPIC
Bilirubin Urine: NEGATIVE
Hgb urine dipstick: NEGATIVE
Ketones, ur: NEGATIVE
Leukocytes,Ua: NEGATIVE
Nitrite: NEGATIVE
RBC / HPF: NONE SEEN (ref 0–?)
Specific Gravity, Urine: 1.015 (ref 1.000–1.030)
Total Protein, Urine: NEGATIVE
Urine Glucose: NEGATIVE
Urobilinogen, UA: 1 (ref 0.0–1.0)
pH: 7.5 (ref 5.0–8.0)

## 2022-07-15 LAB — HEPATIC FUNCTION PANEL
ALT: 18 U/L (ref 0–53)
AST: 28 U/L (ref 0–37)
Albumin: 4.3 g/dL (ref 3.5–5.2)
Alkaline Phosphatase: 77 U/L (ref 39–117)
Bilirubin, Direct: 0.3 mg/dL (ref 0.0–0.3)
Total Bilirubin: 1 mg/dL (ref 0.2–1.2)
Total Protein: 7.2 g/dL (ref 6.0–8.3)

## 2022-07-15 LAB — CBC WITH DIFFERENTIAL/PLATELET
Basophils Absolute: 0 10*3/uL (ref 0.0–0.1)
Basophils Relative: 0.7 % (ref 0.0–3.0)
Eosinophils Absolute: 0.1 10*3/uL (ref 0.0–0.7)
Eosinophils Relative: 2.5 % (ref 0.0–5.0)
HCT: 32.4 % — ABNORMAL LOW (ref 39.0–52.0)
Hemoglobin: 10.7 g/dL — ABNORMAL LOW (ref 13.0–17.0)
Lymphocytes Relative: 22.9 % (ref 12.0–46.0)
Lymphs Abs: 1.3 10*3/uL (ref 0.7–4.0)
MCHC: 32.9 g/dL (ref 30.0–36.0)
MCV: 99.9 fl (ref 78.0–100.0)
Monocytes Absolute: 0.4 10*3/uL (ref 0.1–1.0)
Monocytes Relative: 7.5 % (ref 3.0–12.0)
Neutro Abs: 3.9 10*3/uL (ref 1.4–7.7)
Neutrophils Relative %: 66.4 % (ref 43.0–77.0)
Platelets: 211 10*3/uL (ref 150.0–400.0)
RBC: 3.24 Mil/uL — ABNORMAL LOW (ref 4.22–5.81)
RDW: 13.3 % (ref 11.5–15.5)
WBC: 5.8 10*3/uL (ref 4.0–10.5)

## 2022-07-15 LAB — TSH: TSH: 5.02 u[IU]/mL (ref 0.35–5.50)

## 2022-07-15 LAB — HEMOGLOBIN A1C: Hgb A1c MFr Bld: 5 % (ref 4.6–6.5)

## 2022-07-15 LAB — VITAMIN B12: Vitamin B-12: 251 pg/mL (ref 211–911)

## 2022-07-15 LAB — PSA: PSA: 3.15 ng/mL (ref 0.10–4.00)

## 2022-07-15 LAB — VITAMIN D 25 HYDROXY (VIT D DEFICIENCY, FRACTURES): VITD: 59.49 ng/mL (ref 30.00–100.00)

## 2022-07-15 MED ORDER — PANTOPRAZOLE SODIUM 40 MG PO TBEC: DELAYED_RELEASE_TABLET | ORAL | 3 refills | Status: DC

## 2022-07-15 MED ORDER — LISINOPRIL-HYDROCHLOROTHIAZIDE 20-12.5 MG PO TABS: 2.0000 | ORAL_TABLET | Freq: Every day | ORAL | 3 refills | Status: DC

## 2022-07-15 MED ORDER — FENOFIBRATE 160 MG PO TABS: 160.0000 mg | ORAL_TABLET | Freq: Every day | ORAL | 3 refills | Status: DC

## 2022-07-15 MED ORDER — GABAPENTIN 300 MG PO CAPS: 300.0000 mg | ORAL_CAPSULE | Freq: Three times a day (TID) | ORAL | 1 refills | Status: DC

## 2022-07-15 MED ORDER — ROSUVASTATIN CALCIUM 20 MG PO TABS: 20.0000 mg | ORAL_TABLET | Freq: Every day | ORAL | 3 refills | Status: DC

## 2022-07-15 NOTE — Patient Instructions (Addendum)
Please have your Prevnar 20 pneumonia shot done at your local pharmacy.  Please continue all other medications as before, and refills have been done if requested.  Please have the pharmacy call with any other refills you may need.  Please continue your efforts at being more active, low cholesterol diet, and weight control.  You are otherwise up to date with prevention measures today.  Please keep your appointments with your specialists as you may have planned  Please go to the LAB at the blood drawing area for the tests to be done  You will be contacted by phone if any changes need to be made immediately.  Otherwise, you will receive a letter about your results with an explanation, but please check with MyChart first.  Please remember to sign up for MyChart if you have not done so, as this will be important to you in the future with finding out test results, communicating by private email, and scheduling acute appointments online when needed.  Please make an Appointment to return in 6 months, or sooner if needed, also with Lab Appointment for testing done 3-5 days before at the FIRST FLOOR Lab (so this is for TWO appointments - please see the scheduling desk as you leave)

## 2022-07-15 NOTE — Progress Notes (Signed)
The test results show that your current treatment is OK, as the tests are stable.  Please continue the same plan.  There is no other need for change of treatment or further evaluation based on these results, at this time.  thanks 

## 2022-07-15 NOTE — Progress Notes (Signed)
Patient ID: Tony Savage, male   DOB: [birth year], 67 y.o.   MRN: [id number]         Chief Complaint:: wellness exam and right lumbar radiculopathy, htn, hyperglycemia, ckd 3a,        HPI:  Tony Savage is a 67 y.o. male here for wellness exam; for prevnar 20 at the pharmacy, o/w up to date                Also has worsening right lower back pain with right leg pain and weakness for > 1 mo with tendency to fall to the right with walking in the hallways, has to hold on to the walls.  Pain worse to walk.  No falls.  Pt denies chest pain, increased sob or doe, wheezing, orthopnea, PND, increased LE swelling, palpitations, dizziness or syncope.   Pt denies polydipsia, polyuria, or new focal neuro s/s.    Pt denies fever, wt loss, night sweats, loss of appetite, or other constitutional symptoms  Wt down 6 lbs.   Wt Readings from Last 3 Encounters:  07/15/22 283 lb (128.4 kg)  02/11/22 289 lb (131.1 kg)  01/16/22 289 lb (131.1 kg)   BP Readings from Last 3 Encounters:  07/15/22 126/78  02/11/22 136/66  01/01/22 132/80   Immunization History  Administered Date(s) Administered   Influenza,inj,Quad PF,6+ Mos 11/12/2019, 10/08/2021   Influenza,inj,Quad PF,6-35 Mos 10/28/2018   Influenza-Unspecified 12/03/2015, 01/07/2021   PFIZER(Purple Top)SARS-COV-2 Vaccination 04/03/2019, 05/03/2019, 12/31/2019   Td 03/14/2008   Tdap 07/22/2019   Zoster Recombinat (Shingrix) 03/03/2022, 05/05/2022   Health Maintenance Due  Topic Date Due   Medicare Annual Wellness (AWV)  Never done   Pneumonia Vaccine 53+ Years old (1 of 1 - PCV) Never done      Past Medical History:  Diagnosis Date   ABSCESS, GLUTEAL 06/14/2009   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   in bilateral hips R>L   Anal fissure    BACK PAIN 01/16/2010   Cataract 12/2020   bilaterl sx   Diverticulosis    ERECTILE DYSFUNCTION 12/31/2006   FEVER UNSPECIFIED 06/08/2007   GERD 09/29/2006   on meds   GLUCOSE INTOLERANCE 12/31/2006    HEMORRHOIDS 09/29/2006   HYPERLIPIDEMIA 09/29/2006   on meds   HYPERSOMNIA 04/30/2009   HYPERTENSION 09/29/2006   on meds   Impaired fasting glucose 01/01/2007   KNEE PAIN, LEFT 10/18/2008   LOW BACK PAIN 12/31/2006   OBESITY 09/29/2006   Peripheral neuropathy    on Gabapentin   Sleep apnea    uses CPAP   SYNCOPE, VASOVAGAL 03/14/2008   VERTEBRAL FRACTURE 09/29/2006   Past Surgical History:  Procedure Laterality Date   COLONOSCOPY  2018   MS-MAC-suprep(good)-no polyps   drainage of recurrent perirectal abceses     fistula-in-ano repair  2010   HEMORRHOID SURGERY      reports that he quit smoking about 25 years ago. His smoking use included cigarettes. He has a 37.50 pack-year smoking history. He has never used smokeless tobacco. He reports that he does not currently use alcohol. He reports that he does not use drugs. family history includes Allergies in his sister; Colon cancer (age of onset: 22) in his father; Colon polyps (age of onset: 39) in his brother; Colon polyps (age of onset: 103) in his sister; Colon polyps (age of onset: 34) in his father; Diabetes in his mother; Heart disease in his father; Prostate cancer in his father. No Known Allergies Current Outpatient Medications on  File Prior to Visit  Medication Sig Dispense Refill   aspirin 81 MG EC tablet Take 81 mg by mouth daily.     VITAMIN D PO Take 1 tablet by mouth daily at 6 (six) AM.     No current facility-administered medications on file prior to visit.        ROS:  All others reviewed and negative.  Objective        PE:  BP 126/78 (BP Location: Right Arm, Patient Position: Sitting, Cuff Size: Normal)   Pulse (!) 59   Temp 98.2 F (36.8 C) (Oral)   Ht 6' (1.829 m)   Wt 283 lb (128.4 kg)   SpO2 98%   BMI 38.38 kg/m                 Constitutional: Pt appears in NAD               HENT: Head: NCAT.                Right Ear: External ear normal.                 Left Ear: External ear normal.                 Eyes: . Pupils are equal, round, and reactive to light. Conjunctivae and EOM are normal               Nose: without d/c or deformity               Neck: Neck supple. Gross normal ROM               Cardiovascular: Normal rate and regular rhythm.                 Pulmonary/Chest: Effort normal and breath sounds without rales or wheezing.                Abd:  Soft, NT, ND, + BS, no organomegaly               Neurological: Pt is alert. At baseline orientation, motor grossly intact               Skin: Skin is warm. No rashes, no other new lesions, LE edema - none               Psychiatric: Pt behavior is normal without agitation   Micro: none  Cardiac tracings I have personally interpreted today:  none  Pertinent Radiological findings (summarize): none   Lab Results  Component Value Date   WBC 5.8 07/15/2022   HGB 10.7 (L) 07/15/2022   HCT 32.4 (L) 07/15/2022   PLT 211.0 07/15/2022   GLUCOSE 98 07/15/2022   CHOL 131 07/15/2022   TRIG 89.0 07/15/2022   HDL 46.60 07/15/2022   LDLDIRECT 134.8 04/29/2011   LDLCALC 66 07/15/2022   ALT 18 07/15/2022   AST 28 07/15/2022   NA 139 07/15/2022   K 4.4 07/15/2022   CL 103 07/15/2022   CREATININE 1.65 (H) 07/15/2022   BUN 29 (H) 07/15/2022   CO2 28 07/15/2022   TSH 5.02 07/15/2022   PSA 3.15 07/15/2022   HGBA1C 5.0 07/15/2022   MICROALBUR <0.7 07/15/2022   Assessment/Plan:  Tony Savage is a 67 y.o. Black or African American [2] male with  has a past medical history of ABSCESS, GLUTEAL (06/14/2009), Acute gouty arthropathy (10/18/2008), Anal fissure, BACK PAIN (01/16/2010), Cataract (12/2020), Diverticulosis, ERECTILE DYSFUNCTION (  12/31/2006), FEVER UNSPECIFIED (06/08/2007), GERD (09/29/2006), GLUCOSE INTOLERANCE (12/31/2006), HEMORRHOIDS (09/29/2006), HYPERLIPIDEMIA (09/29/2006), HYPERSOMNIA (04/30/2009), HYPERTENSION (09/29/2006), Impaired fasting glucose (01/01/2007), KNEE PAIN, LEFT (10/18/2008), LOW BACK PAIN (12/31/2006),  OBESITY (09/29/2006), Peripheral neuropathy, Sleep apnea, SYNCOPE, VASOVAGAL (03/14/2008), and VERTEBRAL FRACTURE (09/29/2006).  Encounter for well adult exam with abnormal findings Age and sex appropriate education and counseling updated with regular exercise and diet Referrals for preventative services - none needed Immunizations addressed - for prevnar 20 at the pharmacy Smoking counseling  - none needed Evidence for depression or other mood disorder - none significant Most recent labs reviewed. I have personally reviewed and have noted: 1) the patient's medical and social history 2) The patient's current medications and supplements 3) The patient's height, weight, and BMI have been recorded in the chart   Hyperlipidemia Lab Results  Component Value Date   LDLCALC 66 07/15/2022   Stable, pt to continue current statin crestor 20 qd   Essential hypertension BP Readings from Last 3 Encounters:  07/15/22 126/78  02/11/22 136/66  01/01/22 132/80   Stable, pt to continue medical treatment zestroetic 20-12.5 mg qd   Impaired glucose tolerance Lab Results  Component Value Date   HGBA1C 5.0 07/15/2022   Stable, pt to continue current medical treatment  - diet, wt control   CKD (chronic kidney disease) stage 3, GFR 30-59 ml/min (HCC) Lab Results  Component Value Date   CREATININE 1.65 (H) 07/15/2022   Stable overall, cont to avoid nephrotoxins   Lumbar radiculopathy With mod to severe worsening, now with neuro change, declines MRI or PT for now but will let me know  Followup: Return in about 6 months (around 01/14/2023).  Oliver Barre, MD 07/19/2022 3:42 PM [facility]

## 2022-07-19 ENCOUNTER — Encounter: Payer: Self-pay | Admitting: Internal Medicine

## 2022-07-19 NOTE — Assessment & Plan Note (Signed)
With mod to severe worsening, now with neuro change, declines MRI or PT for now but will let me know

## 2022-07-19 NOTE — Assessment & Plan Note (Signed)
Lab Results  Component Value Date   CREATININE 1.65 (H) 07/15/2022   Stable overall, cont to avoid nephrotoxins

## 2022-07-19 NOTE — Assessment & Plan Note (Signed)
Lab Results  Component Value Date   HGBA1C 5.0 07/15/2022   Stable, pt to continue current medical treatment  - diet, wt control

## 2022-07-19 NOTE — Assessment & Plan Note (Signed)
Age and sex appropriate education and counseling updated with regular exercise and diet Referrals for preventative services - none needed Immunizations addressed - for prevnar 20 at the pharmacy Smoking counseling  - none needed Evidence for depression or other mood disorder - none significant Most recent labs reviewed. I have personally reviewed and have noted: 1) the patient's medical and social history 2) The patient's current medications and supplements 3) The patient's height, weight, and BMI have been recorded in the chart  

## 2022-07-19 NOTE — Assessment & Plan Note (Signed)
BP Readings from Last 3 Encounters:  07/15/22 126/78  02/11/22 136/66  01/01/22 132/80   Stable, pt to continue medical treatment zestroetic 20-12.5 mg qd

## 2022-07-19 NOTE — Assessment & Plan Note (Signed)
Lab Results  Component Value Date   LDLCALC 66 07/15/2022   Stable, pt to continue current statin crestor 20 qd

## 2022-08-05 ENCOUNTER — Telehealth: Payer: Self-pay | Admitting: Internal Medicine

## 2022-08-05 NOTE — Telephone Encounter (Signed)
Pt sleep apnea machine amps up at night and blowing air out the side. Pt want to know what he needs to do about this issue . Please advise.

## 2022-08-05 NOTE — Telephone Encounter (Signed)
Very sorry, but I do not treat sleep apnea.  Please consider asking provider he sees for this  I can refer to pulmonary if he likes,    thanks

## 2022-08-19 ENCOUNTER — Ambulatory Visit (INDEPENDENT_AMBULATORY_CARE_PROVIDER_SITE_OTHER): Payer: Medicare PPO | Admitting: Internal Medicine

## 2022-08-19 ENCOUNTER — Encounter: Payer: Self-pay | Admitting: Internal Medicine

## 2022-08-19 VITALS — BP 128/76 | HR 65 | Temp 98.4°F | Ht 72.0 in | Wt 285.0 lb

## 2022-08-19 DIAGNOSIS — M109 Gout, unspecified: Secondary | ICD-10-CM | POA: Diagnosis not present

## 2022-08-19 DIAGNOSIS — N1831 Chronic kidney disease, stage 3a: Secondary | ICD-10-CM | POA: Diagnosis not present

## 2022-08-19 DIAGNOSIS — E538 Deficiency of other specified B group vitamins: Secondary | ICD-10-CM | POA: Insufficient documentation

## 2022-08-19 DIAGNOSIS — R7302 Impaired glucose tolerance (oral): Secondary | ICD-10-CM

## 2022-08-19 DIAGNOSIS — I10 Essential (primary) hypertension: Secondary | ICD-10-CM | POA: Diagnosis not present

## 2022-08-19 MED ORDER — PREDNISONE 10 MG PO TABS
ORAL_TABLET | ORAL | 0 refills | Status: DC
Start: 2022-08-19 — End: 2022-09-01

## 2022-08-19 MED ORDER — METHYLPREDNISOLONE ACETATE 80 MG/ML IJ SUSP
80.0000 mg | Freq: Once | INTRAMUSCULAR | Status: AC
Start: 2022-08-19 — End: 2022-08-19
  Administered 2022-08-19: 80 mg via INTRAMUSCULAR

## 2022-08-19 MED ORDER — TRAMADOL HCL 50 MG PO TABS: 50.0000 mg | ORAL_TABLET | Freq: Four times a day (QID) | ORAL | 0 refills | Status: DC | PRN

## 2022-08-19 NOTE — Assessment & Plan Note (Signed)
Lab Results  Component Value Date   CREATININE 1.65 (H) 07/15/2022   Stable overall, cont to avoid nephrotoxins

## 2022-08-19 NOTE — Assessment & Plan Note (Signed)
mod, for tramadl pain contrl, depomedrol IM 80 mg today, prednisone taper, low purine diet,  to f/u any worsening symptoms or concerns

## 2022-08-19 NOTE — Patient Instructions (Signed)
You had the steroid shot today  Please take all new medication as prescribed  - the prednisone, and the tramadol refill  Please continue all other medications as before, and refills have been done if requested.  Please have the pharmacy call with any other refills you may need.  Please keep your appointments with your specialists as you may have planned

## 2022-08-19 NOTE — Assessment & Plan Note (Signed)
Lab Results  Component Value Date   HGBA1C 5.0 07/15/2022   Stable, pt to continue current medical treatment  - diet, wt control

## 2022-08-19 NOTE — Progress Notes (Signed)
Patient ID: Tony Savage, male   DOB: 1955/11/12, 67 y.o.   MRN: [id number]        Chief Complaint: follow up right foot acute gout, low b12, ckd3a, htn, hyperglycemia       HPI:  Tony Savage is a 67 y.o. male here with c/o 3 days onset severe right first MTP red, pain , swelling c/w first episode acute gouty arthritis.  Has eaten more protein lately  Pt denies chest pain, increased sob or doe, wheezing, orthopnea, PND, increased LE swelling, palpitations, dizziness or syncope.   Pt denies polydipsia, polyuria, or new focal neuro s/s.    Pt denies fever, wt loss, night sweats, loss of appetite, or other constitutional symptoms .         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   in bilateral hips R>L   Anal fissure    BACK PAIN 01/16/2010   Cataract 12/2020   bilaterl sx   Diverticulosis    ERECTILE DYSFUNCTION 12/31/2006   FEVER UNSPECIFIED 06/08/2007   GERD 09/29/2006   on meds   GLUCOSE INTOLERANCE 12/31/2006   HEMORRHOIDS 09/29/2006   HYPERLIPIDEMIA 09/29/2006   on meds   HYPERSOMNIA 04/30/2009   HYPERTENSION 09/29/2006   on meds   Impaired fasting glucose 01/01/2007   KNEE PAIN, LEFT 10/18/2008   LOW BACK PAIN 12/31/2006   OBESITY 09/29/2006   Peripheral neuropathy    on Gabapentin   Sleep apnea    uses CPAP   SYNCOPE, VASOVAGAL 03/14/2008   VERTEBRAL FRACTURE 09/29/2006   Past Surgical History:  Procedure Laterality Date   COLONOSCOPY  2018   MS-MAC-suprep(good)-no polyps   drainage of recurrent perirectal abceses     fistula-in-ano repair  2010   HEMORRHOID SURGERY      reports that he quit smoking about 25 years ago. His smoking use included cigarettes. He started  smoking about 50 years ago. He has a 37.5 pack-year smoking history. He has never used smokeless tobacco. He reports that he does not currently use alcohol. He reports that he does not use drugs. family history includes Allergies in his sister; Colon cancer (age of onset: 18) in his father; Colon polyps (age of onset: 73) in his brother; Colon polyps (age of onset: 22) in his sister; Colon polyps (age of onset: 55) in his father; Diabetes in his mother; Heart disease in his father; Prostate cancer in his father. No Known Allergies Current Outpatient Medications on File Prior to Visit  Medication Sig Dispense Refill   aspirin 81 MG EC tablet Take 81 mg by mouth daily.     fenofibrate 160 MG tablet Take 1 tablet (160 mg total) by mouth daily. 90 tablet 3   gabapentin (NEURONTIN) 300 MG capsule Take 1 capsule (300 mg total) by mouth 3 (three) times daily. 270 capsule 1   lisinopril-hydrochlorothiazide (ZESTORETIC) 20-12.5 MG tablet Take 2 tablets by mouth daily. 180 tablet 3   pantoprazole (PROTONIX) 40 MG tablet TAKE 1 TABLET(40 MG) BY MOUTH TWICE DAILY 180 tablet 3   rosuvastatin (CRESTOR) 20 MG tablet Take 1 tablet (20 mg total) by mouth daily. 90 tablet 3  VITAMIN D PO Take 1 tablet by mouth daily at 6 (six) AM.     No current facility-administered medications on file prior to visit.        ROS:  All others reviewed and negative.  Objective        PE:  BP 128/76 (BP Location: Right Arm, Patient Position: Sitting, Cuff Size: Normal)   Pulse 65   Temp 98.4 F (36.9 C) (Oral)   Ht 6' (1.829 m)   Wt 285 lb (129.3 kg)   SpO2 99%   BMI 38.65 kg/m                 Constitutional: Pt appears in NAD               HENT: Head: NCAT.                Right Ear: External ear normal.                 Left Ear: External ear normal.                Eyes: . Pupils are equal, round, and reactive to light. Conjunctivae and EOM are normal               Nose: without d/c or deformity               Neck:  Neck supple. Gross normal ROM               Cardiovascular: Normal rate and regular rhythm.                 Pulmonary/Chest: Effort normal and breath sounds without rales or wheezing.                Abd:  Soft, NT, ND, + BS, no organomegaly               Neurological: Pt is alert. At baseline orientation, motor grossly intact               Skin: Skin is warm. No rashes, no other new lesions, LE edema - none, right first MTP with 3+ tender, red, swelling               Psychiatric: Pt behavior is normal without agitation   Micro: none  Cardiac tracings I have personally interpreted today:  none  Pertinent Radiological findings (summarize): none   Lab Results  Component Value Date   WBC 5.8 07/15/2022   HGB 10.7 (L) 07/15/2022   HCT 32.4 (L) 07/15/2022   PLT 211.0 07/15/2022   GLUCOSE 98 07/15/2022   CHOL 131 07/15/2022   TRIG 89.0 07/15/2022   HDL 46.60 07/15/2022   LDLDIRECT 134.8 04/29/2011   LDLCALC 66 07/15/2022   ALT 18 07/15/2022   AST 28 07/15/2022   NA 139 07/15/2022   K 4.4 07/15/2022   CL 103 07/15/2022   CREATININE 1.65 (H) 07/15/2022   BUN 29 (H) 07/15/2022   CO2 28 07/15/2022   TSH 5.02 07/15/2022   PSA 3.15 07/15/2022   HGBA1C 5.0 07/15/2022   MICROALBUR <0.7 07/15/2022   Assessment/Plan:  Tony Savage is a 67 y.o. Black or African American [2] male with  has a past medical history of ABSCESS, GLUTEAL (06/14/2009), Acute gouty arthropathy (10/18/2008), Anal fissure, BACK PAIN (01/16/2010), Cataract (12/2020), Diverticulosis, ERECTILE DYSFUNCTION (12/31/2006), FEVER UNSPECIFIED (06/08/2007), GERD (09/29/2006), GLUCOSE INTOLERANCE (12/31/2006), HEMORRHOIDS (09/29/2006), HYPERLIPIDEMIA (09/29/2006), HYPERSOMNIA (04/30/2009), HYPERTENSION (09/29/2006), Impaired fasting  glucose (01/01/2007), KNEE PAIN, LEFT (10/18/2008), LOW BACK PAIN (12/31/2006), OBESITY (09/29/2006), Peripheral neuropathy, Sleep apnea, SYNCOPE, VASOVAGAL (03/14/2008), and VERTEBRAL FRACTURE  (09/29/2006).  Acute gouty arthritis mod, for tramadl pain contrl, depomedrol IM 80 mg today, prednisone taper, low purine diet,  to f/u any worsening symptoms or concerns  B12 deficiency Lab Results  Component Value Date   VITAMINB12 251 07/15/2022   Low, to start oral replacement - b12 1000 mcg qd   CKD (chronic kidney disease) stage 3, GFR 30-59 ml/min (HCC) Lab Results  Component Value Date   CREATININE 1.65 (H) 07/15/2022   Stable overall, cont to avoid nephrotoxins   Essential hypertension BP Readings from Last 3 Encounters:  08/19/22 128/76  07/15/22 126/78  02/11/22 136/66   Stable, pt to continue medical treatment zestroetic 20-12.5 qd   Impaired glucose tolerance Lab Results  Component Value Date   HGBA1C 5.0 07/15/2022   Stable, pt to continue current medical treatment  - diet, wt control  Followup: Return if symptoms worsen or fail to improve.  Oliver Barre, MD 08/19/2022 8:18 PM [facility]

## 2022-08-19 NOTE — Assessment & Plan Note (Signed)
BP Readings from Last 3 Encounters:  08/19/22 128/76  07/15/22 126/78  02/11/22 136/66   Stable, pt to continue medical treatment zestroetic 20-12.5 qd

## 2022-08-19 NOTE — Assessment & Plan Note (Signed)
Lab Results  Component Value Date   VITAMINB12 251 07/15/2022   Low, to start oral replacement - b12 1000 mcg qd

## 2022-09-01 ENCOUNTER — Telehealth: Payer: Self-pay | Admitting: Internal Medicine

## 2022-09-01 MED ORDER — PREDNISONE 10 MG PO TABS: ORAL_TABLET | ORAL | 0 refills | Status: DC

## 2022-09-01 NOTE — Telephone Encounter (Signed)
Pt called wanting predniSONE (DELTASONE) 10 MG tablet or another medication Dr Jonny Ruiz had mention during OV pt can't remember the name of the medication. Pt stated while taking medication his gout was doing good but now it has flared back up. Please advise.

## 2022-09-01 NOTE — Telephone Encounter (Signed)
Ok this is done 

## 2022-09-25 DIAGNOSIS — L700 Acne vulgaris: Secondary | ICD-10-CM | POA: Diagnosis not present

## 2022-10-21 DIAGNOSIS — G4733 Obstructive sleep apnea (adult) (pediatric): Secondary | ICD-10-CM | POA: Diagnosis not present

## 2022-10-23 DIAGNOSIS — L700 Acne vulgaris: Secondary | ICD-10-CM | POA: Diagnosis not present

## 2022-10-23 DIAGNOSIS — L0201 Cutaneous abscess of face: Secondary | ICD-10-CM | POA: Diagnosis not present

## 2022-11-03 ENCOUNTER — Telehealth: Payer: Self-pay | Admitting: Internal Medicine

## 2022-11-03 MED ORDER — PREDNISONE 10 MG PO TABS: ORAL_TABLET | ORAL | 0 refills | Status: AC

## 2022-11-03 NOTE — Telephone Encounter (Signed)
Ok this is done 

## 2022-11-03 NOTE — Telephone Encounter (Signed)
Patient is scheduled for an OV tomorrow with Dr. Jonny Ruiz. He would like to know if Dr. Jonny Ruiz would send in medication to help with his gout so that he does not need to come in for the appointment. Best callback is [phone].

## 2022-11-04 ENCOUNTER — Ambulatory Visit: Payer: Medicare PPO | Admitting: Internal Medicine

## 2022-11-04 NOTE — Telephone Encounter (Signed)
Called and let Pt know

## 2023-01-10 ENCOUNTER — Other Ambulatory Visit: Payer: Self-pay | Admitting: Internal Medicine

## 2023-01-12 ENCOUNTER — Other Ambulatory Visit: Payer: Self-pay

## 2023-03-17 ENCOUNTER — Encounter: Payer: Medicare PPO | Admitting: Internal Medicine

## 2023-03-17 ENCOUNTER — Ambulatory Visit: Payer: Medicare PPO

## 2023-03-21 ENCOUNTER — Other Ambulatory Visit: Payer: Self-pay | Admitting: Internal Medicine

## 2023-03-24 ENCOUNTER — Ambulatory Visit: Payer: Medicare PPO | Admitting: Internal Medicine

## 2023-03-24 ENCOUNTER — Encounter: Payer: Self-pay | Admitting: Internal Medicine

## 2023-03-24 ENCOUNTER — Other Ambulatory Visit: Payer: Self-pay | Admitting: Internal Medicine

## 2023-03-24 VITALS — BP 144/82 | HR 61 | Temp 97.8°F | Ht 72.0 in | Wt 290.0 lb

## 2023-03-24 DIAGNOSIS — R7302 Impaired glucose tolerance (oral): Secondary | ICD-10-CM | POA: Diagnosis not present

## 2023-03-24 DIAGNOSIS — E7800 Pure hypercholesterolemia, unspecified: Secondary | ICD-10-CM | POA: Diagnosis not present

## 2023-03-24 DIAGNOSIS — M5416 Radiculopathy, lumbar region: Secondary | ICD-10-CM

## 2023-03-24 DIAGNOSIS — I10 Essential (primary) hypertension: Secondary | ICD-10-CM

## 2023-03-24 DIAGNOSIS — E559 Vitamin D deficiency, unspecified: Secondary | ICD-10-CM | POA: Diagnosis not present

## 2023-03-24 DIAGNOSIS — Z23 Encounter for immunization: Secondary | ICD-10-CM | POA: Diagnosis not present

## 2023-03-24 DIAGNOSIS — Z0001 Encounter for general adult medical examination with abnormal findings: Secondary | ICD-10-CM

## 2023-03-24 DIAGNOSIS — Z125 Encounter for screening for malignant neoplasm of prostate: Secondary | ICD-10-CM

## 2023-03-24 DIAGNOSIS — N1832 Chronic kidney disease, stage 3b: Secondary | ICD-10-CM

## 2023-03-24 DIAGNOSIS — Z0000 Encounter for general adult medical examination without abnormal findings: Secondary | ICD-10-CM

## 2023-03-24 DIAGNOSIS — E538 Deficiency of other specified B group vitamins: Secondary | ICD-10-CM

## 2023-03-24 DIAGNOSIS — N1831 Chronic kidney disease, stage 3a: Secondary | ICD-10-CM

## 2023-03-24 LAB — CBC WITH DIFFERENTIAL/PLATELET
Basophils Absolute: 0 10*3/uL (ref 0.0–0.1)
Basophils Relative: 0.8 % (ref 0.0–3.0)
Eosinophils Absolute: 0.1 10*3/uL (ref 0.0–0.7)
Eosinophils Relative: 2.2 % (ref 0.0–5.0)
HCT: 32.8 % — ABNORMAL LOW (ref 39.0–52.0)
Hemoglobin: 10.8 g/dL — ABNORMAL LOW (ref 13.0–17.0)
Lymphocytes Relative: 25.3 % (ref 12.0–46.0)
Lymphs Abs: 1.4 10*3/uL (ref 0.7–4.0)
MCHC: 33.1 g/dL (ref 30.0–36.0)
MCV: 99.8 fL (ref 78.0–100.0)
Monocytes Absolute: 0.6 10*3/uL (ref 0.1–1.0)
Monocytes Relative: 11.2 % (ref 3.0–12.0)
Neutro Abs: 3.4 10*3/uL (ref 1.4–7.7)
Neutrophils Relative %: 60.5 % (ref 43.0–77.0)
Platelets: 190 10*3/uL (ref 150.0–400.0)
RBC: 3.28 Mil/uL — ABNORMAL LOW (ref 4.22–5.81)
RDW: 13.5 % (ref 11.5–15.5)
WBC: 5.7 10*3/uL (ref 4.0–10.5)

## 2023-03-24 LAB — BASIC METABOLIC PANEL
BUN: 35 mg/dL — ABNORMAL HIGH (ref 6–23)
CO2: 27 meq/L (ref 19–32)
Calcium: 9.3 mg/dL (ref 8.4–10.5)
Chloride: 104 meq/L (ref 96–112)
Creatinine, Ser: 1.94 mg/dL — ABNORMAL HIGH (ref 0.40–1.50)
GFR: 35.57 mL/min — ABNORMAL LOW (ref >60.00)
Glucose, Bld: 97 mg/dL (ref 70–99)
Potassium: 4.8 meq/L (ref 3.5–5.1)
Sodium: 140 meq/L (ref 135–145)

## 2023-03-24 LAB — VITAMIN D 25 HYDROXY (VIT D DEFICIENCY, FRACTURES): VITD: 55.55 ng/mL (ref 30.00–100.00)

## 2023-03-24 LAB — HEPATIC FUNCTION PANEL
ALT: 13 U/L (ref 0–53)
AST: 24 U/L (ref 0–37)
Albumin: 4.3 g/dL (ref 3.5–5.2)
Alkaline Phosphatase: 95 U/L (ref 39–117)
Bilirubin, Direct: 0.2 mg/dL (ref 0.0–0.3)
Total Bilirubin: 0.8 mg/dL (ref 0.2–1.2)
Total Protein: 6.9 g/dL (ref 6.0–8.3)

## 2023-03-24 LAB — URINALYSIS, ROUTINE W REFLEX MICROSCOPIC
Bilirubin Urine: NEGATIVE
Hgb urine dipstick: NEGATIVE
Ketones, ur: NEGATIVE
Leukocytes,Ua: NEGATIVE
Nitrite: NEGATIVE
RBC / HPF: NONE SEEN (ref 0–?)
Specific Gravity, Urine: 1.01 (ref 1.000–1.030)
Total Protein, Urine: NEGATIVE
Urine Glucose: NEGATIVE
Urobilinogen, UA: 1 (ref 0.0–1.0)
WBC, UA: NONE SEEN (ref 0–?)
pH: 6.5 (ref 5.0–8.0)

## 2023-03-24 LAB — LIPID PANEL
Cholesterol: 134 mg/dL (ref 0–200)
HDL: 62.5 mg/dL (ref >39.00)
LDL Cholesterol: 62 mg/dL (ref 0–99)
NonHDL: 71.61
Total CHOL/HDL Ratio: 2
Triglycerides: 47 mg/dL (ref 0.0–149.0)
VLDL: 9.4 mg/dL (ref 0.0–40.0)

## 2023-03-24 LAB — PSA: PSA: 3.43 ng/mL (ref 0.10–4.00)

## 2023-03-24 LAB — MICROALBUMIN / CREATININE URINE RATIO
Creatinine,U: 82.9 mg/dL
Microalb Creat Ratio: 8.4 mg/g (ref 0.0–30.0)
Microalb, Ur: <0.7 mg/dL (ref 0.0–1.9)

## 2023-03-24 LAB — TSH: TSH: 3.58 u[IU]/mL (ref 0.35–5.50)

## 2023-03-24 LAB — VITAMIN B12: Vitamin B-12: 229 pg/mL (ref 211–911)

## 2023-03-24 LAB — HEMOGLOBIN A1C: Hgb A1c MFr Bld: 5 % (ref 4.6–6.5)

## 2023-03-24 MED ORDER — TRAMADOL HCL 50 MG PO TABS: 50.0000 mg | ORAL_TABLET | Freq: Four times a day (QID) | ORAL | 2 refills | Status: AC | PRN

## 2023-03-24 MED ORDER — ROSUVASTATIN CALCIUM 20 MG PO TABS: 20.0000 mg | ORAL_TABLET | Freq: Every day | ORAL | 3 refills | Status: DC

## 2023-03-24 NOTE — Patient Instructions (Addendum)
 You had the Prevnar 20 pneumonia shot today  Please continue all other medications as before, and refills have been done if requested.  Please have the pharmacy call with any other refills you may need.  Please continue your efforts at being more active, low cholesterol diet, and weight control.  You are otherwise up to date with prevention measures today.  Please keep your appointments with your specialists as you may have planned  Please call if you change your mind about having the MRI for the lower back or referral to orthopedic  Please go to the LAB at the blood drawing area for the tests to be done  You will be contacted by phone if any changes need to be made immediately.  Otherwise, you will receive a letter about your results with an explanation, but please check with MyChart first.  Please make an Appointment to return in 6 months, or sooner if needed, also with Lab Appointment for testing done 3-5 days before at the FIRST FLOOR Lab (so this is for TWO appointments - please see the scheduling desk as you leave)

## 2023-03-24 NOTE — Progress Notes (Signed)
 Patient ID: Tony Savage, male   DOB: [birth year], 68 y.o.   MRN: [id number]         Chief Complaint:: wellness exam and right lumbar radiculopathy, low b12, ckd3a, htn, hld, hyperglycemia       HPI:  Tony Savage is a 68 y.o. male here for wellness exam; for prevnar 20 today, up to date                Also has flare x 3 days severe right lower back pain no improved today, declines need for further eval or tx.  Pt denies chest pain, increased sob or doe, wheezing, orthopnea, PND, increased LE swelling, palpitations, dizziness or syncope.   Pt denies polydipsia, polyuria, or new focal neuro s/s.    Pt denies fever, wt loss, night sweats, loss of appetite, or other constitutional symptoms     Wt Readings from Last 3 Encounters:  03/24/23 290 lb (131.5 kg)  08/19/22 285 lb (129.3 kg)  07/15/22 283 lb (128.4 kg)   BP Readings from Last 3 Encounters:  03/24/23 (!) 144/82  08/19/22 128/76  07/15/22 126/78   Immunization History  Administered Date(s) Administered   Fluad Quad(high Dose 65+) 10/31/2022   Influenza,inj,Quad PF,6+ Mos 11/12/2019, 10/08/2021   Influenza,inj,Quad PF,6-35 Mos 10/28/2018   Influenza-Unspecified 12/03/2015, 01/07/2021   PFIZER(Purple Top)SARS-COV-2 Vaccination 04/03/2019, 05/03/2019, 12/31/2019   PNEUMOCOCCAL CONJUGATE-20 03/24/2023   Td 03/14/2008   Tdap 07/22/2019   Zoster Recombinant(Shingrix) 03/03/2022, 05/05/2022   Health Maintenance Due  Topic Date Due   Medicare Annual Wellness (AWV)  Never done      Past Medical History:  Diagnosis Date   ABSCESS, GLUTEAL 06/14/2009   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   in bilateral hips R>L   Anal fissure    BACK PAIN 01/16/2010   Cataract 12/2020   bilaterl sx   Diverticulosis    ERECTILE DYSFUNCTION 12/31/2006   FEVER UNSPECIFIED 06/08/2007   GERD 09/29/2006   on meds   GLUCOSE INTOLERANCE 12/31/2006   HEMORRHOIDS 09/29/2006   HYPERLIPIDEMIA 09/29/2006   on meds   HYPERSOMNIA 04/30/2009    HYPERTENSION 09/29/2006   on meds   Impaired fasting glucose 01/01/2007   KNEE PAIN, LEFT 10/18/2008   LOW BACK PAIN 12/31/2006   OBESITY 09/29/2006   Peripheral neuropathy    on Gabapentin   Sleep apnea    uses CPAP   SYNCOPE, VASOVAGAL 03/14/2008   VERTEBRAL FRACTURE 09/29/2006   Past Surgical History:  Procedure Laterality Date   COLONOSCOPY  2018   MS-MAC-suprep(good)-no polyps   drainage of recurrent perirectal abceses     fistula-in-ano repair  2010   HEMORRHOID SURGERY      reports that he quit smoking about 26 years ago. His smoking use included cigarettes. He started smoking about 51 years ago. He has a 37.5 pack-year smoking history. He has never used smokeless tobacco. He reports that he does not currently use alcohol. He reports that he does not use drugs. family history includes Allergies in his sister; Colon cancer (age of onset: 10) in his father; Colon polyps (age of onset: 63) in his brother; Colon polyps (age of onset: 53) in his sister; Colon polyps (age of onset: 39) in his father; Diabetes in his mother; Heart disease in his father; Prostate cancer in his father. No Known Allergies Current Outpatient Medications on File Prior to Visit  Medication Sig Dispense Refill   aspirin 81 MG EC tablet Take 81 mg by mouth daily.  fenofibrate 160 MG tablet Take 1 tablet (160 mg total) by mouth daily. 90 tablet 3   gabapentin (NEURONTIN) 300 MG capsule TAKE 1 CAPSULE(300 MG) BY MOUTH THREE TIMES DAILY 270 capsule 1   lisinopril-hydrochlorothiazide (ZESTORETIC) 20-12.5 MG tablet Take 2 tablets by mouth daily. 180 tablet 3   pantoprazole (PROTONIX) 40 MG tablet TAKE 1 TABLET(40 MG) BY MOUTH TWICE DAILY 180 tablet 3   predniSONE (DELTASONE) 10 MG tablet 3 tabs by mouth per day for 3 days,2tabs per day for 3 days,1tab per day for 3 days 18 tablet 0   VITAMIN D PO Take 1 tablet by mouth daily at 6 (six) AM.     No current facility-administered medications on file prior to  visit.        ROS:  All others reviewed and negative.  Objective        PE:  BP (!) 144/82 (BP Location: Left Arm, Patient Position: Sitting, Cuff Size: Normal)   Pulse 61   Temp 97.8 F (36.6 C) (Oral)   Ht 6' (1.829 m)   Wt 290 lb (131.5 kg)   SpO2 99%   BMI 39.33 kg/m                 Constitutional: Pt appears in NAD               HENT: Head: NCAT.                Right Ear: External ear normal.                 Left Ear: External ear normal.                Eyes: . Pupils are equal, round, and reactive to light. Conjunctivae and EOM are normal               Nose: without d/c or deformity               Neck: Neck supple. Gross normal ROM               Cardiovascular: Normal rate and regular rhythm.                 Pulmonary/Chest: Effort normal and breath sounds without rales or wheezing.                Abd:  Soft, NT, ND, + BS, no organomegaly               Neurological: Pt is alert. At baseline orientation, motor grossly intact               Skin: Skin is warm. No rashes, no other new lesions, LE edema - none               Psychiatric: Pt behavior is normal without agitation   Micro: none  Cardiac tracings I have personally interpreted today:  ECG - sinus bradycardia 56  Pertinent Radiological findings (summarize): none   Lab Results  Component Value Date   WBC 5.7 03/24/2023   HGB 10.8 (L) 03/24/2023   HCT 32.8 (L) 03/24/2023   PLT 190.0 03/24/2023   GLUCOSE 97 03/24/2023   CHOL 134 03/24/2023   TRIG 47.0 03/24/2023   HDL 62.50 03/24/2023   LDLDIRECT 134.8 04/29/2011   LDLCALC 62 03/24/2023   ALT 13 03/24/2023   AST 24 03/24/2023   NA 140 03/24/2023   K 4.8 03/24/2023   CL 104 03/24/2023  CREATININE 1.94 (H) 03/24/2023   BUN 35 (H) 03/24/2023   CO2 27 03/24/2023   TSH 3.58 03/24/2023   PSA 3.43 03/24/2023   HGBA1C 5.0 03/24/2023   MICROALBUR <0.7 03/24/2023   Assessment/Plan:  Tony Savage is a 68 y.o. Black or African American [2] male with  has  a past medical history of ABSCESS, GLUTEAL (06/14/2009), Acute gouty arthropathy (10/18/2008), Anal fissure, BACK PAIN (01/16/2010), Cataract (12/2020), Diverticulosis, ERECTILE DYSFUNCTION (12/31/2006), FEVER UNSPECIFIED (06/08/2007), GERD (09/29/2006), GLUCOSE INTOLERANCE (12/31/2006), HEMORRHOIDS (09/29/2006), HYPERLIPIDEMIA (09/29/2006), HYPERSOMNIA (04/30/2009), HYPERTENSION (09/29/2006), Impaired fasting glucose (01/01/2007), KNEE PAIN, LEFT (10/18/2008), LOW BACK PAIN (12/31/2006), OBESITY (09/29/2006), Peripheral neuropathy, Sleep apnea, SYNCOPE, VASOVAGAL (03/14/2008), and VERTEBRAL FRACTURE (09/29/2006).  Encounter for well adult exam with abnormal findings Age and sex appropriate education and counseling updated with regular exercise and diet Referrals for preventative services - none needed Immunizations addressed - for prevnar 20 Smoking counseling  - none needed Evidence for depression or other mood disorder - none significant Most recent labs reviewed. I have personally reviewed and have noted: 1) the patient's medical and social history 2) The patient's current medications and supplements 3) The patient's height, weight, and BMI have been recorded in the chart   B12 deficiency Lab Results  Component Value Date   VITAMINB12 229 03/24/2023   Low, to start  oral replacement - b12 1000 mcg qd   CKD (chronic kidney disease) stage 3, GFR 30-59 ml/min (HCC) Lab Results  Component Value Date   CREATININE 1.94 (H) 03/24/2023   Stable overall, cont to avoid nephrotoxins   Essential hypertension BP Readings from Last 3 Encounters:  03/24/23 (!) 144/82  08/19/22 128/76  07/15/22 126/78   Uncontrolled here, pt states controlled at home,, pt to continue medical treatment zestoretic 20 12.5 every day, declines further change   Hyperlipidemia Lab Results  Component Value Date   LDLCALC 62 03/24/2023   Stable, pt to continue current statin crestor 20 qd   Impaired  glucose tolerance Lab Results  Component Value Date   HGBA1C 5.0 03/24/2023   Stable, pt to continue current medical treatment   - diet, wt control   Lumbar radiculopathy With recent flare symptoms of pain, now improved, cont current tx  Followup: Return in about 6 months (around 09/21/2023).  Oliver Barre, MD 03/28/2023 5:26 PM [facility]

## 2023-03-28 ENCOUNTER — Encounter: Payer: Self-pay | Admitting: Internal Medicine

## 2023-03-28 DIAGNOSIS — E559 Vitamin D deficiency, unspecified: Secondary | ICD-10-CM | POA: Insufficient documentation

## 2023-03-28 NOTE — Assessment & Plan Note (Signed)
 Lab Results  Component Value Date   HGBA1C 5.0 03/24/2023   Stable, pt to continue current medical treatment   - diet, wt control

## 2023-03-28 NOTE — Assessment & Plan Note (Signed)
 Lab Results  Component Value Date   CREATININE 1.94 (H) 03/24/2023   Stable overall, cont to avoid nephrotoxins

## 2023-03-28 NOTE — Assessment & Plan Note (Signed)
 Lab Results  Component Value Date   VITAMINB12 229 03/24/2023   Low, to start  oral replacement - b12 1000 mcg qd

## 2023-03-28 NOTE — Assessment & Plan Note (Signed)
 BP Readings from Last 3 Encounters:  03/24/23 (!) 144/82  08/19/22 128/76  07/15/22 126/78   Uncontrolled here, pt states controlled at home,, pt to continue medical treatment zestoretic 20 12.5 every day, declines further change

## 2023-03-28 NOTE — Assessment & Plan Note (Signed)
 With recent flare symptoms of pain, now improved, cont current tx

## 2023-03-28 NOTE — Assessment & Plan Note (Signed)
 Lab Results  Component Value Date   LDLCALC 62 03/24/2023   Stable, pt to continue current statin crestor 20 qd

## 2023-03-28 NOTE — Assessment & Plan Note (Addendum)
 Age and sex appropriate education and counseling updated with regular exercise and diet Referrals for preventative services - none needed Immunizations addressed - for prevnar 20 Smoking counseling  - none needed Evidence for depression or other mood disorder - none significant Most recent labs reviewed. I have personally reviewed and have noted: 1) the patient's medical and social history 2) The patient's current medications and supplements 3) The patient's height, weight, and BMI have been recorded in the chart

## 2023-08-05 ENCOUNTER — Other Ambulatory Visit: Payer: Self-pay | Admitting: Internal Medicine

## 2023-08-20 ENCOUNTER — Telehealth: Payer: Self-pay | Admitting: Internal Medicine

## 2023-08-20 NOTE — Telephone Encounter (Signed)
 Copied from CRM #[id number]. Topic: Referral - Question >> Aug 20, 2023 11:59 AM Deaijah H wrote: Reason for CRM: Patient called in stating he received a call from kidney doctor and does remember that the calibration was off when results were taken with Dr. Norleen, but would like to know if he should be expecting a call from them due to it being July and referral was sent in February. Please call [phone] to confirm

## 2023-09-09 DIAGNOSIS — G4733 Obstructive sleep apnea (adult) (pediatric): Secondary | ICD-10-CM | POA: Diagnosis not present

## 2023-09-14 NOTE — Telephone Encounter (Unsigned)
 Copied from CRM #[id number]. Topic: General - Other >> Sep 14, 2023 11:16 AM Franky GRADE wrote: Reason for CRM: Patient is calling because he is scheduled to see the kidney specialist on Friday 09/25/2023 and wanted to know if he needed to follow up with Dr.[name] after or keep his current appointment with Dr.[name] on 09/21/2023.

## 2023-09-18 ENCOUNTER — Other Ambulatory Visit: Payer: Self-pay | Admitting: Nephrology

## 2023-09-18 DIAGNOSIS — N189 Chronic kidney disease, unspecified: Secondary | ICD-10-CM | POA: Diagnosis not present

## 2023-09-18 DIAGNOSIS — I129 Hypertensive chronic kidney disease with stage 1 through stage 4 chronic kidney disease, or unspecified chronic kidney disease: Secondary | ICD-10-CM | POA: Diagnosis not present

## 2023-09-18 DIAGNOSIS — D631 Anemia in chronic kidney disease: Secondary | ICD-10-CM | POA: Diagnosis not present

## 2023-09-18 DIAGNOSIS — E559 Vitamin D deficiency, unspecified: Secondary | ICD-10-CM | POA: Diagnosis not present

## 2023-09-18 DIAGNOSIS — N1832 Chronic kidney disease, stage 3b: Secondary | ICD-10-CM | POA: Diagnosis not present

## 2023-09-21 ENCOUNTER — Encounter: Payer: Self-pay | Admitting: Internal Medicine

## 2023-09-21 ENCOUNTER — Ambulatory Visit: Admitting: Internal Medicine

## 2023-09-21 ENCOUNTER — Encounter: Payer: Self-pay | Admitting: Nephrology

## 2023-09-21 VITALS — BP 100/64 | HR 61 | Temp 98.0°F | Ht 72.0 in | Wt 291.8 lb

## 2023-09-21 DIAGNOSIS — R7302 Impaired glucose tolerance (oral): Secondary | ICD-10-CM | POA: Diagnosis not present

## 2023-09-21 DIAGNOSIS — Z125 Encounter for screening for malignant neoplasm of prostate: Secondary | ICD-10-CM | POA: Diagnosis not present

## 2023-09-21 DIAGNOSIS — E538 Deficiency of other specified B group vitamins: Secondary | ICD-10-CM | POA: Diagnosis not present

## 2023-09-21 DIAGNOSIS — I10 Essential (primary) hypertension: Secondary | ICD-10-CM

## 2023-09-21 DIAGNOSIS — M5416 Radiculopathy, lumbar region: Secondary | ICD-10-CM

## 2023-09-21 DIAGNOSIS — E7800 Pure hypercholesterolemia, unspecified: Secondary | ICD-10-CM | POA: Diagnosis not present

## 2023-09-21 DIAGNOSIS — N1831 Chronic kidney disease, stage 3a: Secondary | ICD-10-CM

## 2023-09-21 DIAGNOSIS — E559 Vitamin D deficiency, unspecified: Secondary | ICD-10-CM | POA: Diagnosis not present

## 2023-09-21 LAB — LAB REPORT - SCANNED
Creatinine, POC: 147 mg/dL
EGFR: 39

## 2023-09-21 MED ORDER — EMPAGLIFLOZIN 25 MG PO TABS
25.0000 mg | ORAL_TABLET | Freq: Every day | ORAL | 3 refills | Status: AC
Start: 2023-09-21 — End: ?

## 2023-09-21 NOTE — Progress Notes (Signed)
 Patient ID: Tony Savage, male   DOB: 15-Feb-1955, 68 y.o.   MRN: [id number]        Chief Complaint: follow up right lumbar radiculopathy, htn, ckd3b, hld, hyperglycemia       HPI:  Tony Savage is a 68 y.o. male here with c/o chronic persistent right lower back pain now with 2-3 mo worsening right lower back pain with radiation to the right leg with numbness and mild weakness.  No falls.  Last MRI 2013 with known L5 bilateral neuroforaminal stenosis, and large disc herniation.  Has seen renal recently with lab just 3 days ago, does not want more labs.  Suggested d/c fenofiobrate and start jardiance .  F/u planned for 3 mo. Pt denies chest pain, increased sob or doe, wheezing, orthopnea, PND, increased LE swelling, palpitations, dizziness or syncope.   Pt denies polydipsia, polyuria.         Wt Readings from Last 3 Encounters:  09/21/23 291 lb 12.8 oz (132.4 kg)  03/24/23 290 lb (131.5 kg)  08/19/22 285 lb (129.3 kg)   BP Readings from Last 3 Encounters:  09/21/23 100/64  03/24/23 (!) 144/82  08/19/22 128/76         Past Medical History:  Diagnosis Date   ABSCESS, GLUTEAL 06/14/2009   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   in bilateral hips R>L   Anal fissure    BACK PAIN 01/16/2010   Cataract 12/2020   bilaterl sx   Diverticulosis    ERECTILE DYSFUNCTION 12/31/2006   FEVER UNSPECIFIED 06/08/2007   GERD 09/29/2006   on meds   GLUCOSE INTOLERANCE 12/31/2006   HEMORRHOIDS 09/29/2006   HYPERLIPIDEMIA 09/29/2006   on meds   HYPERSOMNIA 04/30/2009   HYPERTENSION 09/29/2006   on meds   Impaired fasting glucose 01/01/2007   KNEE PAIN, LEFT 10/18/2008   LOW BACK PAIN 12/31/2006   OBESITY 09/29/2006   Peripheral neuropathy    on Gabapentin    Sleep apnea    uses CPAP   SYNCOPE, VASOVAGAL 03/14/2008   VERTEBRAL FRACTURE 09/29/2006   Past Surgical History:  Procedure Laterality Date   COLONOSCOPY  2018   MS-MAC-suprep(good)-no polyps   drainage of recurrent perirectal  abceses     fistula-in-ano repair  2010   HEMORRHOID SURGERY      reports that he quit smoking about 26 years ago. His smoking use included cigarettes. He started smoking about 51 years ago. He has a 37.5 pack-year smoking history. He has never used smokeless tobacco. He reports that he does not currently use alcohol. He reports that he does not use drugs. family history includes Allergies in his sister; Colon cancer (age of onset: 75) in his father; Colon polyps (age of onset: 24) in his brother; Colon polyps (age of onset: 50) in his sister; Colon polyps (age of onset: 74) in his father; Diabetes in his mother; Heart disease in his father; Prostate cancer in his father. No Known Allergies Current Outpatient Medications on File Prior to Visit  Medication Sig Dispense Refill   aspirin 81 MG EC tablet Take 81 mg by mouth daily.     gabapentin  (NEURONTIN ) 300 MG capsule TAKE 1 CAPSULE(300 MG) BY MOUTH THREE TIMES DAILY 270 capsule 1   lisinopril -hydrochlorothiazide  (ZESTORETIC ) 20-12.5 MG tablet TAKE 2 TABLETS BY MOUTH DAILY 180 tablet 3   pantoprazole  (PROTONIX ) 40 MG tablet TAKE 1 TABLET(40 MG) BY MOUTH TWICE DAILY 180 tablet 3   predniSONE  (DELTASONE ) 10 MG tablet 3 tabs by mouth per day for  3 days,2tabs per day for 3 days,1tab per day for 3 days 18 tablet 0   rosuvastatin  (CRESTOR ) 20 MG tablet Take 1 tablet (20 mg total) by mouth daily. 90 tablet 3   traMADol  (ULTRAM ) 50 MG tablet Take 1 tablet (50 mg total) by mouth every 6 (six) hours as needed. 30 tablet 2   VITAMIN D  PO Take 1 tablet by mouth daily at 6 (six) AM.     No current facility-administered medications on file prior to visit.        ROS:  All others reviewed and negative.  Objective        PE:  BP 100/64   Pulse 61   Temp 98 F (36.7 C)   Ht 6' (1.829 m)   Wt 291 lb 12.8 oz (132.4 kg)   SpO2 99%   BMI 39.58 kg/m                 Constitutional: Pt appears in NAD               HENT: Head: NCAT.                Right  Ear: External ear normal.                 Left Ear: External ear normal.                Eyes: . Pupils are equal, round, and reactive to light. Conjunctivae and EOM are normal               Nose: without d/c or deformity               Neck: Neck supple. Gross normal ROM               Cardiovascular: Normal rate and regular rhythm.                 Pulmonary/Chest: Effort normal and breath sounds without rales or wheezing.                Abd:  Soft, NT, ND, + BS, no organomegaly               Neurological: Pt is alert. At baseline orientation, motor with 4/5 RLE weakness               Skin: Skin is warm. No rashes, no other new lesions, LE edema - none               Psychiatric: Pt behavior is normal without agitation   Micro: none  Cardiac tracings I have personally interpreted today:  none  Pertinent Radiological findings (summarize): none   Lab Results  Component Value Date   WBC 5.7 03/24/2023   HGB 10.8 (L) 03/24/2023   HCT 32.8 (L) 03/24/2023   PLT 190.0 03/24/2023   GLUCOSE 97 03/24/2023   CHOL 134 03/24/2023   TRIG 47.0 03/24/2023   HDL 62.50 03/24/2023   LDLDIRECT 134.8 04/29/2011   LDLCALC 62 03/24/2023   ALT 13 03/24/2023   AST 24 03/24/2023   NA 140 03/24/2023   K 4.8 03/24/2023   CL 104 03/24/2023   CREATININE 1.94 (H) 03/24/2023   BUN 35 (H) 03/24/2023   CO2 27 03/24/2023   TSH 3.58 03/24/2023   PSA 3.43 03/24/2023   HGBA1C 5.0 03/24/2023   MICROALBUR <0.7 03/24/2023   Assessment/Plan:  Tony Savage is a 68 y.o. Black or African American [2]  male with  has a past medical history of ABSCESS, GLUTEAL (06/14/2009), Acute gouty arthropathy (10/18/2008), Anal fissure, BACK PAIN (01/16/2010), Cataract (12/2020), Diverticulosis, ERECTILE DYSFUNCTION (12/31/2006), FEVER UNSPECIFIED (06/08/2007), GERD (09/29/2006), GLUCOSE INTOLERANCE (12/31/2006), HEMORRHOIDS (09/29/2006), HYPERLIPIDEMIA (09/29/2006), HYPERSOMNIA (04/30/2009), HYPERTENSION (09/29/2006), Impaired  fasting glucose (01/01/2007), KNEE PAIN, LEFT (10/18/2008), LOW BACK PAIN (12/31/2006), OBESITY (09/29/2006), Peripheral neuropathy, Sleep apnea, SYNCOPE, VASOVAGAL (03/14/2008), and VERTEBRAL FRACTURE (09/29/2006).  B12 deficiency Lab Results  Component Value Date   VITAMINB12 229 03/24/2023   Low, to start oral replacement - b12 1000 mcg qd   CKD (chronic kidney disease) stage 3, GFR 30-59 ml/min (HCC) Lab Results  Component Value Date   CREATININE 1.94 (H) 03/24/2023   Stable overall, cont to avoid nephrotoxins, to start jardiance  25 qd   Essential hypertension BP Readings from Last 3 Encounters:  09/21/23 100/64  03/24/23 (!) 144/82  08/19/22 128/76   Stable, pt to continue medical treatment zestroetic 20 - 12.5 mg qd   Hyperlipidemia Lab Results  Component Value Date   LDLCALC 62 03/24/2023   Stable, pt to continue current statin crestor  20 mg every day, d/c fenofibrate    Impaired glucose tolerance Lab Results  Component Value Date   HGBA1C 5.0 03/24/2023   Stable, pt to continue current medical treatment  - diet,wt control   Right lumbar radiculopathy With known lumbar djd disc herniation, now with worsening RLE symptoms, for LS spine mri, tramadol  50 mg qid prn, declines surgical referral until MRI done  Vitamin D  deficiency Last vitamin D  Lab Results  Component Value Date   VD25OH 55.55 03/24/2023   Stable, cont oral replacement  Followup: Return in about 6 months (around 03/23/2024).  Lynwood Rush, MD 09/21/2023 8:35 PM [facility]

## 2023-09-21 NOTE — Assessment & Plan Note (Addendum)
 Lab Results  Component Value Date   HGBA1C 5.0 03/24/2023   Stable, pt to continue current medical treatment   - diet, wt control

## 2023-09-21 NOTE — Assessment & Plan Note (Signed)
 Last vitamin D  Lab Results  Component Value Date   VD25OH 55.55 03/24/2023   Stable, cont oral replacement

## 2023-09-21 NOTE — Assessment & Plan Note (Signed)
 Lab Results  Component Value Date   VITAMINB12 229 03/24/2023   Low, to start  oral replacement - b12 1000 mcg qd

## 2023-09-21 NOTE — Assessment & Plan Note (Signed)
 With known lumbar djd disc herniation, now with worsening RLE symptoms, for LS spine mri, tramadol  50 mg qid prn, declines surgical referral until MRI done

## 2023-09-21 NOTE — Assessment & Plan Note (Addendum)
 Lab Results  Component Value Date   CREATININE 1.94 (H) 03/24/2023   Stable overall, cont to avoid nephrotoxins, to start jardiance  25 qd

## 2023-09-21 NOTE — Assessment & Plan Note (Addendum)
 Lab Results  Component Value Date   LDLCALC 62 03/24/2023   Stable, pt to continue current statin crestor  20 mg every day, d/c fenofibrate 

## 2023-09-21 NOTE — Patient Instructions (Signed)
 Ok to STOP the fenofibrate   Please take all new medication as prescribed - the jardiance  25 mg per day for kidneys and sugar  Please continue all other medications as before, and refills have been done if requested.  Please have the pharmacy call with any other refills you may need.  Please continue your efforts at being more active, low cholesterol diet, and weight control.  Please keep your appointments with your specialists as you may have planned  We can hold on lab testing today  Please make an Appointment to return in 6 months, or sooner if needed, also with Lab Appointment for testing done 3-5 days before at the FIRST FLOOR Lab (so this is for TWO appointments - please see the scheduling desk as you leave)

## 2023-09-21 NOTE — Assessment & Plan Note (Signed)
 BP Readings from Last 3 Encounters:  09/21/23 100/64  03/24/23 (!) 144/82  08/19/22 128/76   Stable, pt to continue medical treatment zestroetic 20 - 12.5 mg qd

## 2023-09-22 ENCOUNTER — Ambulatory Visit
Admission: RE | Admit: 2023-09-22 | Discharge: 2023-09-22 | Disposition: A | Discharge status: Home / Self Care | Source: Ambulatory Visit | Attending: Nephrology | Admitting: Nephrology

## 2023-09-22 DIAGNOSIS — N1832 Chronic kidney disease, stage 3b: Secondary | ICD-10-CM

## 2023-09-22 DIAGNOSIS — N281 Cyst of kidney, acquired: Secondary | ICD-10-CM | POA: Diagnosis not present

## 2023-10-10 ENCOUNTER — Ambulatory Visit
Admission: RE | Admit: 2023-10-10 | Discharge: 2023-10-10 | Disposition: A | Discharge status: Home / Self Care | Source: Ambulatory Visit | Attending: Internal Medicine | Admitting: Internal Medicine

## 2023-10-10 DIAGNOSIS — M48061 Spinal stenosis, lumbar region without neurogenic claudication: Secondary | ICD-10-CM | POA: Diagnosis not present

## 2023-10-10 DIAGNOSIS — M4726 Other spondylosis with radiculopathy, lumbar region: Secondary | ICD-10-CM | POA: Diagnosis not present

## 2023-10-10 DIAGNOSIS — M5416 Radiculopathy, lumbar region: Secondary | ICD-10-CM

## 2023-10-10 DIAGNOSIS — M5116 Intervertebral disc disorders with radiculopathy, lumbar region: Secondary | ICD-10-CM | POA: Diagnosis not present

## 2023-10-12 ENCOUNTER — Other Ambulatory Visit: Payer: Self-pay | Admitting: Internal Medicine

## 2023-10-12 NOTE — Telephone Encounter (Unsigned)
 Copied from CRM [id number]. Topic: Clinical - Medication Refill >> Oct 12, 2023  4:11 PM Zebedee SAUNDERS wrote: Medication: pantoprazole  (PROTONIX ) 40 MG tablet  Has the patient contacted their pharmacy? Yes (Agent: If no, request that the patient contact the pharmacy for the refill. If patient does not wish to contact the pharmacy document the reason why and proceed with request.) (Agent: If yes, when and what did the pharmacy advise?)  This is the patient's preferred pharmacy:  [facility] [address] [place] - [address] [place] [address] Phone: [phone] Fax: [phone]  Is this the correct pharmacy for this prescription? Yes If no, delete pharmacy and type the correct one.   Has the prescription been filled recently? Yes  Is the patient out of the medication? Yes  Has the patient been seen for an appointment in the last year OR does the patient have an upcoming appointment? Yes  Can we respond through MyChart? Yes  Agent: Please be advised that Rx refills may take up to 3 business days. We ask that you follow-up with your pharmacy.

## 2023-10-13 ENCOUNTER — Telehealth: Payer: Self-pay | Admitting: Radiology

## 2023-10-13 NOTE — Telephone Encounter (Signed)
 Copied from CRM [id number]. Topic: Clinical - Medication Refill >> Oct 12, 2023  4:11 PM Zebedee SAUNDERS wrote: Medication: pantoprazole  (PROTONIX ) 40 MG tablet  Has the patient contacted their pharmacy? Yes (Agent: If no, request that the patient contact the pharmacy for the refill. If patient does not wish to contact the pharmacy document the reason why and proceed with request.) (Agent: If yes, when and what did the pharmacy advise?)  This is the patient's preferred pharmacy:  [facility] [address], [place] - [address] [place] [address] Phone: [phone] Fax: [phone]  Is this the correct pharmacy for this prescription? Yes If no, delete pharmacy and type the correct one.   Has the prescription been filled recently? Yes  Is the patient out of the medication? Yes  Has the patient been seen for an appointment in the last year OR does the patient have an upcoming appointment? Yes  Can we respond through MyChart? Yes  Agent: Please be advised that Rx refills may take up to 3 business days. We ask that you follow-up with your pharmacy. >> Oct 13, 2023  9:15 AM Franky GRADE wrote: Patient is calling to follow up on the refill request, I advised of the 3 day turnaround time. He would like a call once the prescription is sent to his pharmacy.

## 2023-10-14 MED ORDER — PANTOPRAZOLE SODIUM 40 MG PO TBEC: DELAYED_RELEASE_TABLET | ORAL | 3 refills | Status: AC

## 2023-10-15 NOTE — Telephone Encounter (Signed)
 Attempted to call patient but had to leave a voice message for call back. Wanted to inform him that prescription had been placed

## 2023-10-16 ENCOUNTER — Other Ambulatory Visit: Payer: Self-pay | Admitting: Internal Medicine

## 2023-10-16 ENCOUNTER — Ambulatory Visit: Payer: Self-pay | Admitting: Internal Medicine

## 2023-10-16 DIAGNOSIS — M5416 Radiculopathy, lumbar region: Secondary | ICD-10-CM

## 2023-11-10 ENCOUNTER — Telehealth: Payer: Self-pay

## 2023-11-10 DIAGNOSIS — M5416 Radiculopathy, lumbar region: Secondary | ICD-10-CM

## 2023-11-10 NOTE — Telephone Encounter (Signed)
 Copied from CRM [id number]. Topic: Referral - Question >> Nov 10, 2023 10:48 AM Vena HERO wrote: Reason for CRM: pt received referral for back dr in [place] but he is requesting a doctor in [place] due to transportation conflicts plus [place]. Please give  pt an updated call at [phone]

## 2023-11-10 NOTE — Telephone Encounter (Signed)
 Ok this referral is done for NS in [employer]

## 2023-11-10 NOTE — Addendum Note (Signed)
 Addended by: NORLEEN LYNWOOD ORN on: 11/10/2023 04:41 PM   Modules accepted: Orders

## 2023-11-11 ENCOUNTER — Other Ambulatory Visit: Payer: Self-pay

## 2023-11-11 MED ORDER — GABAPENTIN 300 MG PO CAPS: 300.0000 mg | ORAL_CAPSULE | Freq: Two times a day (BID) | ORAL | 1 refills | Status: AC

## 2023-11-11 NOTE — Telephone Encounter (Signed)
 This has been sent in

## 2023-11-11 NOTE — Telephone Encounter (Unsigned)
 Copied from CRM #[id number]. Topic: Clinical - Medication Refill >> Nov 11, 2023 10:52 AM Harlene ORN wrote: Medication: gabapentin  (NEURONTIN ) 300 MG capsule  Has the patient contacted their pharmacy? Yes (Agent: If no, request that the patient contact the pharmacy for the refill. If patient does not wish to contact the pharmacy document the reason why and proceed with request.) (Agent: If yes, when and what did the pharmacy advise?)  This is the patient's preferred pharmacy:  [facility] [address], [place] - [address] [place] [address] Phone: [phone] Fax: [phone]  Is this the correct pharmacy for this prescription? Yes If no, delete pharmacy and type the correct one.   Has the prescription been filled recently? No  Is the patient out of the medication? No  Has the patient been seen for an appointment in the last year OR does the patient have an upcoming appointment? Yes  Can we respond through MyChart? Yes  Agent: Please be advised that Rx refills may take up to 3 business days. We ask that you follow-up with your pharmacy.

## 2023-12-03 DIAGNOSIS — N1832 Chronic kidney disease, stage 3b: Secondary | ICD-10-CM | POA: Diagnosis not present

## 2023-12-04 ENCOUNTER — Ambulatory Visit: Admitting: Internal Medicine

## 2023-12-07 ENCOUNTER — Ambulatory Visit (INDEPENDENT_AMBULATORY_CARE_PROVIDER_SITE_OTHER): Admitting: Internal Medicine

## 2023-12-07 ENCOUNTER — Encounter: Payer: Self-pay | Admitting: Internal Medicine

## 2023-12-07 VITALS — BP 122/80 | HR 64 | Temp 98.2°F | Ht 72.0 in | Wt 290.0 lb

## 2023-12-07 DIAGNOSIS — I10 Essential (primary) hypertension: Secondary | ICD-10-CM

## 2023-12-07 DIAGNOSIS — L738 Other specified follicular disorders: Secondary | ICD-10-CM | POA: Diagnosis not present

## 2023-12-07 DIAGNOSIS — E559 Vitamin D deficiency, unspecified: Secondary | ICD-10-CM

## 2023-12-07 DIAGNOSIS — N1831 Chronic kidney disease, stage 3a: Secondary | ICD-10-CM

## 2023-12-07 DIAGNOSIS — E538 Deficiency of other specified B group vitamins: Secondary | ICD-10-CM

## 2023-12-07 DIAGNOSIS — Z125 Encounter for screening for malignant neoplasm of prostate: Secondary | ICD-10-CM | POA: Diagnosis not present

## 2023-12-07 DIAGNOSIS — R7302 Impaired glucose tolerance (oral): Secondary | ICD-10-CM | POA: Diagnosis not present

## 2023-12-07 DIAGNOSIS — E7800 Pure hypercholesterolemia, unspecified: Secondary | ICD-10-CM | POA: Diagnosis not present

## 2023-12-07 MED ORDER — DOXYCYCLINE HYCLATE 100 MG PO TABS: 100.0000 mg | ORAL_TABLET | Freq: Two times a day (BID) | ORAL | 0 refills | Status: AC

## 2023-12-07 NOTE — Progress Notes (Unsigned)
 Patient ID: Tony Savage, male   DOB: 12/20/1955, 68 y.o.   MRN: [id number]        Chief Complaint: follow up folliculitis, htn, ckd 3a,  hyperglycemia, hld, low b12 and vit d       HPI:  Tony Savage is a 68 y.o. male here overall doing ok, Pt denies chest pain, increased sob or doe, wheezing, orthopnea, PND, increased LE swelling, palpitations, dizziness or syncope.   Pt denies polydipsia, polyuria, or new focal neuro s/s.    Has seen renal initial aug 2025, then had renal labs f/u done last Friday, and OV on Nov 20.  Renal u/s sept 2025 with normal renal cortical thickness and echogenicity.  Does have tender bumps to upper post neck where the barber shaved too close it seems, now with pus bumps.   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   in bilateral hips R>L   Anal fissure    BACK PAIN 01/16/2010   Cataract 12/2020   bilaterl sx   Diverticulosis    ERECTILE DYSFUNCTION 12/31/2006   FEVER UNSPECIFIED 06/08/2007   GERD 09/29/2006   on meds   GLUCOSE INTOLERANCE 12/31/2006   HEMORRHOIDS 09/29/2006   HYPERLIPIDEMIA 09/29/2006   on meds   HYPERSOMNIA 04/30/2009   HYPERTENSION 09/29/2006   on meds   Impaired fasting glucose 01/01/2007   KNEE PAIN, LEFT 10/18/2008   LOW BACK PAIN 12/31/2006   OBESITY 09/29/2006   Peripheral neuropathy    on Gabapentin    Sleep apnea    uses CPAP   SYNCOPE, VASOVAGAL 03/14/2008   VERTEBRAL FRACTURE 09/29/2006   Past Surgical History:  Procedure Laterality Date   COLONOSCOPY  2018   MS-MAC-suprep(good)-no polyps   drainage of recurrent perirectal abceses     fistula-in-ano repair  2010   HEMORRHOID SURGERY      reports that he quit  smoking about 26 years ago. His smoking use included cigarettes. He started smoking about 51 years ago. He has a 37.5 pack-year smoking history. He has never used smokeless tobacco. He reports that he does not currently use alcohol. He reports that he does not use drugs. family history includes Allergies in his sister; Colon cancer (age of onset: 33) in his father; Colon polyps (age of onset: 84) in his brother; Colon polyps (age of onset: 4) in his sister; Colon polyps (age of onset: 31) in his father; Diabetes in his mother; Heart disease in his father; Prostate cancer in his father. No Known Allergies Current Outpatient Medications on File Prior to Visit  Medication Sig Dispense Refill   aspirin 81 MG EC tablet Take 81 mg by mouth daily.     empagliflozin  (JARDIANCE ) 25 MG TABS tablet Take 1 tablet (25 mg total) by mouth daily before breakfast. 90 tablet 3   gabapentin  (NEURONTIN ) 300 MG capsule Take 1 capsule (300 mg total) by mouth 2 (two) times daily. 270 capsule 1   lisinopril -hydrochlorothiazide  (ZESTORETIC ) 20-12.5 MG tablet TAKE 2 TABLETS BY MOUTH DAILY 180 tablet 3   pantoprazole  (PROTONIX ) 40 MG tablet TAKE 1 TABLET(40 MG) BY MOUTH TWICE DAILY 180 tablet 3  predniSONE  (DELTASONE ) 10 MG tablet 3 tabs by mouth per day for 3 days,2tabs per day for 3 days,1tab per day for 3 days 18 tablet 0   rosuvastatin  (CRESTOR ) 20 MG tablet Take 1 tablet (20 mg total) by mouth daily. 90 tablet 3   traMADol  (ULTRAM ) 50 MG tablet Take 1 tablet (50 mg total) by mouth every 6 (six) hours as needed. 30 tablet 2   VITAMIN D  PO Take 1 tablet by mouth daily at 6 (six) AM.     No current facility-administered medications on file prior to visit.        ROS:  All others reviewed and negative.  Objective        PE:  BP 122/80 (BP Location: Right Arm, Patient Position: Sitting, Cuff Size: Normal)   Pulse 64   Temp 98.2 F (36.8 C) (Oral)   Ht 6' (1.829 m)   Wt 290 lb (131.5 kg)   SpO2 96%   BMI 39.33  kg/m                 Constitutional: Pt appears in NAD               HENT: Head: NCAT.                Right Ear: External ear normal.                 Left Ear: External ear normal.                Eyes: . Pupils are equal, round, and reactive to light. Conjunctivae and EOM are normal               Nose: without d/c or deformity               Neck: Neck supple. Gross normal ROM               Cardiovascular: Normal rate and regular rhythm.                 Pulmonary/Chest: Effort normal and breath sounds without rales or wheezing.                Abd:  Soft, NT, ND, + BS, no organomegaly               Neurological: Pt is alert. At baseline orientation, motor grossly intact               Skin: Skin is warm. LE edema - none, post neck with hairline numerous small raised tender bumps, several with pus               Psychiatric: Pt behavior is normal without agitation   Micro: none  Cardiac tracings I have personally interpreted today:  none  Pertinent Radiological findings (summarize): none   Lab Results  Component Value Date   WBC 5.7 03/24/2023   HGB 10.8 (L) 03/24/2023   HCT 32.8 (L) 03/24/2023   PLT 190.0 03/24/2023   GLUCOSE 97 03/24/2023   CHOL 134 03/24/2023   TRIG 47.0 03/24/2023   HDL 62.50 03/24/2023   LDLDIRECT 134.8 04/29/2011   LDLCALC 62 03/24/2023   ALT 13 03/24/2023   AST 24 03/24/2023   NA 140 03/24/2023   K 4.8 03/24/2023   CL 104 03/24/2023   CREATININE 1.94 (H) 03/24/2023   BUN 35 (H) 03/24/2023   CO2 27 03/24/2023   TSH 3.58 03/24/2023   PSA 3.43 03/24/2023  HGBA1C 5.0 03/24/2023   MICROALBUR <0.7 03/24/2023   Assessment/Plan:  Tony Savage is a 68 y.o. Black or African American [2] male with  has a past medical history of ABSCESS, GLUTEAL (06/14/2009), Acute gouty arthropathy (10/18/2008), Anal fissure, BACK PAIN (01/16/2010), Cataract (12/2020), Diverticulosis, ERECTILE DYSFUNCTION (12/31/2006), FEVER UNSPECIFIED (06/08/2007), GERD (09/29/2006),  GLUCOSE INTOLERANCE (12/31/2006), HEMORRHOIDS (09/29/2006), HYPERLIPIDEMIA (09/29/2006), HYPERSOMNIA (04/30/2009), HYPERTENSION (09/29/2006), Impaired fasting glucose (01/01/2007), KNEE PAIN, LEFT (10/18/2008), LOW BACK PAIN (12/31/2006), OBESITY (09/29/2006), Peripheral neuropathy, Sleep apnea, SYNCOPE, VASOVAGAL (03/14/2008), and VERTEBRAL FRACTURE (09/29/2006).  Vitamin D  deficiency Last vitamin D  Lab Results  Component Value Date   VD25OH 55.55 03/24/2023   Stable, cont oral replacement   Impaired glucose tolerance Lab Results  Component Value Date   HGBA1C 5.0 03/24/2023   Stable, pt to continue current medical treatment jardiance  25 qd   Hyperlipidemia Lab Results  Component Value Date   LDLCALC 62 03/24/2023   Stable, pt to continue current statin crestor  20 mg qd   B12 deficiency Lab Results  Component Value Date   VITAMINB12 229 03/24/2023   Low, to start oral replacement - b12 1000 mcg qd   CKD (chronic kidney disease) stage 3, GFR 30-59 ml/min (HCC) Lab Results  Component Value Date   CREATININE 1.94 (H) 03/24/2023   Stable overall, cont to avoid nephrotoxins, f/u renal as planned next wk after labs done yesterday  Folliculitis barbae Post neck at hairline, now for doxycycline  100 bid course, and avoid shaving too closely  Essential hypertension BP Readings from Last 3 Encounters:  12/07/23 122/80  09/21/23 100/64  03/24/23 (!) 144/82   Stable, pt to continue medical treatment zestoretic  20 12.5 - 2 qd  Followup: Return in about 6 months (around 06/05/2024).  Lynwood Rush, MD 12/08/2023 5:53 AM [facility]

## 2023-12-07 NOTE — Patient Instructions (Signed)
 Please take all new medication as prescribed  - the antibiotic  Please continue all other medications as before, and refills have been done if requested.  Please have the pharmacy call with any other refills you may need.  Please continue your efforts at being more active, low cholesterol diet, and weight control.  Please keep your appointments with your specialists as you may have planned - kidney doctor soon  We can hold on more lab tests today  Please make an Appointment to return in 6 months, or sooner if needed

## 2023-12-08 ENCOUNTER — Encounter: Payer: Self-pay | Admitting: Internal Medicine

## 2023-12-08 DIAGNOSIS — L738 Other specified follicular disorders: Secondary | ICD-10-CM | POA: Insufficient documentation

## 2023-12-08 NOTE — Assessment & Plan Note (Signed)
 Lab Results  Component Value Date   VITAMINB12 229 03/24/2023   Low, to start  oral replacement - b12 1000 mcg qd

## 2023-12-08 NOTE — Assessment & Plan Note (Signed)
 Lab Results  Component Value Date   CREATININE 1.94 (H) 03/24/2023   Stable overall, cont to avoid nephrotoxins, f/u renal as planned next wk after labs done yesterday

## 2023-12-08 NOTE — Assessment & Plan Note (Signed)
 Lab Results  Component Value Date   HGBA1C 5.0 03/24/2023   Stable, pt to continue current medical treatment jardiance  25 qd

## 2023-12-08 NOTE — Assessment & Plan Note (Signed)
 Last vitamin D  Lab Results  Component Value Date   VD25OH 55.55 03/24/2023   Stable, cont oral replacement

## 2023-12-08 NOTE — Assessment & Plan Note (Signed)
 Post neck at hairline, now for doxycycline  100 bid course, and avoid shaving too closely

## 2023-12-08 NOTE — Assessment & Plan Note (Signed)
 Lab Results  Component Value Date   LDLCALC 62 03/24/2023   Stable, pt to continue current statin crestor  20 mg qd

## 2023-12-08 NOTE — Assessment & Plan Note (Signed)
 BP Readings from Last 3 Encounters:  12/07/23 122/80  09/21/23 100/64  03/24/23 (!) 144/82   Stable, pt to continue medical treatment zestoretic  20 12.5 - 2 qd

## 2023-12-17 DIAGNOSIS — E559 Vitamin D deficiency, unspecified: Secondary | ICD-10-CM | POA: Diagnosis not present

## 2023-12-17 DIAGNOSIS — D631 Anemia in chronic kidney disease: Secondary | ICD-10-CM | POA: Diagnosis not present

## 2023-12-17 DIAGNOSIS — N1832 Chronic kidney disease, stage 3b: Secondary | ICD-10-CM | POA: Diagnosis not present

## 2023-12-17 DIAGNOSIS — I129 Hypertensive chronic kidney disease with stage 1 through stage 4 chronic kidney disease, or unspecified chronic kidney disease: Secondary | ICD-10-CM | POA: Diagnosis not present

## 2023-12-29 ENCOUNTER — Telehealth: Payer: Self-pay

## 2023-12-29 MED ORDER — FENOFIBRATE 160 MG PO TABS: 160.0000 mg | ORAL_TABLET | Freq: Every day | ORAL | 3 refills | Status: AC

## 2023-12-29 NOTE — Telephone Encounter (Signed)
 Copied from CRM #[id number]. Topic: Clinical - Prescription Issue >> Dec 29, 2023 11:43 AM Rea ORN wrote: Reason for CRM: pt stated nephrologist took him off of fenofibrate  160 MG. Recently the nephrologist told him he could start taking it again. Pharmacy sent request for refill but it was denied by PCP. Please advise if he can start taking this rx again.  Please call back, [phone]

## 2023-12-29 NOTE — Telephone Encounter (Signed)
 Ok this restart is done

## 2023-12-29 NOTE — Addendum Note (Signed)
 Addended by: NORLEEN LYNWOOD ORN on: 12/29/2023 12:51 PM   Modules accepted: Orders

## 2024-02-09 ENCOUNTER — Telehealth: Payer: Self-pay

## 2024-02-09 NOTE — Telephone Encounter (Signed)
 Copied from CRM [id number]. Topic: Appointments - Transfer of Care >> Feb 09, 2024  4:14 PM Geneva B wrote: Pt is requesting to transfer FROM: lynwood rush Pt is requesting to transfer TO: theophilus katos Reason for requested transfer: provider not active  It is the responsibility of the team the patient would like to transfer to (Dr. silva) to reach out to the patient if for any reason this transfer is not acceptable.

## 2024-02-17 ENCOUNTER — Telehealth: Payer: Self-pay

## 2024-02-17 NOTE — Telephone Encounter (Signed)
 Copied from CRM #[id number]. Topic: Clinical - Prescription Issue >> Feb 17, 2024  9:51 AM Robinson H wrote: Reason for CRM: Patient states he was advised by pharmacy that his refill request for the rosuvastatin  (CRESTOR ) 20 MG tablet was denied, no denial reason linked to medication. Medication does show discontinued by provider on 03/24/23. Patient is almost out of medication and wanted to get his medications filled before provider leaves.  Reed [phone]

## 2024-02-19 ENCOUNTER — Other Ambulatory Visit: Payer: Self-pay

## 2024-02-19 MED ORDER — ROSUVASTATIN CALCIUM 20 MG PO TABS: 20.0000 mg | ORAL_TABLET | Freq: Every day | ORAL | 3 refills | Status: AC

## 2024-02-19 NOTE — Telephone Encounter (Signed)
 Refill has been sent.

## 2024-03-17 ENCOUNTER — Encounter: Admitting: Family Medicine
